# Patient Record
Sex: Female | Born: 1980 | Race: White | Hispanic: No | Marital: Married | Smoking: Current every day smoker
Health system: Southern US, Community
[De-identification: ages and names within clinical notes are randomized; demographics above are authoritative.]

## PROBLEM LIST (undated history)

## (undated) DIAGNOSIS — F32A Depression, unspecified: Secondary | ICD-10-CM

## (undated) DIAGNOSIS — T7840XA Allergy, unspecified, initial encounter: Secondary | ICD-10-CM

## (undated) DIAGNOSIS — K743 Primary biliary cirrhosis: Secondary | ICD-10-CM

## (undated) DIAGNOSIS — F200 Paranoid schizophrenia: Secondary | ICD-10-CM

## (undated) DIAGNOSIS — M358 Other specified systemic involvement of connective tissue: Secondary | ICD-10-CM

## (undated) DIAGNOSIS — G709 Myoneural disorder, unspecified: Secondary | ICD-10-CM

## (undated) DIAGNOSIS — L940 Localized scleroderma [morphea]: Secondary | ICD-10-CM

## (undated) DIAGNOSIS — F4310 Post-traumatic stress disorder, unspecified: Secondary | ICD-10-CM

## (undated) DIAGNOSIS — K219 Gastro-esophageal reflux disease without esophagitis: Secondary | ICD-10-CM

## (undated) DIAGNOSIS — F1010 Alcohol abuse, uncomplicated: Secondary | ICD-10-CM

## (undated) DIAGNOSIS — F319 Bipolar disorder, unspecified: Secondary | ICD-10-CM

## (undated) HISTORY — DX: Myoneural disorder, unspecified: G70.9

## (undated) HISTORY — PX: APPENDECTOMY: SHX54

## (undated) HISTORY — PX: HERNIA REPAIR: SHX51

## (undated) HISTORY — DX: Depression, unspecified: F32.A

## (undated) HISTORY — DX: Paranoid schizophrenia: F20.0

## (undated) HISTORY — PX: PARTIAL HYSTERECTOMY: SHX80

## (undated) HISTORY — DX: Allergy, unspecified, initial encounter: T78.40XA

## (undated) HISTORY — PX: ROUX-EN-Y GASTRIC BYPASS: SHX1104

---

## 2018-11-04 ENCOUNTER — Encounter: Payer: Self-pay | Admitting: Intensive Care

## 2018-11-04 ENCOUNTER — Emergency Department: Payer: Medicaid - Out of State

## 2018-11-04 ENCOUNTER — Other Ambulatory Visit: Payer: Self-pay

## 2018-11-04 ENCOUNTER — Emergency Department
Admission: EM | Admit: 2018-11-04 | Discharge: 2018-11-04 | Disposition: A | Discharge status: Home / Self Care | Payer: Medicaid - Out of State | Attending: Emergency Medicine | Admitting: Emergency Medicine

## 2018-11-04 DIAGNOSIS — F17210 Nicotine dependence, cigarettes, uncomplicated: Secondary | ICD-10-CM | POA: Diagnosis not present

## 2018-11-04 DIAGNOSIS — R1903 Right lower quadrant abdominal swelling, mass and lump: Secondary | ICD-10-CM | POA: Insufficient documentation

## 2018-11-04 DIAGNOSIS — R1031 Right lower quadrant pain: Secondary | ICD-10-CM | POA: Diagnosis present

## 2018-11-04 HISTORY — DX: Localized scleroderma (morphea): L94.0

## 2018-11-04 HISTORY — DX: Post-traumatic stress disorder, unspecified: F43.10

## 2018-11-04 HISTORY — DX: Bipolar disorder, unspecified: F31.9

## 2018-11-04 HISTORY — DX: Primary biliary cirrhosis: K74.3

## 2018-11-04 HISTORY — DX: Other specified systemic involvement of connective tissue: M35.8

## 2018-11-04 LAB — CBC WITH DIFFERENTIAL/PLATELET
Abs Immature Granulocytes: 0.02 10*3/uL (ref 0.00–0.07)
Basophils Absolute: 0.1 10*3/uL (ref 0.0–0.1)
Basophils Relative: 1 %
Eosinophils Absolute: 0.1 10*3/uL (ref 0.0–0.5)
Eosinophils Relative: 2 %
HCT: 41.6 % (ref 36.0–46.0)
Hemoglobin: 14.3 g/dL (ref 12.0–15.0)
Immature Granulocytes: 0 %
Lymphocytes Relative: 33 %
Lymphs Abs: 2.2 10*3/uL (ref 0.7–4.0)
MCH: 30.5 pg (ref 26.0–34.0)
MCHC: 34.4 g/dL (ref 30.0–36.0)
MCV: 88.7 fL (ref 80.0–100.0)
MONOS PCT: 9 %
Monocytes Absolute: 0.6 10*3/uL (ref 0.1–1.0)
Neutro Abs: 3.7 10*3/uL (ref 1.7–7.7)
Neutrophils Relative %: 55 %
Platelets: 247 10*3/uL (ref 150–400)
RBC: 4.69 MIL/uL (ref 3.87–5.11)
RDW: 12.2 % (ref 11.5–15.5)
WBC: 6.7 10*3/uL (ref 4.0–10.5)
nRBC: 0 % (ref 0.0–0.2)

## 2018-11-04 LAB — BASIC METABOLIC PANEL
Anion gap: 8 (ref 5–15)
BUN: 11 mg/dL (ref 6–20)
CO2: 25 mmol/L (ref 22–32)
Calcium: 8.5 mg/dL — ABNORMAL LOW (ref 8.9–10.3)
Chloride: 106 mmol/L (ref 98–111)
Creatinine, Ser: 0.65 mg/dL (ref 0.44–1.00)
GFR calc Af Amer: >60 mL/min (ref >60)
GLUCOSE: 96 mg/dL (ref 70–99)
Potassium: 4.5 mmol/L (ref 3.5–5.1)
Sodium: 139 mmol/L (ref 135–145)

## 2018-11-04 MED ORDER — IOPAMIDOL (ISOVUE-300) INJECTION 61%
100.0000 mL | Freq: Once | INTRAVENOUS | Status: AC | PRN
  Administered 2018-11-04: 100 mL via INTRAVENOUS
  Filled 2018-11-04: qty 100

## 2018-11-04 MED ORDER — ACETAMINOPHEN-CODEINE #3 300-30 MG PO TABS
1.0000 | ORAL_TABLET | Freq: Once | ORAL | Status: AC
  Administered 2018-11-04: 1 via ORAL
  Filled 2018-11-04: qty 1

## 2018-11-04 MED ORDER — CYCLOBENZAPRINE HCL 10 MG PO TABS: 10.0000 mg | ORAL_TABLET | Freq: Three times a day (TID) | ORAL | 0 refills | Status: DC | PRN

## 2018-11-04 MED ORDER — KETOROLAC TROMETHAMINE 30 MG/ML IJ SOLN
30.0000 mg | Freq: Once | INTRAMUSCULAR | Status: AC
  Administered 2018-11-04: 30 mg via INTRAVENOUS
  Filled 2018-11-04: qty 1

## 2018-11-04 MED ORDER — ACETAMINOPHEN-CODEINE #3 300-30 MG PO TABS: 1.0000 | ORAL_TABLET | Freq: Four times a day (QID) | ORAL | 0 refills | Status: DC | PRN

## 2018-11-04 NOTE — Discharge Instructions (Addendum)
The CT scan of your lower abdomen shows a calcification from your previous abdominal surgeries.   You should take flexeril, tylenol #3 as prescribed. You may also take regular tylenol instead of tylenol #3.  Follow up with primary care provider of your choice if not improving over the next few days.

## 2018-11-04 NOTE — ED Provider Notes (Signed)
[facility] Emergency Department Provider Note ____________________________________________  Time seen: Approximately 3:35 PM  I have reviewed the triage vital signs and the nursing notes.   HISTORY  Chief Complaint Groin Pain (right side)    HPI Katrina Turner is a 37 y.o. female who presents to the emergency department for evaluation and treatment of right lower abdominal pain x 2 months. She states that she often wears a backpack with a strap around her waist. She has noticed a "lump" in the right lower abdomen that has worsened over the past couple of days. No alleviating measures attempted prior to arrival.   Past Medical History:  Diagnosis Date  . Bipolar 1 disorder (HCC)   . PTSD (post-traumatic stress disorder)   . 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   History reviewed. No pertinent surgical history.  Prior to Admission medications   Medication Sig Start Date End Date Taking? Authorizing Provider  acetaminophen-codeine (TYLENOL #3) 300-30 MG tablet Take 1 tablet by mouth every 6 (six) hours as needed for moderate pain. 11/04/18   Tafari Humiston, Rulon Eisenmengerari B, FNP  cyclobenzaprine (FLEXERIL) 10 MG tablet Take 1 tablet (10 mg total) by mouth 3 (three) times daily as needed for muscle spasms. 11/04/18   Esty Ahuja, Rulon Eisenmengerari B, FNP    Allergies Depakote er [divalproex sodium er]; Nsaids; and Tegretol [carbamazepine]  History reviewed. No pertinent family history.  Social History Social History   Tobacco Use  . Smoking status: Current Every Day Smoker    Types: Cigarettes  . Smokeless tobacco: Never Used  Substance Use Topics  . Alcohol use: Yes    Comment: weekends  . Drug use: Never    Review of Systems Constitutional: Negative for fever. Cardiovascular: Negative for chest pain. Respiratory: Negative for shortness of breath. Musculoskeletal: Negative for extremity pain. Skin: Intact with old scar  across area of pain.  Neurological: Negative for decrease in sensation  ____________________________________________   PHYSICAL EXAM:  VITAL SIGNS: ED Triage Vitals [11/04/18 1452]  Enc Vitals Group     BP 123/84     Pulse Rate (!) 109     Resp 16     Temp 98.1 F (36.7 C)     Temp Source Oral     SpO2 98 %     Weight 190 lb (86.2 kg)     Height 5\' 7"  (1.702 m)     Head Circumference      Peak Flow      Pain Score 8     Pain Loc      Pain Edu?      Excl. in GC?     Constitutional: Alert and oriented. Well appearing and in no acute distress. Eyes: Conjunctivae are clear without discharge or drainage Head: Atraumatic Neck: Supple Respiratory: No cough. Respirations are even and unlabored. Musculoskeletal: FROM of all extremities observed. Neurologic: Awake, alert, oriented. Ambulatory without assistance.  Skin: Abdominal scar across right lower abdomen with nodular, non-mobile mass.  Psychiatric: Affect and behavior are appropriate.  ____________________________________________   LABS (all labs ordered are listed, but only abnormal results are displayed)  Labs Reviewed  BASIC METABOLIC PANEL - Abnormal; Notable for the following components:      Result Value   Calcium 8.5 (*)    All other components within normal limits  CBC WITH DIFFERENTIAL/PLATELET   ____________________________________________  RADIOLOGY  US non conclusive.   CT abdomen shows a nonspecific subcutaneous calcification  in the anterior pelvic wall, right of midline. ____________________________________________   PROCEDURES  Procedures  ____________________________________________   INITIAL IMPRESSION / ASSESSMENT AND PLAN / ED COURSE  Katrina Turner is a 37 y.o. who presents to the emergency department for who presents to the emergency department for treatment and evaluation of lower abdominal pain. 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 Plan to do [place] for confirmation.  ----------------------------------------- 4:40 PM on 11/04/2018 -----------------------------------------  [place] inconclusive. Will do CT. Patient and family aware and agree to the plan.  ----------------------------------------- 6:02 PM on 11/04/2018 -----------------------------------------  Results reviewed with the patient and family.  She will be prescribed Tylenol 3 and Flexeril.  She was encouraged to follow-up with primary care provider of her choice if symptoms are not improving over the next few days.  She is to return to the emergency department for symptoms change or worsen if unable to schedule an appointment.  Medications  ketorolac (TORADOL) 30 MG/ML injection 30 mg (30 mg Intravenous Given 11/04/18 1701)  iopamidol (ISOVUE-300) 61 % injection 100 mL (100 mLs Intravenous Contrast Given 11/04/18 1722)  acetaminophen-codeine (TYLENOL #3) 300-30 MG per tablet 1 tablet (1 tablet Oral Given 11/04/18 1758)    Pertinent labs & imaging results that were available during my care of the patient were reviewed by me and considered in my medical decision making (see chart for details).  _________________________________________   FINAL CLINICAL IMPRESSION(S) / ED DIAGNOSES  Final diagnoses:  Abdominal wall mass of right lower quadrant    ED Discharge Orders         Ordered    acetaminophen-codeine (TYLENOL #3) 300-30 MG tablet  Every 6 hours PRN     11/04/18 1757    cyclobenzaprine (FLEXERIL) 10 MG tablet  3 times daily PRN     11/04/18 1757           If controlled substance prescribed during this visit, 12 month history viewed on the [facility] prior to issuing an initial prescription for Schedule II or III opiod.    Chinita Pester, FNP 11/04/18 1803    Arnaldo Natal, MD 11/04/18 [phone]

## 2018-11-04 NOTE — ED Triage Notes (Signed)
Patient c/o Right sided groin pain X2 days that started while hiking.

## 2018-11-04 NOTE — ED Notes (Signed)
Pt presents with lower abdominal pain x 2 months. She states that she was hiking from [place] and picked up a backpack and it felt like something "tore." Pt describes pain as burning. NAD noted.

## 2018-11-04 NOTE — ED Notes (Signed)
Pt discharged home after verbalizing understanding of discharge instructions; nad noted. 

## 2018-11-22 ENCOUNTER — Encounter: Payer: Self-pay | Admitting: Emergency Medicine

## 2018-11-22 ENCOUNTER — Emergency Department
Admission: EM | Admit: 2018-11-22 | Discharge: 2018-11-22 | Disposition: A | Discharge status: Home / Self Care | Payer: PRIVATE HEALTH INSURANCE | Attending: Emergency Medicine | Admitting: Emergency Medicine

## 2018-11-22 ENCOUNTER — Emergency Department: Payer: PRIVATE HEALTH INSURANCE

## 2018-11-22 ENCOUNTER — Other Ambulatory Visit: Payer: Self-pay

## 2018-11-22 DIAGNOSIS — F17210 Nicotine dependence, cigarettes, uncomplicated: Secondary | ICD-10-CM | POA: Insufficient documentation

## 2018-11-22 DIAGNOSIS — J101 Influenza due to other identified influenza virus with other respiratory manifestations: Secondary | ICD-10-CM

## 2018-11-22 DIAGNOSIS — J09X2 Influenza due to identified novel influenza A virus with other respiratory manifestations: Secondary | ICD-10-CM | POA: Insufficient documentation

## 2018-11-22 DIAGNOSIS — Z79899 Other long term (current) drug therapy: Secondary | ICD-10-CM | POA: Diagnosis not present

## 2018-11-22 DIAGNOSIS — R509 Fever, unspecified: Secondary | ICD-10-CM | POA: Diagnosis present

## 2018-11-22 LAB — INFLUENZA PANEL BY PCR (TYPE A & B)
Influenza A By PCR: POSITIVE — AB
Influenza B By PCR: NEGATIVE

## 2018-11-22 MED ORDER — OSELTAMIVIR PHOSPHATE 75 MG PO CAPS: 75.0000 mg | ORAL_CAPSULE | Freq: Two times a day (BID) | ORAL | 0 refills | Status: AC

## 2018-11-22 MED ORDER — FLUTICASONE PROPIONATE 50 MCG/ACT NA SUSP: 2.0000 | Freq: Every day | NASAL | 0 refills | Status: DC

## 2018-11-22 MED ORDER — BENZONATATE 100 MG PO CAPS: ORAL_CAPSULE | ORAL | 0 refills | Status: DC

## 2018-11-22 NOTE — ED Triage Notes (Signed)
Fever, cough, diarrhea x 3 days.  Last medicated with tylenol at 0800.

## 2018-11-22 NOTE — Discharge Instructions (Signed)
You have tested positive for the flu. Take the prescription meds as directed. Take OTC Delsym for additional cough relief. Take Tylenol and Motrin for fever and body pain relief.

## 2018-11-22 NOTE — ED Notes (Signed)
See triage note  Presents with low grade fever,body aches diarrhea and drainage from eye  States she developed sx's on Friday and now things are getting worse

## 2018-11-23 NOTE — ED Provider Notes (Signed)
[facility] Emergency Department Provider Note ____________________________________________  Time seen: 1455  I have reviewed the triage vital signs and the nursing notes.  HISTORY  Chief Complaint  Fever and Cough  HPI Katrina Turner is a 37 y.o. female presents herself to the ED for evaluation of 3-day complaint of fever, cough, and diarrhea.  He has taken Tylenol for her symptoms but denies any overall relief.  She also reports generalized fatigue and body aches.  She is unclear of any sick contacts, recent travel, or other exposures.  She did not receive the seasonal flu vaccine.  Past Medical History:  Diagnosis Date  . Bipolar 1 disorder (HCC)   . PTSD (post-traumatic stress disorder)   . 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   History reviewed. No pertinent surgical history.  Prior to Admission medications   Medication Sig Start Date End Date Taking? Authorizing Provider  acetaminophen-codeine (TYLENOL #3) 300-30 MG tablet Take 1 tablet by mouth every 6 (six) hours as needed for moderate pain. 11/04/18   Chinita Pesterriplett, Cari B, FNP  benzonatate (TESSALON PERLES) 100 MG capsule Take 1-2 tabs TID prn cough 11/22/18   Vedika Dumlao, Charlesetta IvoryJenise V Bacon, PA-C  cyclobenzaprine (FLEXERIL) 10 MG tablet Take 1 tablet (10 mg total) by mouth 3 (three) times daily as needed for muscle spasms. 11/04/18   Triplett, Cari B, FNP  fluticasone (FLONASE) 50 MCG/ACT nasal spray Place 2 sprays into both nostrils daily. 11/22/18   Filomeno Cromley, Charlesetta IvoryJenise V Bacon, PA-C  oseltamivir (TAMIFLU) 75 MG capsule Take 1 capsule (75 mg total) by mouth 2 (two) times daily for 5 days. 11/22/18 11/27/18  Gael Delude, Charlesetta IvoryJenise V Bacon, PA-C    Allergies Depakote er [divalproex sodium er]; Nsaids; and Tegretol [carbamazepine]  No family history on file.  Social History Social History   Tobacco Use  . Smoking status: Current Every Day Smoker    Types: Cigarettes   . Smokeless tobacco: Never Used  Substance Use Topics  . Alcohol use: Yes    Comment: weekends  . Drug use: Never    Review of Systems  Constitutional: Positive for fever. Eyes: Negative for visual changes. ENT: Negative for sore throat. Cardiovascular: Negative for chest pain. Respiratory: Negative for shortness of breath. Gastrointestinal: Negative for abdominal pain, vomiting. Reports diarrhea. Genitourinary: Negative for dysuria. Musculoskeletal: Negative for back pain. Reports bodyaches.  Skin: Negative for rash. Neurological: Negative for headaches, focal weakness or numbness. ____________________________________________  PHYSICAL EXAM:  VITAL SIGNS: ED Triage Vitals  Enc Vitals Group     BP 11/22/18 1209 (!) 142/83     Pulse Rate 11/22/18 1209 92     Resp 11/22/18 1209 18     Temp 11/22/18 1209 99.3 F (37.4 C)     Temp Source 11/22/18 1209 Oral     SpO2 11/22/18 1209 94 %     Weight 11/22/18 1202 189 lb 9.5 oz (86 kg)     Height 11/22/18 1202 5\' 7"  (1.702 m)     Head Circumference --      Peak Flow --      Pain Score 11/22/18 1202 8     Pain Loc --      Pain Edu? --      Excl. in GC? --     Constitutional: Alert and oriented. Well appearing and in no distress. Head: Normocephalic and atraumatic. Eyes: Conjunctivae are normal. Normal extraocular movements Ears: Canals clear. TMs intact bilaterally.  Nose: No congestion/rhinorrhea/epistaxis. Mouth/Throat: Mucous membranes are moist. Neck: Supple. No thyromegaly. Cardiovascular: Normal rate, regular rhythm. Normal distal pulses. Respiratory: Normal respiratory effort. No wheezes/rales/rhonchi. Gastrointestinal: Soft and nontender. No distention. Musculoskeletal: Nontender with normal range of motion in all extremities.  Neurologic:  Normal gait without ataxia. Normal speech and language. No gross focal neurologic deficits are appreciated. ____________________________________________   LABS (pertinent  positives/negatives) Labs Reviewed  INFLUENZA PANEL BY PCR (TYPE A & B) - Abnormal; Notable for the following components:      Result Value   Influenza A By PCR POSITIVE (*)    All other components within normal limits  ____________________________________________   RADIOLOGY  CXR IMPRESSION: No active cardiopulmonary disease. ____________________________________________  PROCEDURES  Procedures ____________________________________________  INITIAL IMPRESSION / ASSESSMENT AND PLAN / ED COURSE  Patient with ED evaluation of symptoms concerning for influenza.  Her influenza screen is positive for influenza A.  She is treated with Tessalon Perles, Flonase, and Tamiflu.  She will follow-up with primary provider or return to the ED as needed. ____________________________________________  FINAL CLINICAL IMPRESSION(S) / ED DIAGNOSES  Final diagnoses:  Influenza A      Karmen Stabs, Charlesetta Ivory, PA-C 11/23/18 1856    Don Perking, [place], MD 11/25/18 1459

## 2018-11-29 ENCOUNTER — Emergency Department
Admission: EM | Admit: 2018-11-29 | Discharge: 2018-11-29 | Disposition: A | Discharge status: Home / Self Care | Payer: PRIVATE HEALTH INSURANCE | Attending: Emergency Medicine | Admitting: Emergency Medicine

## 2018-11-29 ENCOUNTER — Other Ambulatory Visit: Payer: Self-pay

## 2018-11-29 DIAGNOSIS — M79642 Pain in left hand: Secondary | ICD-10-CM | POA: Diagnosis not present

## 2018-11-29 DIAGNOSIS — F17210 Nicotine dependence, cigarettes, uncomplicated: Secondary | ICD-10-CM | POA: Diagnosis not present

## 2018-11-29 DIAGNOSIS — M79641 Pain in right hand: Secondary | ICD-10-CM | POA: Diagnosis present

## 2018-11-29 DIAGNOSIS — I7300 Raynaud's syndrome without gangrene: Secondary | ICD-10-CM

## 2018-11-29 MED ORDER — AMLODIPINE BESYLATE 5 MG PO TABS: 5.0000 mg | ORAL_TABLET | Freq: Every day | ORAL | 2 refills | Status: DC

## 2018-11-29 MED ORDER — AMLODIPINE BESYLATE 5 MG PO TABS
5.0000 mg | ORAL_TABLET | Freq: Once | ORAL | Status: AC
  Administered 2018-11-29: 5 mg via ORAL
  Filled 2018-11-29: qty 1

## 2018-11-29 NOTE — ED Notes (Signed)
FIRST NURSE NOTE: PT reports numbness to hands, pt has Raynaud's, pt tearful in triage pt states she has Lupus.

## 2018-11-29 NOTE — ED Notes (Signed)
See triage note. Pt states both hands constricted. Bilat pulse 2+.

## 2018-11-29 NOTE — Discharge Instructions (Addendum)
[facility] if you remain in [place].  Return to any emergency department if worsening.  Take the medications as prescribed.

## 2018-11-29 NOTE — ED Triage Notes (Addendum)
Pt states she has raynauds and has been having BL hand cramps since 10am today.

## 2018-11-29 NOTE — ED Provider Notes (Signed)
[facility] Emergency Department Provider Note  ____________________________________________   First MD Initiated Contact with Patient 11/29/18 1537     (approximate)  I have reviewed the triage vital signs and the nursing notes.   HISTORY  Chief Complaint Hand Pain    HPI Katrina Turner is a 37 y.o. female since emergency department complaining of bilateral hand pain due to her rainouts.  She states she has been hiking all the way from [place].  She has been seen here multiple times.  She is homeless.  She states her hands are constricted today.    Past Medical History:  Diagnosis Date  . Bipolar 1 disorder (HCC)   . PTSD (post-traumatic stress disorder)   . 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   History reviewed. No pertinent surgical history.  Prior to Admission medications   Medication Sig Start Date End Date Taking? Authorizing Provider  amLODipine (NORVASC) 5 MG tablet Take 1 tablet (5 mg total) by mouth daily. 11/29/18   Sherrie MustacheFisher, Roselyn BeringSusan W, PA-C  benzonatate (TESSALON PERLES) 100 MG capsule Take 1-2 tabs TID prn cough 11/22/18   Menshew, Charlesetta IvoryJenise V Bacon, PA-C  fluticasone (FLONASE) 50 MCG/ACT nasal spray Place 2 sprays into both nostrils daily. 11/22/18   Menshew, Charlesetta IvoryJenise V Bacon, PA-C    Allergies Depakote er [divalproex sodium er]; Nsaids; and Tegretol [carbamazepine]  No family history on file.  Social History Social History   Tobacco Use  . Smoking status: Current Every Day Smoker    Types: Cigarettes  . Smokeless tobacco: Never Used  Substance Use Topics  . Alcohol use: Yes    Comment: weekends  . Drug use: Never    Review of Systems  Constitutional: No fever/chills Eyes: No visual changes. ENT: No sore throat. Respiratory: Denies cough Genitourinary: Negative for dysuria. Musculoskeletal: Negative for back pain.  Bilateral hand pain Skin: Negative for  rash.    ____________________________________________   PHYSICAL EXAM:  VITAL SIGNS: ED Triage Vitals [11/29/18 1525]  Enc Vitals Group     BP 131/86     Pulse Rate 79     Resp 18     Temp 97.7 F (36.5 C)     Temp Source Oral     SpO2 99 %     Weight 180 lb (81.6 kg)     Height 5\' 7"  (1.702 m)     Head Circumference      Peak Flow      Pain Score 10     Pain Loc      Pain Edu?      Excl. in GC?     Constitutional: Alert and oriented. Well appearing and in no acute distress. Eyes: Conjunctivae are normal.  Head: Atraumatic. Nose: No congestion/rhinnorhea. Mouth/Throat: Mucous membranes are moist.   Neck:  supple no lymphadenopathy noted Cardiovascular: Normal rate, regular rhythm. Heart sounds are normal Respiratory: Normal respiratory effort.  No retractions, lungs c t a  GU: deferred Musculoskeletal: FROM all extremities, warm and well perfused, hands are red in the fingertips feel coldness typical of raynauds Neurologic:  Normal speech and language.  Skin:  Skin is warm, dry and intact. No rash noted. Psychiatric: Mood and affect are normal. Speech and behavior are normal.  ____________________________________________   LABS (all labs ordered are listed, but only abnormal results are displayed)  Labs Reviewed - No data to display ____________________________________________   ____________________________________________  RADIOLOGY  ____________________________________________   PROCEDURES  Procedure(s) performed: No  Procedures    ____________________________________________   INITIAL IMPRESSION / ASSESSMENT AND PLAN / ED COURSE  Pertinent labs & imaging results that were available during my care of the patient were reviewed by me and considered in my medical decision making (see chart for details).   Patient is 37 year old homeless female.  She presents emergency department complaining of bilateral hand pain.  Patient's hands are  contracted, area is red and the fingers feel cold.  Discussed findings with the patient.  She has history of raynaud's.  She will be started on amlodipine and [facility] states she understands will comply.  Was given a prescription for amlodipine 5 mg p.o. daily.  She is discharged stable condition.     As part of my medical decision making, I reviewed the following data within the electronic MEDICAL RECORD NUMBER History obtained from family, Nursing notes reviewed and incorporated, Old chart reviewed, Notes from prior ED visits and [place] Controlled Substance Database  ____________________________________________   FINAL CLINICAL IMPRESSION(S) / ED DIAGNOSES  Final diagnoses:  Raynaud's disease without gangrene      NEW MEDICATIONS STARTED DURING THIS VISIT:  Discharge Medication List as of 11/29/2018  3:49 PM    START taking these medications   Details  amLODipine (NORVASC) 5 MG tablet Take 1 tablet (5 mg total) by mouth daily., Starting Mon 11/29/2018, Normal         Note:  This document was prepared using Dragon voice recognition software and may include unintentional dictation errors.    Faythe GheeFisher, Tommey Barret W, PA-C 11/29/18 1608    Dionne BucySiadecki, [place], MD 11/29/18 [phone]

## 2019-03-03 DIAGNOSIS — M329 Systemic lupus erythematosus, unspecified: Secondary | ICD-10-CM | POA: Insufficient documentation

## 2019-03-03 DIAGNOSIS — IMO0002 Reserved for concepts with insufficient information to code with codable children: Secondary | ICD-10-CM | POA: Insufficient documentation

## 2019-03-03 DIAGNOSIS — I7300 Raynaud's syndrome without gangrene: Secondary | ICD-10-CM | POA: Insufficient documentation

## 2019-03-03 DIAGNOSIS — R109 Unspecified abdominal pain: Secondary | ICD-10-CM | POA: Insufficient documentation

## 2019-03-04 DIAGNOSIS — F17200 Nicotine dependence, unspecified, uncomplicated: Secondary | ICD-10-CM | POA: Insufficient documentation

## 2019-05-05 ENCOUNTER — Emergency Department: Payer: Medicaid - Out of State

## 2019-05-05 ENCOUNTER — Encounter: Payer: Self-pay | Admitting: Intensive Care

## 2019-05-05 ENCOUNTER — Emergency Department
Admission: EM | Admit: 2019-05-05 | Discharge: 2019-05-05 | Disposition: A | Discharge status: Home / Self Care | Payer: Medicaid - Out of State | Attending: Emergency Medicine | Admitting: Emergency Medicine

## 2019-05-05 ENCOUNTER — Other Ambulatory Visit: Payer: Self-pay

## 2019-05-05 DIAGNOSIS — U071 COVID-19: Secondary | ICD-10-CM | POA: Diagnosis not present

## 2019-05-05 DIAGNOSIS — F17210 Nicotine dependence, cigarettes, uncomplicated: Secondary | ICD-10-CM | POA: Insufficient documentation

## 2019-05-05 DIAGNOSIS — Z79899 Other long term (current) drug therapy: Secondary | ICD-10-CM | POA: Diagnosis not present

## 2019-05-05 DIAGNOSIS — R109 Unspecified abdominal pain: Secondary | ICD-10-CM | POA: Diagnosis present

## 2019-05-05 DIAGNOSIS — R1011 Right upper quadrant pain: Secondary | ICD-10-CM | POA: Insufficient documentation

## 2019-05-05 LAB — COMPREHENSIVE METABOLIC PANEL
ALT: 118 U/L — ABNORMAL HIGH (ref 0–44)
AST: 149 U/L — ABNORMAL HIGH (ref 15–41)
Albumin: 3.9 g/dL (ref 3.5–5.0)
Alkaline Phosphatase: 93 U/L (ref 38–126)
Anion gap: 15 (ref 5–15)
BUN: 5 mg/dL — ABNORMAL LOW (ref 6–20)
CO2: 19 mmol/L — ABNORMAL LOW (ref 22–32)
Calcium: 8.8 mg/dL — ABNORMAL LOW (ref 8.9–10.3)
Chloride: 106 mmol/L (ref 98–111)
Creatinine, Ser: 0.49 mg/dL (ref 0.44–1.00)
GFR calc Af Amer: >60 mL/min (ref >60)
GFR calc non Af Amer: >60 mL/min (ref >60)
Glucose, Bld: 105 mg/dL — ABNORMAL HIGH (ref 70–99)
Potassium: 3.7 mmol/L (ref 3.5–5.1)
Sodium: 140 mmol/L (ref 135–145)
Total Bilirubin: 0.5 mg/dL (ref 0.3–1.2)
Total Protein: 7.5 g/dL (ref 6.5–8.1)

## 2019-05-05 LAB — URINALYSIS, COMPLETE (UACMP) WITH MICROSCOPIC
Bacteria, UA: NONE SEEN
Bilirubin Urine: NEGATIVE
Glucose, UA: NEGATIVE mg/dL
Hgb urine dipstick: NEGATIVE
Ketones, ur: NEGATIVE mg/dL
Leukocytes,Ua: NEGATIVE
Nitrite: NEGATIVE
Protein, ur: NEGATIVE mg/dL
Specific Gravity, Urine: 1.011 (ref 1.005–1.030)
pH: 6 (ref 5.0–8.0)

## 2019-05-05 LAB — CBC
HCT: 43 % (ref 36.0–46.0)
Hemoglobin: 15.3 g/dL — ABNORMAL HIGH (ref 12.0–15.0)
MCH: 32.3 pg (ref 26.0–34.0)
MCHC: 35.6 g/dL (ref 30.0–36.0)
MCV: 90.9 fL (ref 80.0–100.0)
Platelets: 152 10*3/uL (ref 150–400)
RBC: 4.73 MIL/uL (ref 3.87–5.11)
RDW: 15.1 % (ref 11.5–15.5)
WBC: 4 10*3/uL (ref 4.0–10.5)
nRBC: 0 % (ref 0.0–0.2)

## 2019-05-05 LAB — TROPONIN I: Troponin I: <0.03 ng/mL (ref <0.03)

## 2019-05-05 LAB — LIPASE, BLOOD: Lipase: 59 U/L — ABNORMAL HIGH (ref 11–51)

## 2019-05-05 MED ORDER — SODIUM CHLORIDE 0.9 % IV BOLUS
1000.0000 mL | Freq: Once | INTRAVENOUS | Status: AC
  Administered 2019-05-05: 1000 mL via INTRAVENOUS

## 2019-05-05 MED ORDER — ONDANSETRON 4 MG PO TBDP: 4.0000 mg | ORAL_TABLET | Freq: Three times a day (TID) | ORAL | 0 refills | Status: DC | PRN

## 2019-05-05 MED ORDER — MORPHINE SULFATE (PF) 4 MG/ML IV SOLN
4.0000 mg | Freq: Once | INTRAVENOUS | Status: AC
  Administered 2019-05-05: 4 mg via INTRAVENOUS
  Filled 2019-05-05: qty 1

## 2019-05-05 MED ORDER — ONDANSETRON HCL 4 MG/2ML IJ SOLN
4.0000 mg | Freq: Once | INTRAMUSCULAR | Status: AC
  Administered 2019-05-05: 4 mg via INTRAVENOUS
  Filled 2019-05-05: qty 2

## 2019-05-05 MED ORDER — HYDROCODONE-ACETAMINOPHEN 5-325 MG PO TABS: 1.0000 | ORAL_TABLET | ORAL | 0 refills | Status: DC | PRN

## 2019-05-05 MED ORDER — LORAZEPAM 2 MG/ML IJ SOLN
1.0000 mg | Freq: Once | INTRAMUSCULAR | Status: AC
  Administered 2019-05-05: 15:00:00 1 mg via INTRAVENOUS
  Filled 2019-05-05: qty 1

## 2019-05-05 NOTE — ED Triage Notes (Signed)
Patient arrived by EMS from home for RUQ pain. Patient scheduled to have Gallbladder removed tomorrow by [facility] but got cancelled due to Covid swab coming back positive. Patients PCP told her to come to Er to be evaluated since she has been having N/V/D. Patient reports she has had sore throat, headache, and N/V/D but thought it was the flu again like she had months ago. A&O x4 during triage.

## 2019-05-05 NOTE — ED Notes (Signed)
Pt's husband states he will call back in 10 mins to let pt know if he can find a ride.

## 2019-05-05 NOTE — ED Notes (Signed)
Pt leaving through side door near room 11, mask in place. Verified friend here to pick pt up prior to discharge. Pt discharged in stable condition per EDP.

## 2019-05-05 NOTE — ED Notes (Signed)
Patient's discharge and follow up information reviewed with patient by ED nursing staff and patient given the opportunity to ask questions pertaining to ED visit and discharge plan of care. Patient advised that should symptoms not continue to improve, resolve entirely, or should new symptoms develop then a follow up visit with their PCP or a return visit to the ED may be warranted. Patient verbalized consent and understanding of discharge plan of care including potential need for further evaluation. Patient discharged in stable condition per attending ED physician on duty.   Pt awaiting ride home to arrive. Will bring pt to side door when ride arrives d/t positive COVID-19 results.

## 2019-05-05 NOTE — Discharge Instructions (Addendum)
Person Under Monitoring Name: Katrina Turner  Location: [address] [place] [address]   Infection Prevention Recommendations for Individuals Confirmed to have, or Being Evaluated for, 2019 Novel Coronavirus (COVID-19) Infection Who Receive Care at Home  Individuals who are confirmed to have, or are being evaluated for, COVID-19 should follow the prevention steps below until a healthcare provider or local or state health department says they can return to normal activities.  Stay home except to get medical care You should restrict activities outside your home, except for getting medical care. Do not go to work, school, or public areas, and do not use public transportation or taxis.  Call ahead before visiting your doctor Before your medical appointment, call the healthcare provider and tell them that you have, or are being evaluated for, COVID-19 infection. This will help the healthcare providers office take steps to keep other people from getting infected. Ask your healthcare provider to call the local or state health department.  Monitor your symptoms Seek prompt medical attention if your illness is worsening (e.g., difficulty breathing). Before going to your medical appointment, call the healthcare provider and tell them that you have, or are being evaluated for, COVID-19 infection. Ask your healthcare provider to call the local or state health department.  Wear a facemask You should wear a facemask that covers your nose and mouth when you are in the same room with other people and when you visit a healthcare provider. People who live with or visit you should also wear a facemask while they are in the same room with you.  Separate yourself from other people in your home As much as possible, you should stay in a different room from other people in your home. Also, you should use a separate bathroom, if available.  Avoid sharing household items You should not share dishes,  drinking glasses, cups, eating utensils, towels, bedding, or other items with other people in your home. After using these items, you should wash them thoroughly with soap and water.  Cover your coughs and sneezes Cover your mouth and nose with a tissue when you cough or sneeze, or you can cough or sneeze into your sleeve. Throw used tissues in a lined trash can, and immediately wash your hands with soap and water for at least 20 seconds or use an alcohol-based hand rub.  Wash your [employer] your hands often and thoroughly with soap and water for at least 20 seconds. You can use an alcohol-based hand sanitizer if soap and water are not available and if your hands are not visibly dirty. Avoid touching your eyes, nose, and mouth with unwashed hands.   Prevention Steps for Caregivers and Household Members of Individuals Confirmed to have, or Being Evaluated for, COVID-19 Infection Being Cared for in the Home  If you live with, or provide care at home for, a person confirmed to have, or being evaluated for, COVID-19 infection please follow these guidelines to prevent infection:  Follow healthcare providers instructions Make sure that you understand and can help the patient follow any healthcare provider instructions for all care.  Provide for the patients basic needs You should help the patient with basic needs in the home and provide support for getting groceries, prescriptions, and other personal needs.  Monitor the patients symptoms If they are getting sicker, call his or her medical provider and tell them that the patient has, or is being evaluated for, COVID-19 infection. This will help the healthcare providers office  take steps to keep other people from getting infected. Ask the healthcare provider to call the local or state health department.  Limit the number of people who have contact with the patient If possible, have only one caregiver for the patient. Other household  members should stay in another home or place of residence. If this is not possible, they should stay in another room, or be separated from the patient as much as possible. Use a separate bathroom, if available. Restrict visitors who do not have an essential need to be in the home.  Keep older adults, very young children, and other sick people away from the patient Keep older adults, very young children, and those who have compromised immune systems or chronic health conditions away from the patient. This includes people with chronic heart, lung, or kidney conditions, diabetes, and cancer.  Ensure good ventilation Make sure that shared spaces in the home have good air flow, such as from an air conditioner or an opened window, weather permitting.  Wash your hands often Wash your hands often and thoroughly with soap and water for at least 20 seconds. You can use an alcohol based hand sanitizer if soap and water are not available and if your hands are not visibly dirty. Avoid touching your eyes, nose, and mouth with unwashed hands. Use disposable paper towels to dry your hands. If not available, use dedicated cloth towels and replace them when they become wet.  Wear a facemask and gloves Wear a disposable facemask at all times in the room and gloves when you touch or have contact with the patients blood, body fluids, and/or secretions or excretions, such as sweat, saliva, sputum, nasal mucus, vomit, urine, or feces.  Ensure the mask fits over your nose and mouth tightly, and do not touch it during use. Throw out disposable facemasks and gloves after using them. Do not reuse. Wash your hands immediately after removing your facemask and gloves. If your personal clothing becomes contaminated, carefully remove clothing and launder. Wash your hands after handling contaminated clothing. Place all used disposable facemasks, gloves, and other waste in a lined container before disposing them with other  household waste. Remove gloves and wash your hands immediately after handling these items.  Do not share dishes, glasses, or other household items with the patient Avoid sharing household items. You should not share dishes, drinking glasses, cups, eating utensils, towels, bedding, or other items with a patient who is confirmed to have, or being evaluated for, COVID-19 infection. After the person uses these items, you should wash them thoroughly with soap and water.  Wash laundry thoroughly Immediately remove and wash clothes or bedding that have blood, body fluids, and/or secretions or excretions, such as sweat, saliva, sputum, nasal mucus, vomit, urine, or feces, on them. Wear gloves when handling laundry from the patient. Read and follow directions on labels of laundry or clothing items and detergent. In general, wash and dry with the warmest temperatures recommended on the label.  Clean all areas the individual has used often Clean all touchable surfaces, such as counters, tabletops, doorknobs, bathroom fixtures, toilets, phones, keyboards, tablets, and bedside tables, every day. Also, clean any surfaces that may have blood, body fluids, and/or secretions or excretions on them. Wear gloves when cleaning surfaces the patient has come in contact with. Use a diluted bleach solution (e.g., dilute bleach with 1 part bleach and 10 parts water) or a household disinfectant with a label that says EPA-registered for coronaviruses. To make a bleach  solution at home, add 1 tablespoon of bleach to 1 quart (4 cups) of water. For a larger supply, add  cup of bleach to 1 gallon (16 cups) of water. Read labels of cleaning products and follow recommendations provided on product labels. Labels contain instructions for safe and effective use of the cleaning product including precautions you should take when applying the product, such as wearing gloves or eye protection and making sure you have good ventilation  during use of the product. Remove gloves and wash hands immediately after cleaning.  Monitor yourself for signs and symptoms of illness Caregivers and household members are considered close contacts, should monitor their health, and will be asked to limit movement outside of the home to the extent possible. Follow the monitoring steps for close contacts listed on the symptom monitoring form.   ? If you have additional questions, contact your local health department or call the epidemiologist on call at [phone] (available 24/7). ? This guidance is subject to change. For the most up-to-date guidance from [facility], please refer to their website: [email or web address]

## 2019-05-05 NOTE — ED Provider Notes (Signed)
[facility] Emergency Department Provider Note  Time seen: 1:03 PM  I have reviewed the triage vital signs and the nursing notes.   HISTORY  Chief Complaint Abdominal Pain   HPI Katrina Turner is a 38 y.o. female with a past medical history of bipolar, PTSD, presents emergency department for abdominal pain, weakness, nausea vomiting and fever.  According to the patient for the past 2 months she has been experiencing pain in her right upper quadrant.  Had an MRCP, then an ERCP with a stent placed.  Patient was supposed to have her gallbladder removed tomorrow at [facility], however over the past for 5 days the patient became weak, febrile with generalized body aches cough and shortness of breath.  Patient tested positive for coronavirus, and the gallbladder surgery was pushed off.  Patient states she is having right upper quadrant pain once again today although states it has been fairly constant over the past 2 months, states it has been worse over the past 3 to 4 days since the coronavirus symptoms started.  Currently afebrile.  Mild distress due to generalized pain and body aches.  Past Medical History:  Diagnosis Date  . Bipolar 1 disorder (HCC)   . PTSD (post-traumatic stress disorder)   . Reynolds syndrome [facility])     There are no active problems to display for this patient.   History reviewed. No pertinent surgical history.  Prior to Admission medications   Medication Sig Start Date End Date Taking? Authorizing Provider  amLODipine (NORVASC) 5 MG tablet Take 1 tablet (5 mg total) by mouth daily. 11/29/18   Sherrie Mustache, Roselyn Bering, PA-C  benzonatate (TESSALON PERLES) 100 MG capsule Take 1-2 tabs TID prn cough 11/22/18   Menshew, Charlesetta Ivory, PA-C  fluticasone (FLONASE) 50 MCG/ACT nasal spray Place 2 sprays into both nostrils daily. 11/22/18   Menshew, Charlesetta Ivory, PA-C    Allergies  Allergen Reactions  . Bee Venom   . Depakote Er [Divalproex Sodium Er]    . Ivp Dye [Iodinated Diagnostic Agents]   . Nsaids   . Tegretol [Carbamazepine]     History reviewed. No pertinent family history.  Social History Social History   Tobacco Use  . Smoking status: Current Every Day Smoker    Types: Cigarettes  . Smokeless tobacco: Never Used  Substance Use Topics  . Alcohol use: Yes    Alcohol/week: 35.0 standard drinks    Types: 35 Cans of beer per week    Comment: 6 pack a day  . Drug use: Never    Review of Systems Constitutional: Positive for fever at home x3 to 4 days ENT: Mild congestion. Cardiovascular: Negative for chest pain. Respiratory: Positive for shortness of breath with occasional cough Gastrointestinal: Positive for abdominal pain worse in the right upper quadrant.  Positive for nausea and dry heaving.  History of gastric bypass and states she does not vomit. Genitourinary: Negative for urinary compaints Musculoskeletal: Generalized body aches Skin: Negative for skin complaints  Neurological: Intermittent headache All other ROS negative  ____________________________________________   PHYSICAL EXAM:  VITAL SIGNS: ED Triage Vitals  Enc Vitals Group     BP 05/05/19 1057 (!) 161/124     Pulse Rate 05/05/19 1057 88     Resp 05/05/19 1057 18     Temp 05/05/19 1057 98.1 F (36.7 C)     Temp Source 05/05/19 1057 Oral     SpO2 05/05/19 1057 96 %     Weight 05/05/19 1059  160 lb (72.6 kg)     Height 05/05/19 1059 5' 7.75" (1.721 m)     Head Circumference --      Peak Flow --      Pain Score 05/05/19 1059 10     Pain Loc --      Pain Edu? --      Excl. in GC? --    Constitutional: Alert and oriented.  Mild distress due to generalized fatigue and aches. Eyes: Normal exam ENT      Head: Normocephalic and atraumatic.      Mouth/Throat: Mucous membranes are moist. Cardiovascular: Normal rate, regular rhythm.  Respiratory: Normal respiratory effort without tachypnea nor retractions. Breath sounds are clear.  Occasional  cough during exam. Gastrointestinal: Soft, moderate right upper quadrant tenderness.  No rebound guarding or distention. Musculoskeletal: Nontender with normal range of motion in all extremities. No lower extremity tenderness  Neurologic:  Normal speech and language. No gross focal neurologic deficits  Skin:  Skin is warm, dry and intact.  Psychiatric: Mood and affect are normal.   ____________________________________________    EKG  EKG viewed and interpreted by myself shows sinus rhythm at 89 bpm with a narrow QRS, normal axis, normal intervals.  Nonspecific ST changes present.  No ST elevation.  ____________________________________________    RADIOLOGY  Sound shows sludge but no concerning acute abnormalities.  ____________________________________________   INITIAL IMPRESSION / ASSESSMENT AND PLAN / ED COURSE  Pertinent labs & imaging results that were available during my care of the patient were reviewed by me and considered in my medical decision making (see chart for details).   Patient presents to the emergency department with generalized body aches weakness fever worsening right upper quadrant abdominal pain.  Differential would include gallbladder pathology such as cholecystitis, COVID symptoms, viral illness.  We will check labs, treat pain and nausea and continue to closely monitor.  Patient's labs have resulted showing elevated LFTs as well as an anion gap.  We will continue with IV hydration, obtain a right upper quadrant ultrasound and continue to closely monitor.  Ultrasound shows no acute concerning abnormalities.  Patient states she is feeling much better.  We will discharge with a short course of pain medication.  Patient is somewhat tremulous, admits to alcohol on a daily basis is not drinking in 2 days.  We will dose a one-time dose of oral Ativan.  No significant withdrawal symptoms noted.  I discussed return precautions, patient agreeable to plan of  care.  Katrina Turner was evaluated in Emergency Department on 05/05/2019 for the symptoms described in the history of present illness. She was evaluated in the context of the global COVID-19 pandemic, which necessitated consideration that the patient might be at risk for infection with the SARS-CoV-2 virus that causes COVID-19. Institutional protocols and algorithms that pertain to the evaluation of patients at risk for COVID-19 are in a state of rapid change based on information released by regulatory bodies including the CDC and federal and state organizations. These policies and algorithms were followed during the patient's care in the ED.  ____________________________________________   FINAL CLINICAL IMPRESSION(S) / ED DIAGNOSES  Right upper quadrant abdominal pain COVID-19   Minna AntisPaduchowski, [place]any [place], MD 05/05/19 1439

## 2019-05-24 ENCOUNTER — Other Ambulatory Visit: Payer: Self-pay

## 2019-05-24 ENCOUNTER — Emergency Department: Payer: Medicaid - Out of State

## 2019-05-24 ENCOUNTER — Encounter: Payer: Self-pay | Admitting: Emergency Medicine

## 2019-05-24 ENCOUNTER — Observation Stay
Admission: EM | Admit: 2019-05-24 | Discharge: 2019-05-26 | DRG: 439 | Discharge status: Against Medical Advice | Payer: Medicaid - Out of State | Attending: Internal Medicine | Admitting: Internal Medicine

## 2019-05-24 DIAGNOSIS — K852 Alcohol induced acute pancreatitis without necrosis or infection: Secondary | ICD-10-CM

## 2019-05-24 DIAGNOSIS — Z9884 Bariatric surgery status: Secondary | ICD-10-CM | POA: Diagnosis not present

## 2019-05-24 DIAGNOSIS — Z1159 Encounter for screening for other viral diseases: Secondary | ICD-10-CM

## 2019-05-24 DIAGNOSIS — Z7289 Other problems related to lifestyle: Secondary | ICD-10-CM

## 2019-05-24 DIAGNOSIS — F4310 Post-traumatic stress disorder, unspecified: Secondary | ICD-10-CM | POA: Diagnosis present

## 2019-05-24 DIAGNOSIS — F17200 Nicotine dependence, unspecified, uncomplicated: Secondary | ICD-10-CM | POA: Diagnosis present

## 2019-05-24 DIAGNOSIS — R1013 Epigastric pain: Secondary | ICD-10-CM | POA: Diagnosis present

## 2019-05-24 DIAGNOSIS — F319 Bipolar disorder, unspecified: Secondary | ICD-10-CM | POA: Diagnosis present

## 2019-05-24 DIAGNOSIS — Z789 Other specified health status: Secondary | ICD-10-CM

## 2019-05-24 DIAGNOSIS — Y908 Blood alcohol level of 240 mg/100 ml or more: Secondary | ICD-10-CM | POA: Diagnosis present

## 2019-05-24 DIAGNOSIS — F1090 Alcohol use, unspecified, uncomplicated: Secondary | ICD-10-CM

## 2019-05-24 DIAGNOSIS — Z79899 Other long term (current) drug therapy: Secondary | ICD-10-CM

## 2019-05-24 DIAGNOSIS — R112 Nausea with vomiting, unspecified: Secondary | ICD-10-CM

## 2019-05-24 DIAGNOSIS — K859 Acute pancreatitis without necrosis or infection, unspecified: Principal | ICD-10-CM | POA: Diagnosis present

## 2019-05-24 DIAGNOSIS — F10239 Alcohol dependence with withdrawal, unspecified: Secondary | ICD-10-CM | POA: Diagnosis present

## 2019-05-24 DIAGNOSIS — R2689 Other abnormalities of gait and mobility: Secondary | ICD-10-CM | POA: Diagnosis present

## 2019-05-24 DIAGNOSIS — Z5329 Procedure and treatment not carried out because of patient's decision for other reasons: Secondary | ICD-10-CM | POA: Diagnosis not present

## 2019-05-24 LAB — URINALYSIS, COMPLETE (UACMP) WITH MICROSCOPIC
Bilirubin Urine: NEGATIVE
Glucose, UA: NEGATIVE mg/dL
Hgb urine dipstick: NEGATIVE
Ketones, ur: NEGATIVE mg/dL
Leukocytes,Ua: NEGATIVE
Nitrite: POSITIVE — AB
Protein, ur: NEGATIVE mg/dL
Specific Gravity, Urine: 1.003 — ABNORMAL LOW (ref 1.005–1.030)
pH: 5 (ref 5.0–8.0)

## 2019-05-24 LAB — COMPREHENSIVE METABOLIC PANEL
ALT: 28 U/L (ref 0–44)
AST: 34 U/L (ref 15–41)
Albumin: 4.2 g/dL (ref 3.5–5.0)
Alkaline Phosphatase: 87 U/L (ref 38–126)
Anion gap: 11 (ref 5–15)
BUN: 6 mg/dL (ref 6–20)
CO2: 23 mmol/L (ref 22–32)
Calcium: 8.5 mg/dL — ABNORMAL LOW (ref 8.9–10.3)
Chloride: 108 mmol/L (ref 98–111)
Creatinine, Ser: 0.52 mg/dL (ref 0.44–1.00)
GFR calc Af Amer: >60 mL/min (ref >60)
GFR calc non Af Amer: >60 mL/min (ref >60)
Glucose, Bld: 81 mg/dL (ref 70–99)
Potassium: 4 mmol/L (ref 3.5–5.1)
Sodium: 142 mmol/L (ref 135–145)
Total Bilirubin: 0.5 mg/dL (ref 0.3–1.2)
Total Protein: 7.7 g/dL (ref 6.5–8.1)

## 2019-05-24 LAB — CBC WITH DIFFERENTIAL/PLATELET
Abs Immature Granulocytes: 0.01 10*3/uL (ref 0.00–0.07)
Basophils Absolute: 0.1 10*3/uL (ref 0.0–0.1)
Basophils Relative: 2 %
Eosinophils Absolute: 0.1 10*3/uL (ref 0.0–0.5)
Eosinophils Relative: 2 %
HCT: 41.7 % (ref 36.0–46.0)
Hemoglobin: 14.5 g/dL (ref 12.0–15.0)
Immature Granulocytes: 0 %
Lymphocytes Relative: 41 %
Lymphs Abs: 2.3 10*3/uL (ref 0.7–4.0)
MCH: 33.3 pg (ref 26.0–34.0)
MCHC: 34.8 g/dL (ref 30.0–36.0)
MCV: 95.9 fL (ref 80.0–100.0)
Monocytes Absolute: 0.5 10*3/uL (ref 0.1–1.0)
Monocytes Relative: 9 %
Neutro Abs: 2.6 10*3/uL (ref 1.7–7.7)
Neutrophils Relative %: 46 %
Platelets: 299 10*3/uL (ref 150–400)
RBC: 4.35 MIL/uL (ref 3.87–5.11)
RDW: 15.6 % — ABNORMAL HIGH (ref 11.5–15.5)
WBC: 5.6 10*3/uL (ref 4.0–10.5)
nRBC: 0 % (ref 0.0–0.2)

## 2019-05-24 LAB — ETHANOL: Alcohol, Ethyl (B): 344 mg/dL (ref <10)

## 2019-05-24 LAB — LIPASE, BLOOD: Lipase: 616 U/L — ABNORMAL HIGH (ref 11–51)

## 2019-05-24 MED ORDER — HYDROMORPHONE HCL 1 MG/ML IJ SOLN: 1.0000 mg | INTRAMUSCULAR | Status: DC

## 2019-05-24 MED ORDER — HYDROMORPHONE HCL 1 MG/ML IJ SOLN
1.0000 mg | INTRAMUSCULAR | Status: AC
  Administered 2019-05-24: 1 mg via INTRAMUSCULAR

## 2019-05-24 MED ORDER — ONDANSETRON HCL 4 MG/2ML IJ SOLN
4.0000 mg | Freq: Once | INTRAMUSCULAR | Status: AC
  Administered 2019-05-24: 21:00:00 4 mg via INTRAVENOUS
  Filled 2019-05-24: qty 2

## 2019-05-24 MED ORDER — HYDROMORPHONE HCL 1 MG/ML IJ SOLN
1.0000 mg | INTRAMUSCULAR | Status: AC
  Administered 2019-05-24: 1 mg via INTRAVENOUS
  Filled 2019-05-24: qty 1

## 2019-05-24 MED ORDER — SODIUM CHLORIDE 0.9 % IV BOLUS
500.0000 mL | Freq: Once | INTRAVENOUS | Status: AC
  Administered 2019-05-24: 500 mL via INTRAVENOUS

## 2019-05-24 MED ORDER — ONDANSETRON HCL 4 MG/2ML IJ SOLN
4.0000 mg | Freq: Once | INTRAMUSCULAR | Status: AC
  Administered 2019-05-24: 4 mg via INTRAVENOUS
  Filled 2019-05-24: qty 2

## 2019-05-24 NOTE — ED Triage Notes (Signed)
Pt arrived via EMS from home ith c/o continuous N/V. Pt to have gallbladder removed but unable due to COVID. Pt retching over side of bed, unwilling to roll over. MD at bedside.

## 2019-05-24 NOTE — ED Provider Notes (Signed)
Patient's lipase returns at 616.  Patient continues to be in bed pain.  Her alcohol level 344.  CT looks okay however.  She still having pain and vomiting.  We will have to get her in the hospital.  As this appears to be uncomplicated alcoholic pancreatitis we should go take care of it here.   Nena Polio, MD 05/24/19 2253

## 2019-05-24 NOTE — ED Notes (Signed)
Patient transported to CT 

## 2019-05-24 NOTE — ED Notes (Signed)
Recollect of red and green tubes drawn and sent to lab. Patient resting in bed. States improvement of pain since second dose of medication

## 2019-05-24 NOTE — ED Provider Notes (Signed)
[facility] Emergency Department Provider Note  ____________________________________________   First MD Initiated Contact with Patient 05/24/19 1938     (approximate)  I have reviewed the triage vital signs and the nursing notes.   HISTORY  Chief Complaint Emesis  EM caveat: Patient severe pain, rolling about, active retching patient reports she can answer questions  HPI Katrina Turner is a 38 y.o. female   who after pain starts to improve with medication reports that she supposed be following up with [facility] but they have keep putting off her procedure went to have her gallbladder and bile ducts evaluated.  She reports that she gets pain and that they delayed her procedure as she had initially tested positive for coronavirus but then found out her test was negative.  She is supposed to have follow-up at the end of this month with [facility], but over the last 2 days that increased severe abdominal pain  Severe upper abdominal pain retching.  Unable to keep food down.  Symptoms been present for about 24 to 48 hours.  Continues to drink alcohol and also reports a history of problems with the pancreas and a previous gastric bypass.  No chest pain or trouble breathing.  No fevers or chills.  She does not have coronavirus that she knows of  Past Medical History:  Diagnosis Date  . Bipolar 1 disorder ([place])   . PTSD (post-traumatic stress disorder)   . Reynolds syndrome [facility])     There are no active problems to display for this patient.   History reviewed. No pertinent surgical history.  Prior to Admission medications   Medication Sig Start Date End Date Taking? Authorizing Provider  amLODipine (NORVASC) 5 MG tablet Take 1 tablet (5 mg total) by mouth daily. 11/29/18   Caryn Section, Linden Dolin, PA-C  benzonatate (TESSALON PERLES) 100 MG capsule Take 1-2 tabs TID prn cough 11/22/18   Menshew, Dannielle Karvonen, PA-C  fluticasone (FLONASE) 50 MCG/ACT nasal spray Place 2 sprays into  both nostrils daily. 11/22/18   Menshew, Dannielle Karvonen, PA-C  HYDROcodone-acetaminophen (NORCO/VICODIN) 5-325 MG tablet Take 1 tablet by mouth every 4 (four) hours as needed. 05/05/19   Harvest Dark, MD  ondansetron (ZOFRAN ODT) 4 MG disintegrating tablet Take 1 tablet (4 mg total) by mouth every 8 (eight) hours as needed for nausea or vomiting. 05/05/19   Harvest Dark, MD    Allergies Bee venom, Depakote er [place] sodium er], Ivp dye [iodinated diagnostic agents], Nsaids, and Tegretol [carbamazepine]  History reviewed. No pertinent family history.  Social History Social History   Tobacco Use  . Smoking status: Current Every Day Smoker    Types: Cigarettes  . Smokeless tobacco: Never Used  Substance Use Topics  . Alcohol use: Yes    Alcohol/week: 35.0 standard drinks    Types: 35 Cans of beer per week    Comment: 6 pack a day  . Drug use: Never    Review of Systems Constitutional: No fever/chills but did feel like she had a fever a few days ago maybe 7 to 10 days ago Eyes: No visual changes. ENT: No sore throat. Cardiovascular: Denies chest pain. Respiratory: Denies shortness of breath. Gastrointestinal: Severe pain across her mid upper abdomen.  Retching and vomiting cannot keep any food down for over a day now.   Genitourinary: Negative for dysuria.  Ports prior hysterectomy. Musculoskeletal: Negative for back pain. Skin: Negative for rash. Neurological: Negative for headaches, areas of focal weakness or numbness.  ____________________________________________   PHYSICAL EXAM:  VITAL SIGNS: ED Triage Vitals  Enc Vitals Group     BP --      Pulse Rate 05/24/19 1946 97     Resp 05/24/19 1946 (!) 22     Temp --      Temp src --      SpO2 05/24/19 1946 97 %     Weight 05/24/19 2054 158 lb 11.7 oz (72 kg)     Height 05/24/19 2054 5\' 7"  (1.702 m)     Head Circumference --      Peak Flow --      Pain Score --      Pain Loc --      Pain Edu? --       Excl. in GC? --     Constitutional: Alert and oriented.  Severe distress, severe pain, laying over the side of stretcher retching actively holding her abdomen appearing like she is in severe acute pain Eyes: Conjunctivae are normal. Head: Atraumatic. Nose: No congestion/rhinnorhea. Mouth/Throat: Mucous membranes are moist. Neck: No stridor.  Cardiovascular: Normal rate, regular rhythm. Grossly normal heart sounds.  Good peripheral circulation. Respiratory: Normal respiratory effort for tachypnea which appears to be secondary to pain and retching.  No retractions. Lungs CTAB. Gastrointestinal: Soft and severe tenderness throughout but no distention.  Unclear if peritoneal signs but do not clinically demonstrating some element of guarding possibly voluntary hard to tell. No distention. Musculoskeletal: No lower extremity tenderness nor edema. Neurologic:  Normal speech and language. No gross focal neurologic deficits are appreciated.  Skin:  Skin is warm, dry and intact. No rash noted. Psychiatric: Mood and affect are anxious but well-oriented. Speech and behavior are normal.  ____________________________________________   LABS (all labs ordered are listed, but only abnormal results are displayed)  Labs Reviewed  CBC WITH DIFFERENTIAL/PLATELET - Abnormal; Notable for the following components:      Result Value   RDW 15.6 (*)    All other components within normal limits  NOVEL CORONAVIRUS, NAA (HOSPITAL ORDER, SEND-OUT TO REF LAB)  URINALYSIS, COMPLETE (UACMP) WITH MICROSCOPIC  COMPREHENSIVE METABOLIC PANEL  LIPASE, BLOOD  ETHANOL   ____________________________________________  EKG   ____________________________________________  RADIOLOGY  CT abdomen pelvis without contrast due to allergy pending at the time of signout to Dr. Juliette AlcideMelinda ____________________________________________   PROCEDURES  Procedure(s) performed: None  Procedures  Critical Care performed: No   ____________________________________________   INITIAL IMPRESSION / ASSESSMENT AND PLAN / ED COURSE  Pertinent labs & imaging results that were available during my care of the patient were reviewed by me and considered in my medical decision making (see chart for details).   Differential diagnosis includes but is not limited to, abdominal perforation, aortic dissection, cholecystitis, appendicitis, diverticulitis, colitis, esophagitis/gastritis, kidney stone, pyelonephritis, urinary tract infection, aortic aneurysm. All are considered in decision and treatment plan. Based upon the patient's presentation and risk factors, I am concerned about acute intra-abdominal pathology and the severity of pain and also her past medical history where she reports she is [facility].  Is an elevated concern for possible pancreatitis, hepatobiliary disease or other acute intra-abdominal cause.  She denies pregnancy and reports previous hysterectomy.  Denies acute cardiac or pulmonary symptoms.  [facility]  ----------------------------------------- 9:10 PM on 05/24/2019 ----------------------------------------- Patient pain beginning to improve after second dose of hydromorphone.  She does still appear uncomfortable but is improving.  Continues to have  retching.  Ongoing care is assigned to Dr. Juliette AlcideMelinda, follow-up on CT as well as comprehensive metabolic panel.  If concern about an acute hepatobiliary process, patient may require [facility] which I have discussed with her and she is understanding potentially need to go there given her past surgical history, but if no acute findings and continue to have significant pain and retching may require admission here.  Much depending upon pending work-up including LFTs.  Dr. Juliette AlcideMelinda taking over care   Mayra Reelmanda Tomes was evaluated in Emergency Department on 05/24/2019 for the symptoms described in  the history of present illness. She was evaluated in the context of the global COVID-19 pandemic, which necessitated consideration that the patient might be at risk for infection with the SARS-CoV-2 virus that causes COVID-19. Institutional protocols and algorithms that pertain to the evaluation of patients at risk for COVID-19 are in a state of rapid change based on information released by regulatory bodies including the CDC and federal and state organizations. These policies and algorithms were followed during the patient's care in the ED.  The patient has recently [facility].  Additionally, she denies respiratory symptoms or symptoms of upper respiratory infection.  Screening COVID test sent to [facility]      ____________________________________________   FINAL CLINICAL IMPRESSION(S) / ED DIAGNOSES  Final diagnoses:  Intractable vomiting with nausea, unspecified vomiting type  Epigastric abdominal pain        Note:  This document was prepared using Dragon voice recognition software and may include unintentional dictation errors       Sharyn CreamerQuale, , MD 05/24/19 2138

## 2019-05-24 NOTE — H&P (Signed)
[facility] [place] NAME: Katrina Turner    MR#:  [id number]  DATE OF BIRTH:  January 30, 1981  DATE OF ADMISSION:  05/24/2019  PRIMARY CARE PHYSICIAN: System, Pcp Not In   REQUESTING/REFERRING PHYSICIAN: Cinda Quest, MD  CHIEF COMPLAINT:   Chief Complaint  Patient presents with  . Emesis    HISTORY OF PRESENT ILLNESS:  Katrina Turner  is a 38 y.o. female who presents with chief complaint as above.  Patient presents the ED with a complaint of 2 to 3 days of abdominal pain with nausea and vomiting.  She states she has had one episode of pancreatitis in the past.  Work-up here in the ED is consistent with the same today, with a lipase greater than 600.  She states that she was drinking a fair amount of alcohol just prior to the start of her symptoms.  CT scan in the ED shows no complications to her pancreatitis.  Hospitalist were called for admission  PAST MEDICAL HISTORY:   Past Medical History:  Diagnosis Date  . Bipolar 1 disorder ([place])   . PTSD (post-traumatic stress disorder)   . Reynolds syndrome [facility])      PAST SURGICAL HISTORY:   Past Surgical History:  Procedure Laterality Date  . ROUX-EN-Y GASTRIC BYPASS       SOCIAL HISTORY:   Social History   Tobacco Use  . Smoking status: Current Every Day Smoker    Types: Cigarettes  . Smokeless tobacco: Never Used  Substance Use Topics  . Alcohol use: Yes    Alcohol/week: 35.0 standard drinks    Types: 35 Cans of beer per week    Comment: 6 pack a day     FAMILY HISTORY:    Family history reviewed and is non-contributory DRUG ALLERGIES:   Allergies  Allergen Reactions  . Bee Venom   . Depakote Er [Divalproex Sodium Er]   . Ivp Dye [Iodinated Diagnostic Agents]   . Nsaids   . Tegretol [Carbamazepine]     MEDICATIONS AT HOME:   Prior to Admission medications   Medication Sig Start Date End Date Taking? Authorizing Provider  amLODipine (NORVASC) 5 MG tablet Take  1 tablet (5 mg total) by mouth daily. 11/29/18   Caryn Section, Linden Dolin, PA-C  benzonatate (TESSALON PERLES) 100 MG capsule Take 1-2 tabs TID prn cough 11/22/18   Menshew, Dannielle Karvonen, PA-C  fluticasone (FLONASE) 50 MCG/ACT nasal spray Place 2 sprays into both nostrils daily. 11/22/18   Menshew, Dannielle Karvonen, PA-C  HYDROcodone-acetaminophen (NORCO/VICODIN) 5-325 MG tablet Take 1 tablet by mouth every 4 (four) hours as needed. 05/05/19   Harvest Dark, MD  ondansetron (ZOFRAN ODT) 4 MG disintegrating tablet Take 1 tablet (4 mg total) by mouth every 8 (eight) hours as needed for nausea or vomiting. 05/05/19   Harvest Dark, MD    REVIEW OF SYSTEMS:  Review of Systems  Constitutional: Negative for chills, fever, malaise/fatigue and weight loss.  HENT: Negative for ear pain, hearing loss and tinnitus.   Eyes: Negative for blurred vision, double vision, pain and redness.  Respiratory: Negative for cough, hemoptysis and shortness of breath.   Cardiovascular: Negative for chest pain, palpitations, orthopnea and leg swelling.  Gastrointestinal: Positive for abdominal pain, nausea and vomiting. Negative for constipation and diarrhea.  Genitourinary: Negative for dysuria, frequency and hematuria.  Musculoskeletal: Negative for back pain, joint pain and neck pain.  Skin:       No acne, rash,  or lesions  Neurological: Negative for dizziness, tremors, focal weakness and weakness.  Endo/Heme/Allergies: Negative for polydipsia. Does not bruise/bleed easily.  Psychiatric/Behavioral: Negative for depression. The patient is not nervous/anxious and does not have insomnia.      VITAL SIGNS:   Vitals:   05/24/19 1946 05/24/19 2030 05/24/19 2054 05/24/19 2300  BP:  114/90  115/64  Pulse: 97 81  83  Resp: (!) 22     SpO2: 97% 90%  (!) 85%  Weight:   72 kg   Height:   5\' 7"  (1.702 m)    Wt Readings from Last 3 Encounters:  05/24/19 72 kg  05/05/19 72.6 kg  11/29/18 81.6 kg    PHYSICAL  EXAMINATION:  Physical Exam  Vitals reviewed. Constitutional: She is oriented to person, place, and time. She appears well-developed and well-nourished. No distress.  HENT:  Head: Normocephalic and atraumatic.  Mouth/Throat: Oropharynx is clear and moist.  Eyes: Pupils are equal, round, and reactive to light. Conjunctivae and EOM are normal. No scleral icterus.  Neck: Normal range of motion. Neck supple. No JVD present. No thyromegaly present.  Cardiovascular: Normal rate, regular rhythm and intact distal pulses. Exam reveals no gallop and no friction rub.  No murmur heard. Respiratory: Effort normal and breath sounds normal. No respiratory distress. She has no wheezes. She has no rales.  GI: Soft. Bowel sounds are normal. She exhibits no distension. There is abdominal tenderness.  Musculoskeletal: Normal range of motion.        General: No edema.     Comments: No arthritis, no gout  Lymphadenopathy:    She has no cervical adenopathy.  Neurological: She is alert and oriented to person, place, and time. No cranial nerve deficit.  No dysarthria, no aphasia  Skin: Skin is warm and dry. No rash noted. No erythema.  Psychiatric: She has a normal mood and affect. Her behavior is normal. Judgment and thought content normal.    LABORATORY PANEL:   CBC Recent Labs  Lab 05/24/19 2002  WBC 5.6  HGB 14.5  HCT 41.7  PLT 299   ------------------------------------------------------------------------------------------------------------------  Chemistries  Recent Labs  Lab 05/24/19 2154  NA 142  K 4.0  CL 108  CO2 23  GLUCOSE 81  BUN 6  CREATININE 0.52  CALCIUM 8.5*  AST 34  ALT 28  ALKPHOS 87  BILITOT 0.5   ------------------------------------------------------------------------------------------------------------------  Cardiac Enzymes No results for input(s): TROPONINI in the last 168  hours. ------------------------------------------------------------------------------------------------------------------  RADIOLOGY:  Ct Abdomen Pelvis Wo Contrast  Result Date: 05/24/2019 CLINICAL DATA:  Continuous nausea and vomiting. EXAM: CT ABDOMEN AND PELVIS WITHOUT CONTRAST TECHNIQUE: Multidetector CT imaging of the abdomen and pelvis was performed following the standard protocol without IV contrast. COMPARISON:  Right upper quadrant ultrasound dated May 05, 2019. CT pelvis dated November 04, 2018. FINDINGS: Lower chest: No acute abnormality. Small fat containing right Bochdalek hernia. Hepatobiliary: Diffuse hepatic steatosis. No focal liver abnormality. The gallbladder is unremarkable. No biliary dilatation. Pancreas: Unremarkable. No pancreatic ductal dilatation or surrounding inflammatory changes. Spleen: Normal in size without focal abnormality. Adrenals/Urinary Tract: Adrenal glands are unremarkable. Kidneys are normal, without renal calculi, focal lesion, or hydronephrosis. Bladder is unremarkable. 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 There is a lumen apposing metal stent between the gastric pouch and excluded stomach from prior GATE procedure. No bowel wall thickening, distention, or surrounding inflammatory changes. Prior appendectomy. Vascular/Lymphatic: No significant vascular findings are present. No enlarged abdominal or pelvic lymph nodes.  Reproductive: Status post hysterectomy. No adnexal masses. Other: Prior ventral hernia repair. No free fluid or pneumoperitoneum. Musculoskeletal: No acute or significant osseous findings. IMPRESSION: 1.  No acute intra-abdominal process. 2. 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 3. Diffuse hepatic steatosis. Electronically Signed   By: Obie DredgeWilliam T Derry M.D.   On: 05/24/2019 21:15    EKG:   Orders placed or performed during the hospital encounter of 05/05/19  . EKG 12-Lead  . EKG  12-Lead    IMPRESSION AND PLAN:  Principal Problem:   Acute pancreatitis -keep patient n.p.o., aggressive IV fluids, recheck a.m. lipase, PRN analgesia and antiemetics Active Problems:   Bipolar 1 disorder (HCC) -continue home meds   Alcohol use -CIWA protocol  Chart review performed and case discussed with ED provider. Labs, imaging and/or ECG reviewed by provider and discussed with patient/family. Management plans discussed with the patient and/or family.  COVID-19 status: Test pending  DVT PROPHYLAXIS: SubQ lovenox   GI PROPHYLAXIS:  None  ADMISSION STATUS: Inpatient     CODE STATUS: Full  TOTAL TIME TAKING CARE OF THIS PATIENT: 45 minutes.   This patient was evaluated in the context of the global COVID-19 pandemic, which necessitated consideration that the patient might be at risk for infection with the SARS-CoV-2 virus that causes COVID-19. Institutional protocols and algorithms that pertain to the evaluation of patients at risk for COVID-19 are in a state of rapid change based on information released by regulatory bodies including the CDC and federal and state organizations. These policies and algorithms were followed to the best of this provider's knowledge to date during the patient's care at this facility.  Barney DrainDavid F Semaya Vida 05/24/2019, 11:37 PM  [facility]  Office  [phone]  CC: Primary care physician; System, Pcp Not In  Note:  This document was prepared using Dragon voice recognition software and may include unintentional dictation errors.

## 2019-05-24 NOTE — ED Notes (Signed)
Answered pt's light, she was already using the bathroom when this EDT entered the room. Assisted pt back to her bed and put her back on the monitor.

## 2019-05-25 ENCOUNTER — Other Ambulatory Visit: Payer: Self-pay

## 2019-05-25 LAB — BASIC METABOLIC PANEL
Anion gap: 13 (ref 5–15)
BUN: 6 mg/dL (ref 6–20)
CO2: 22 mmol/L (ref 22–32)
Calcium: 8.2 mg/dL — ABNORMAL LOW (ref 8.9–10.3)
Chloride: 108 mmol/L (ref 98–111)
Creatinine, Ser: 0.51 mg/dL (ref 0.44–1.00)
GFR calc Af Amer: >60 mL/min (ref >60)
GFR calc non Af Amer: >60 mL/min (ref >60)
Glucose, Bld: 72 mg/dL (ref 70–99)
Potassium: 3.7 mmol/L (ref 3.5–5.1)
Sodium: 143 mmol/L (ref 135–145)

## 2019-05-25 LAB — CBC
HCT: 40.6 % (ref 36.0–46.0)
Hemoglobin: 14 g/dL (ref 12.0–15.0)
MCH: 33.5 pg (ref 26.0–34.0)
MCHC: 34.5 g/dL (ref 30.0–36.0)
MCV: 97.1 fL (ref 80.0–100.0)
Platelets: 248 10*3/uL (ref 150–400)
RBC: 4.18 MIL/uL (ref 3.87–5.11)
RDW: 15.8 % — ABNORMAL HIGH (ref 11.5–15.5)
WBC: 4.5 10*3/uL (ref 4.0–10.5)
nRBC: 0 % (ref 0.0–0.2)

## 2019-05-25 LAB — LIPASE, BLOOD: Lipase: 185 U/L — ABNORMAL HIGH (ref 11–51)

## 2019-05-25 MED ORDER — ENOXAPARIN SODIUM 40 MG/0.4ML ~~LOC~~ SOLN
40.0000 mg | SUBCUTANEOUS | Status: DC
  Administered 2019-05-25 – 2019-05-26 (×2): 40 mg via SUBCUTANEOUS
  Filled 2019-05-25 (×2): qty 0.4

## 2019-05-25 MED ORDER — FOLIC ACID 1 MG PO TABS
1.0000 mg | ORAL_TABLET | Freq: Every day | ORAL | Status: DC
  Administered 2019-05-25 – 2019-05-26 (×2): 1 mg via ORAL
  Filled 2019-05-25 (×2): qty 1

## 2019-05-25 MED ORDER — HYDROMORPHONE HCL 1 MG/ML IJ SOLN
0.5000 mg | INTRAMUSCULAR | Status: DC | PRN
  Administered 2019-05-25 – 2019-05-26 (×5): 0.5 mg via INTRAVENOUS
  Filled 2019-05-25 (×5): qty 1

## 2019-05-25 MED ORDER — LABETALOL HCL 5 MG/ML IV SOLN: 10.0000 mg | INTRAVENOUS | Status: DC | PRN

## 2019-05-25 MED ORDER — ONDANSETRON HCL 4 MG/2ML IJ SOLN
4.0000 mg | Freq: Four times a day (QID) | INTRAMUSCULAR | Status: DC | PRN
  Administered 2019-05-25: 4 mg via INTRAVENOUS
  Filled 2019-05-25: qty 2

## 2019-05-25 MED ORDER — LORAZEPAM 2 MG/ML IJ SOLN: 0.0000 mg | Freq: Two times a day (BID) | INTRAMUSCULAR | Status: DC

## 2019-05-25 MED ORDER — ACETAMINOPHEN 650 MG RE SUPP: 650.0000 mg | Freq: Four times a day (QID) | RECTAL | Status: DC | PRN

## 2019-05-25 MED ORDER — ADULT MULTIVITAMIN W/MINERALS CH
1.0000 | ORAL_TABLET | Freq: Every day | ORAL | Status: DC
  Administered 2019-05-25 – 2019-05-26 (×2): 1 via ORAL
  Filled 2019-05-25 (×2): qty 1

## 2019-05-25 MED ORDER — VITAMIN B-1 100 MG PO TABS
100.0000 mg | ORAL_TABLET | Freq: Every day | ORAL | Status: DC
  Administered 2019-05-25 – 2019-05-26 (×2): 100 mg via ORAL
  Filled 2019-05-25 (×2): qty 1

## 2019-05-25 MED ORDER — CLONIDINE HCL 0.1 MG PO TABS
0.2000 mg | ORAL_TABLET | Freq: Two times a day (BID) | ORAL | Status: DC
  Administered 2019-05-25 – 2019-05-26 (×3): 0.2 mg via ORAL
  Filled 2019-05-25 (×3): qty 2

## 2019-05-25 MED ORDER — LORAZEPAM 2 MG/ML IJ SOLN
1.0000 mg | Freq: Four times a day (QID) | INTRAMUSCULAR | Status: DC | PRN
  Administered 2019-05-25: 12:00:00 1 mg via INTRAVENOUS
  Filled 2019-05-25: qty 1

## 2019-05-25 MED ORDER — LORAZEPAM 1 MG PO TABS: 1.0000 mg | ORAL_TABLET | Freq: Four times a day (QID) | ORAL | Status: DC | PRN

## 2019-05-25 MED ORDER — ONDANSETRON HCL 4 MG PO TABS
4.0000 mg | ORAL_TABLET | Freq: Four times a day (QID) | ORAL | Status: DC | PRN
  Administered 2019-05-25: 4 mg via ORAL
  Filled 2019-05-25: qty 1

## 2019-05-25 MED ORDER — SODIUM CHLORIDE 0.9 % IV SOLN
INTRAVENOUS | Status: DC
  Administered 2019-05-25 – 2019-05-26 (×6): via INTRAVENOUS

## 2019-05-25 MED ORDER — THIAMINE HCL 100 MG/ML IJ SOLN: 100.0000 mg | Freq: Every day | INTRAMUSCULAR | Status: DC

## 2019-05-25 MED ORDER — LORAZEPAM 2 MG/ML IJ SOLN
0.0000 mg | Freq: Four times a day (QID) | INTRAMUSCULAR | Status: DC
  Administered 2019-05-25: 2 mg via INTRAVENOUS
  Administered 2019-05-25: 3 mg via INTRAVENOUS
  Administered 2019-05-25 – 2019-05-26 (×3): 2 mg via INTRAVENOUS
  Filled 2019-05-25: qty 1
  Filled 2019-05-25: qty 2
  Filled 2019-05-25 (×3): qty 1

## 2019-05-25 MED ORDER — ACETAMINOPHEN 325 MG PO TABS: 650.0000 mg | ORAL_TABLET | Freq: Four times a day (QID) | ORAL | Status: DC | PRN

## 2019-05-25 NOTE — ED Notes (Signed)
ED TO INPATIENT HANDOFF REPORT  ED Nurse Name and Phone #: Erie NoeVanessa [phone]  S Name/Age/Gender Katrina ReelAmanda Turner 38 y.o. female Room/Bed: ED18A/ED18A  Code Status   Code Status: Not on file  Home/SNF/Other Home Patient oriented to: self, place, time and situation Is this baseline? Yes   Triage Complete: Triage complete  Chief Complaint Nausea  Triage Note Pt arrived via EMS from home ith c/o continuous N/V. Pt to have gallbladder removed but unable due to COVID. Pt retching over side of bed, unwilling to roll over. MD at bedside.     Allergies Allergies  Allergen Reactions  . Bee Venom   . Depakote Er [Divalproex Sodium Er]   . Ivp Dye [Iodinated Diagnostic Agents]   . Nsaids   . Tegretol [Carbamazepine]     Level of Care/Admitting Diagnosis ED Disposition    ED Disposition Condition Comment   Admit  Hospital Area: [facility] [[id number]]  Level of Care: Med-Surg [16]  Covid Evaluation: Screening Protocol (No Symptoms)  Diagnosis: Acute pancreatitis [577.0.ICD-9-CM]  Admitting Physician: Oralia ManisWILLIS, DAVID [[id number]]  Attending Physician: Oralia ManisWILLIS, DAVID [phone][[id number]]  Estimated length of stay: past midnight tomorrow  Certification:: I certify this patient will need inpatient services for at least 2 midnights  PT Class (Do Not Modify): Inpatient [101]  PT Acc Code (Do Not Modify): Private [1]       B Medical/Surgery History Past Medical History:  Diagnosis Date  . Bipolar 1 disorder (HCC)   . PTSD (post-traumatic stress disorder)   . 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 ROUX-EN-Y GASTRIC BYPASS       A IV Location/Drains/Wounds Patient Lines/Drains/Airways Status   Active Line/Drains/Airways    Name:   Placement date:   Placement time:   Site:   Days:   Peripheral IV 05/24/19 Left Antecubital   05/24/19    1953    Antecubital   1          Intake/Output Last 24 hours  Intake/Output Summary (Last 24  hours) at 05/25/2019 0041 Last data filed at 05/24/2019 2200 Gross per 24 hour  Intake 500 ml  Output -  Net 500 ml    Labs/Imaging Results for orders placed or performed during the hospital encounter of 05/24/19 (from the past 48 hour(s))  CBC with Differential     Status: Abnormal   Collection Time: 05/24/19  8:02 PM  Result Value Ref Range   WBC 5.6 4.0 - 10.5 K/uL   RBC 4.35 3.87 - 5.11 MIL/uL   Hemoglobin 14.5 12.0 - 15.0 g/dL   HCT 84.641.7 96.236.0 - 95.246.0 %   MCV 95.9 80.0 - 100.0 fL   MCH 33.3 26.0 - 34.0 pg   MCHC 34.8 30.0 - 36.0 g/dL   RDW 84.115.6 (H) 32.411.5 - 40.115.5 %   Platelets 299 150 - 400 K/uL   nRBC 0.0 0.0 - 0.2 %   Neutrophils Relative % 46 %   Neutro Abs 2.6 1.7 - 7.7 K/uL   Lymphocytes Relative 41 %   Lymphs Abs 2.3 0.7 - 4.0 K/uL   Monocytes Relative 9 %   Monocytes Absolute 0.5 0.1 - 1.0 K/uL   Eosinophils Relative 2 %   Eosinophils Absolute 0.1 0.0 - 0.5 K/uL   Basophils Relative 2 %   Basophils Absolute 0.1 0.0 - 0.1 K/uL   Immature Granulocytes 0 %   Abs Immature Granulocytes 0.01 0.00 - 0.07 K/uL    Comment:  Performed at [facility], [address] [place], [place] [address]  Ethanol     Status: Abnormal   Collection Time: 05/24/19  9:54 PM  Result Value Ref Range   Alcohol, Ethyl (B) 344 (HH) <10 mg/dL    Comment: CRITICAL RESULT CALLED TO, READ BACK BY AND VERIFIED WITH LAURA CATES ON 05/24/2019 AT 2224 QSD (NOTE) Lowest detectable limit for serum alcohol is 10 mg/dL. For medical purposes only. Performed at [facility], [address] [place], [place] [address]   Comprehensive metabolic panel     Status: Abnormal   Collection Time: 05/24/19  9:54 PM  Result Value Ref Range   Sodium 142 135 - 145 mmol/L   Potassium 4.0 3.5 - 5.1 mmol/L   Chloride 108 98 - 111 mmol/L   CO2 23 22 - 32 mmol/L   Glucose, Bld 81 70 - 99 mg/dL   BUN 6 6 - 20 mg/dL   Creatinine, Ser 0.52 0.44 - 1.00 mg/dL   Calcium 8.5 (L) 8.9 - 10.3 mg/dL   Total  Protein 7.7 6.5 - 8.1 g/dL   Albumin 4.2 3.5 - 5.0 g/dL   AST 34 15 - 41 U/L   ALT 28 0 - 44 U/L   Alkaline Phosphatase 87 38 - 126 U/L   Total Bilirubin 0.5 0.3 - 1.2 mg/dL   GFR calc non Af Amer >60 >60 mL/min   GFR calc Af Amer >60 >60 mL/min   Anion gap 11 5 - 15    Comment: Performed at [facility], [address] [place], [place] [address]  Lipase, blood     Status: Abnormal   Collection Time: 05/24/19  9:54 PM  Result Value Ref Range   Lipase 616 (H) 11 - 51 U/L    Comment: RESULT CONFIRMED BY MANUAL DILUTION. QSD Performed at [facility], [address] [place], [place] [address]   Urinalysis, Complete w Microscopic     Status: Abnormal   Collection Time: 05/24/19 10:01 PM  Result Value Ref Range   Color, Urine STRAW (A) YELLOW   APPearance CLEAR (A) CLEAR   Specific Gravity, Urine 1.003 (L) 1.005 - 1.030   pH 5.0 5.0 - 8.0   Glucose, UA NEGATIVE NEGATIVE mg/dL   Hgb urine dipstick NEGATIVE NEGATIVE   Bilirubin Urine NEGATIVE NEGATIVE   Ketones, ur NEGATIVE NEGATIVE mg/dL   Protein, ur NEGATIVE NEGATIVE mg/dL   Nitrite POSITIVE (A) NEGATIVE   Leukocytes,Ua NEGATIVE NEGATIVE   RBC / HPF 0-5 0 - 5 RBC/hpf   WBC, UA 0-5 0 - 5 WBC/hpf   Bacteria, UA MANY (A) NONE SEEN   Squamous Epithelial / LPF 0-5 0 - 5   Mucus PRESENT     Comment: Performed at [facility], [address] [place], [place] [address]   Ct Abdomen Pelvis Wo Contrast  Result Date: 05/24/2019 CLINICAL DATA:  Continuous nausea and vomiting. EXAM: CT ABDOMEN AND PELVIS WITHOUT CONTRAST TECHNIQUE: Multidetector CT imaging of the abdomen and pelvis was performed following the standard protocol without IV contrast. COMPARISON:  Right upper quadrant ultrasound dated May 05, 2019. CT pelvis dated November 04, 2018. FINDINGS: Lower chest: No acute abnormality. Small fat containing right Bochdalek hernia. Hepatobiliary: Diffuse hepatic steatosis. No focal liver abnormality. The  gallbladder is unremarkable. No biliary dilatation. Pancreas: Unremarkable. No pancreatic ductal dilatation or surrounding inflammatory changes. Spleen: Normal in size without focal abnormality. Adrenals/Urinary Tract: Adrenal glands are unremarkable. Kidneys are normal, without renal  calculi, focal lesion, or hydronephrosis. Bladder is unremarkable. 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 There is a lumen apposing metal stent between the gastric pouch and excluded stomach from prior GATE procedure. No bowel wall thickening, distention, or surrounding inflammatory changes. Prior appendectomy. Vascular/Lymphatic: No significant vascular findings are present. No enlarged abdominal or pelvic lymph nodes. Reproductive: Status post hysterectomy. No adnexal masses. Other: Prior ventral hernia repair. No free fluid or pneumoperitoneum. Musculoskeletal: No acute or significant osseous findings. IMPRESSION: 1.  No acute intra-abdominal process. 2. 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 3. Diffuse hepatic steatosis. Electronically Signed   By: Obie DredgeWilliam T Derry M.D.   On: 05/24/2019 21:15    Pending Labs Unresulted Labs (From admission, onward)    Start     Ordered   05/24/19 2105  Novel Coronavirus,NAA,(SEND-OUT TO REF LAB - TAT 24-48 hrs); Hosp Order  (Asymptomatic Patients Labs)  ONCE - STAT,   STAT    Question:  Rule Out  Answer:  Yes   05/24/19 2104   Signed and Held  HIV antibody (Routine Testing)  Once,   R     Signed and Held   Signed and Held  CBC  (enoxaparin (LOVENOX)    CrCl >/= 30 ml/min)  Once,   R    Comments: Baseline for enoxaparin therapy IF NOT ALREADY DRAWN.  Notify MD if PLT < 100 K.    Signed and Held   Signed and Held  Creatinine, serum  (enoxaparin (LOVENOX)    CrCl >/= 30 ml/min)  Once,   R    Comments: Baseline for enoxaparin therapy IF NOT ALREADY DRAWN.    Signed and Held   Signed and Held  Creatinine, serum   (enoxaparin (LOVENOX)    CrCl >/= 30 ml/min)  Weekly,   R    Comments: while on enoxaparin therapy    Signed and Held   Signed and Held  Basic metabolic panel  Tomorrow morning,   R     Signed and Held   Signed and Held  CBC  Tomorrow morning,   R     Signed and Held   Signed and Held  Lipase, blood  Tomorrow morning,   R     Signed and Held          Vitals/Pain Today's Vitals   05/24/19 2054 05/24/19 2200 05/24/19 2300 05/24/19 2330  BP:   115/64 103/67  Pulse:   83 81  Resp:      SpO2:   97% 96%  Weight: 72 kg     Height: 5\' 7"  (1.702 m)     PainSc:  5       Isolation Precautions No active isolations  Medications Medications  ondansetron (ZOFRAN) injection 4 mg (4 mg Intravenous Given 05/24/19 1953)  sodium chloride 0.9 % bolus 500 mL (0 mLs Intravenous Stopped 05/24/19 2200)  HYDROmorphone (DILAUDID) injection 1 mg (1 mg Intramuscular Given 05/24/19 1953)  HYDROmorphone (DILAUDID) injection 1 mg (1 mg Intravenous Given 05/24/19 2114)  ondansetron (ZOFRAN) injection 4 mg (4 mg Intravenous Given 05/24/19 2114)    Mobility walks Low fall risk   Focused Assessments other   R Recommendations: See Admitting Provider Note  Report given to:   Additional Notes:

## 2019-05-25 NOTE — Progress Notes (Signed)
Advanced care plan. Purpose of the Encounter: CODE STATUS Parties in Attendance: Patient Patient's Decision Capacity: Good Subjective/Patient's story: Shenica Holzheimer  is a 38 y.o. female who presents with chief complaint as above.  Patient presents the ED with a complaint of 2 to 3 days of abdominal pain with nausea and vomiting.  She states she has had one episode of pancreatitis in the past.  Work-up here in the ED is consistent with the same today, with a lipase greater than 600.  She states that she was drinking a fair amount of alcohol just prior to the start of her symptoms.  CT scan in the ED shows no complications to her pancreatitis.  Hospitalist were called for admission Objective/Medical story Patient needs admission for pancreatitis.  Needs IV fluids pain management and antiemetics intravenously.  Lipase levels to be followed. Goals of care determination:  Advance care directives goals of care and treatment plan discussed. Patient wants everything done which includes CPR, intubation and ventilator if the need arises. CODE STATUS: Full code Time spent discussing advanced care planning: 16 minutes

## 2019-05-25 NOTE — Progress Notes (Signed)
Katrina Turner    MR#:  [id number]  DATE OF BIRTH:  09-Jan-1981  SUBJECTIVE:  CHIEF COMPLAINT:   Chief Complaint  Patient presents with  . Emesis  Patient seen and evaluated today Has decreased abdominal pain No nausea No cough No fever  REVIEW OF SYSTEMS:    ROS  CONSTITUTIONAL: No documented fever. No fatigue, weakness. No weight gain, no weight loss.  EYES: No blurry or double vision.  ENT: No tinnitus. No postnasal drip. No redness of the oropharynx.  RESPIRATORY: No cough, no wheeze, no hemoptysis. No dyspnea.  CARDIOVASCULAR: No chest pain. No orthopnea. No palpitations. No syncope.  GASTROINTESTINAL: No nausea, no vomiting or diarrhea. Has decreased abdominal pain. No melena or hematochezia.  GENITOURINARY: No dysuria or hematuria.  ENDOCRINE: No polyuria or nocturia. No heat or cold intolerance.  HEMATOLOGY: No anemia. No bruising. No bleeding.  INTEGUMENTARY: No rashes. No lesions.  MUSCULOSKELETAL: No arthritis. No swelling. No gout.  NEUROLOGIC: No numbness, tingling, or ataxia. No seizure-type activity.  PSYCHIATRIC: No anxiety. No insomnia. No ADD.   DRUG ALLERGIES:   Allergies  Allergen Reactions  . Bee Venom   . Depakote Er [Divalproex Sodium Er]   . Ivp Dye [Iodinated Diagnostic Agents]   . Nsaids   . Tegretol [Carbamazepine]     VITALS:  Blood pressure (!) 146/114, pulse 85, temperature 98.2 F (36.8 C), temperature source Oral, resp. rate 16, height 5\' 7"  (1.702 m), weight 72 kg, SpO2 92 %.  PHYSICAL EXAMINATION:   Physical Exam  GENERAL:  38 y.o.-year-old patient lying in the bed with no acute distress.  EYES: Pupils equal, round, reactive to light and accommodation. No scleral icterus. Extraocular muscles intact.  HEENT: Head atraumatic, normocephalic. Oropharynx and nasopharynx clear.  NECK:  Supple, no jugular venous distention. No thyroid enlargement, no tenderness.   LUNGS: Normal breath sounds bilaterally, no wheezing, rales, rhonchi. No use of accessory muscles of respiration.  CARDIOVASCULAR: S1, S2 normal. No murmurs, rubs, or gallops.  ABDOMEN: Soft, decreased tenderness in the epigastrium, nondistended. Bowel sounds present. No organomegaly or mass.  EXTREMITIES: No cyanosis, clubbing or edema b/l.    NEUROLOGIC: Cranial nerves II through XII are intact. No focal Motor or sensory deficits b/l.   PSYCHIATRIC: The patient is alert and oriented x 3.  SKIN: No obvious rash, lesion, or ulcer.   LABORATORY PANEL:   CBC Recent Labs  Lab 05/25/19 0417  WBC 4.5  HGB 14.0  HCT 40.6  PLT 248   ------------------------------------------------------------------------------------------------------------------ Chemistries  Recent Labs  Lab 05/24/19 2154 05/25/19 0417  NA 142 143  K 4.0 3.7  CL 108 108  CO2 23 22  GLUCOSE 81 72  BUN 6 6  CREATININE 0.52 0.51  CALCIUM 8.5* 8.2*  AST 34  --   ALT 28  --   ALKPHOS 87  --   BILITOT 0.5  --    ------------------------------------------------------------------------------------------------------------------  Cardiac Enzymes No results for input(s): TROPONINI in the last 168 hours. ------------------------------------------------------------------------------------------------------------------  RADIOLOGY:  Ct Abdomen Pelvis Wo Contrast  Result Date: 05/24/2019 CLINICAL DATA:  Continuous nausea and vomiting. EXAM: CT ABDOMEN AND PELVIS WITHOUT CONTRAST TECHNIQUE: Multidetector CT imaging of the abdomen and pelvis was performed following the standard protocol without IV contrast. COMPARISON:  Right upper quadrant ultrasound dated May 05, 2019. CT pelvis dated November 04, 2018. FINDINGS: Lower chest: No acute abnormality. Small fat containing right Bochdalek hernia. Hepatobiliary: Diffuse hepatic  steatosis. No focal liver abnormality. The gallbladder is unremarkable. No biliary dilatation.  Pancreas: Unremarkable. No pancreatic ductal dilatation or surrounding inflammatory changes. Spleen: Normal in size without focal abnormality. Adrenals/Urinary Tract: Adrenal glands are unremarkable. Kidneys are normal, without renal calculi, focal lesion, or hydronephrosis. Bladder is unremarkable. 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 There is a lumen apposing metal stent between the gastric pouch and excluded stomach from prior GATE procedure. No bowel wall thickening, distention, or surrounding inflammatory changes. Prior appendectomy. Vascular/Lymphatic: No significant vascular findings are present. No enlarged abdominal or pelvic lymph nodes. Reproductive: Status post hysterectomy. No adnexal masses. Other: Prior ventral hernia repair. No free fluid or pneumoperitoneum. Musculoskeletal: No acute or significant osseous findings. IMPRESSION: 1.  No acute intra-abdominal process. 2. 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 3. Diffuse hepatic steatosis. Electronically Signed   By: Obie DredgeWilliam T Derry M.D.   On: 05/24/2019 21:15     ASSESSMENT AND PLAN:  38 year old female patient with history of tobacco abuse, bipolar disorder, posttraumatic stress disorder, reynolds syndrome currently under hospitalist service  -Acute pancreatitis Improving IV fluids Start clear liquid diet Lipase coming down Pain management  -Tobacco abuse Tobacco cessation counseled to the patient for 6 minutes Nicotine patch offered  -COVID-19 test pending results Droplet and contact precautions  -DVT prophylaxis subcu Lovenox daily  -Alcohol abuse Watch for withdrawal CIWA protocol  All the records are reviewed and case discussed with Care Management/Social Worker. Management plans discussed with the patient, family and they are in agreement.  CODE STATUS: Full code  DVT Prophylaxis: SCDs  TOTAL TIME TAKING CARE OF THIS PATIENT: 38 minutes.    POSSIBLE D/C IN 2 to 3 DAYS, DEPENDING ON CLINICAL CONDITION.  Ihor AustinPavan Pyreddy M.D on 05/25/2019 at 9:56 AM  Between 7am to 6pm - Pager - [phone]  After 6pm go to [email or web address] - password [email or web address]  SOUND  Hospitalists  Office  [phone]  CC: Primary care physician; System, Pcp Not In  Note: This dictation was prepared with Dragon dictation along with smaller phrase technology. Any transcriptional errors that result from this process are unintentional.

## 2019-05-26 LAB — BASIC METABOLIC PANEL
Anion gap: 11 (ref 5–15)
BUN: <5 mg/dL — ABNORMAL LOW (ref 6–20)
CO2: 25 mmol/L (ref 22–32)
Calcium: 8.3 mg/dL — ABNORMAL LOW (ref 8.9–10.3)
Chloride: 101 mmol/L (ref 98–111)
Creatinine, Ser: 0.53 mg/dL (ref 0.44–1.00)
GFR calc Af Amer: >60 mL/min (ref >60)
GFR calc non Af Amer: >60 mL/min (ref >60)
Glucose, Bld: 90 mg/dL (ref 70–99)
Potassium: 3.8 mmol/L (ref 3.5–5.1)
Sodium: 137 mmol/L (ref 135–145)

## 2019-05-26 LAB — NOVEL CORONAVIRUS, NAA (HOSP ORDER, SEND-OUT TO REF LAB; TAT 18-24 HRS): SARS-CoV-2, NAA: NOT DETECTED

## 2019-05-26 LAB — LIPASE, BLOOD: Lipase: 44 U/L (ref 11–51)

## 2019-05-26 LAB — HIV ANTIBODY (ROUTINE TESTING W REFLEX): HIV Screen 4th Generation wRfx: NONREACTIVE

## 2019-05-26 MED ORDER — PHENOL 1.4 % MT LIQD
1.0000 | OROMUCOSAL | Status: DC | PRN
  Filled 2019-05-26: qty 177

## 2019-05-26 MED ORDER — AMLODIPINE BESYLATE 5 MG PO TABS
5.0000 mg | ORAL_TABLET | Freq: Every day | ORAL | Status: DC
  Administered 2019-05-26: 14:00:00 5 mg via ORAL
  Filled 2019-05-26: qty 1

## 2019-05-26 NOTE — Progress Notes (Signed)
Patient left AMA. Dr Estanislado Pandy notified. Patient educated on staying isolated until covid test results are given.

## 2019-05-26 NOTE — Progress Notes (Signed)
[place] [facility] [place] NAME: Katrina Turner    MR#:  [id number]  DATE OF BIRTH:  Sep 13, 1981  SUBJECTIVE:  CHIEF COMPLAINT:   Chief Complaint  Patient presents with  . Emesis  Patient seen and evaluated today Has decreased abdominal pain No nausea No cough No fever Tolerated liquid diet well  REVIEW OF SYSTEMS:    ROS  CONSTITUTIONAL: No documented fever. No fatigue, weakness. No weight gain, no weight loss.  EYES: No blurry or double vision.  ENT: No tinnitus. No postnasal drip. No redness of the oropharynx.  RESPIRATORY: No cough, no wheeze, no hemoptysis. No dyspnea.  CARDIOVASCULAR: No chest pain. No orthopnea. No palpitations. No syncope.  GASTROINTESTINAL: No nausea, no vomiting or diarrhea. Has decreased abdominal pain. No melena or hematochezia.  GENITOURINARY: No dysuria or hematuria.  ENDOCRINE: No polyuria or nocturia. No heat or cold intolerance.  HEMATOLOGY: No anemia. No bruising. No bleeding.  INTEGUMENTARY: No rashes. No lesions.  MUSCULOSKELETAL: No arthritis. No swelling. No gout.  NEUROLOGIC: No numbness, tingling, or ataxia. No seizure-type activity.  PSYCHIATRIC: No anxiety. No insomnia. No ADD.   DRUG ALLERGIES:   Allergies  Allergen Reactions  . Bee Venom   . Depakote Er [Divalproex Sodium Er]   . Ivp Dye [Iodinated Diagnostic Agents]   . Nsaids   . Tegretol [Carbamazepine]     VITALS:  Blood pressure (!) 153/98, pulse (!) 56, temperature 97.7 F (36.5 C), temperature source Oral, resp. rate 20, height 5\' 7"  (1.702 m), weight 72 kg, SpO2 94 %.  PHYSICAL EXAMINATION:   Physical Exam  GENERAL:  38 y.o.-year-old patient lying in the bed with no acute distress.  EYES: Pupils equal, round, reactive to light and accommodation. No scleral icterus. Extraocular muscles intact.  HEENT: Head atraumatic, normocephalic. Oropharynx and nasopharynx clear.  NECK:  Supple, no jugular venous distention. No thyroid  enlargement, no tenderness.  LUNGS: Normal breath sounds bilaterally, no wheezing, rales, rhonchi. No use of accessory muscles of respiration.  CARDIOVASCULAR: S1, S2 normal. No murmurs, rubs, or gallops.  ABDOMEN: Soft, decreased tenderness in the epigastrium, nondistended. Bowel sounds present. No organomegaly or mass.  EXTREMITIES: No cyanosis, clubbing or edema b/l.    NEUROLOGIC: Cranial nerves II through XII are intact. No focal Motor or sensory deficits b/l.   PSYCHIATRIC: The patient is alert and oriented x 3.  SKIN: No obvious rash, lesion, or ulcer.   LABORATORY PANEL:   CBC Recent Labs  Lab 05/25/19 0417  WBC 4.5  HGB 14.0  HCT 40.6  PLT 248   ------------------------------------------------------------------------------------------------------------------ Chemistries  Recent Labs  Lab 05/24/19 2154  05/26/19 0646  NA 142   < > 137  K 4.0   < > 3.8  CL 108   < > 101  CO2 23   < > 25  GLUCOSE 81   < > 90  BUN 6   < > <5*  CREATININE 0.52   < > 0.53  CALCIUM 8.5*   < > 8.3*  AST 34  --   --   ALT 28  --   --   ALKPHOS 87  --   --   BILITOT 0.5  --   --    < > = values in this interval not displayed.   ------------------------------------------------------------------------------------------------------------------  Cardiac Enzymes No results for input(s): TROPONINI in the last 168 hours. ------------------------------------------------------------------------------------------------------------------  RADIOLOGY:  Ct Abdomen Pelvis Wo Contrast  Result Date: 05/24/2019 CLINICAL DATA:  Continuous nausea and vomiting. EXAM: CT ABDOMEN AND PELVIS WITHOUT CONTRAST TECHNIQUE: Multidetector CT imaging of the abdomen and pelvis was performed following the standard protocol without IV contrast. COMPARISON:  Right upper quadrant ultrasound dated May 05, 2019. CT pelvis dated November 04, 2018. FINDINGS: Lower chest: No acute abnormality. Small fat containing right  Bochdalek hernia. Hepatobiliary: Diffuse hepatic steatosis. No focal liver abnormality. The gallbladder is unremarkable. No biliary dilatation. Pancreas: Unremarkable. No pancreatic ductal dilatation or surrounding inflammatory changes. Spleen: Normal in size without focal abnormality. Adrenals/Urinary Tract: Adrenal glands are unremarkable. Kidneys are normal, without renal calculi, focal lesion, or hydronephrosis. Bladder is unremarkable. 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 There is a lumen apposing metal stent between the gastric pouch and excluded stomach from prior GATE procedure. No bowel wall thickening, distention, or surrounding inflammatory changes. Prior appendectomy. Vascular/Lymphatic: No significant vascular findings are present. No enlarged abdominal or pelvic lymph nodes. Reproductive: Status post hysterectomy. No adnexal masses. Other: Prior ventral hernia repair. No free fluid or pneumoperitoneum. Musculoskeletal: No acute or significant osseous findings. IMPRESSION: 1.  No acute intra-abdominal process. 2. 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 3. Diffuse hepatic steatosis. Electronically Signed   By: Obie DredgeWilliam T Derry M.D.   On: 05/24/2019 21:15     ASSESSMENT AND PLAN:  38 year old female patient with history of tobacco abuse, bipolar disorder, posttraumatic stress disorder, reynolds syndrome currently under hospitalist service  -Acute pancreatitis Improving IV fluids Advance to oral soft diet Lipase decreased Pain management  -Tobacco abuse Tobacco cessation counseled to the patient for 6 minutes Nicotine patch offered  -COVID-19 test pending results  -DVT prophylaxis subcu Lovenox daily  -Alcohol abuse and withdrawal CIWA protocol  -Gait unsteadiness Secondary to alcohol withdrawal  All the records are reviewed and case discussed with Care Management/Social Worker. Management plans discussed with the  patient, family and they are in agreement.  CODE STATUS: Full code  DVT Prophylaxis: SCDs  TOTAL TIME TAKING CARE OF THIS PATIENT: 35 minutes.   POSSIBLE D/C IN 2 to 3 DAYS, DEPENDING ON CLINICAL CONDITION.  Ihor AustinPavan Summar Mcglothlin M.D on 05/26/2019 at 11:44 AM  Between 7am to 6pm - Pager - [phone]  After 6pm go to [email or web address] - password [email or web address]  [facility] [place] [facility]  Office  [phone]  CC: Primary care physician; System, Pcp Not In  Note: This dictation was prepared with Dragon dictation along with smaller phrase technology. Any transcriptional errors that result from this process are unintentional.

## 2019-05-26 NOTE — Progress Notes (Signed)
Spoke to Dr Estanislado Pandy concerning patient's sore throat and Covid test.  Due to her stating she thinks it is allergy related, and throwing up Dr Pryreddy feels it is not Covid.  No rapid test at this time

## 2019-05-29 NOTE — Discharge Summary (Signed)
Discharge Diagnosis 1.Alcohol withdrawal 2.Acute Pancreatitis 3.Tobacco abuse 4.Gait instability History of Present Ilness and course of Hospital Stay Katrina Turner  is a 38 y.o. female who presents with chief complaint as above.  Patient presents the ED with a complaint of 2 to 3 days of abdominal pain with nausea and vomiting.  She states she has had one episode of pancreatitis in the past.  Work-up here in the ED is consistent with the same today, with a lipase greater than 600.  She states that she was drinking a fair amount of alcohol just prior to the start of her symptoms.  CT scan in the ED shows no complications to her pancreatitis.  Hospitalist were called for admission Patient received IV fluids Tobacco cessation was counseled to patient.Covid 19 test negative. Lipase levels came down. Patient signed AMA and left hospital.

## 2019-06-27 DIAGNOSIS — F1010 Alcohol abuse, uncomplicated: Secondary | ICD-10-CM | POA: Insufficient documentation

## 2019-11-28 DIAGNOSIS — K859 Acute pancreatitis without necrosis or infection, unspecified: Secondary | ICD-10-CM

## 2019-11-28 HISTORY — DX: Acute pancreatitis without necrosis or infection, unspecified: K85.90

## 2020-04-25 ENCOUNTER — Ambulatory Visit: Payer: Medicaid - Out of State | Admitting: Adult Health

## 2020-06-05 ENCOUNTER — Encounter: Payer: Self-pay | Admitting: Adult Health

## 2020-06-05 ENCOUNTER — Other Ambulatory Visit: Payer: Self-pay

## 2020-06-05 ENCOUNTER — Ambulatory Visit (INDEPENDENT_AMBULATORY_CARE_PROVIDER_SITE_OTHER): Payer: 59 | Admitting: Adult Health

## 2020-06-05 VITALS — BP 122/70 | HR 61 | Temp 97.3°F | Resp 16 | Ht 67.0 in | Wt 148.4 lb

## 2020-06-05 DIAGNOSIS — Z972 Presence of dental prosthetic device (complete) (partial): Secondary | ICD-10-CM

## 2020-06-05 DIAGNOSIS — L989 Disorder of the skin and subcutaneous tissue, unspecified: Secondary | ICD-10-CM | POA: Insufficient documentation

## 2020-06-05 DIAGNOSIS — R232 Flushing: Secondary | ICD-10-CM

## 2020-06-05 DIAGNOSIS — Z1231 Encounter for screening mammogram for malignant neoplasm of breast: Secondary | ICD-10-CM

## 2020-06-05 DIAGNOSIS — Z13220 Encounter for screening for lipoid disorders: Secondary | ICD-10-CM

## 2020-06-05 DIAGNOSIS — Z7289 Other problems related to lifestyle: Secondary | ICD-10-CM

## 2020-06-05 DIAGNOSIS — Z0000 Encounter for general adult medical examination without abnormal findings: Secondary | ICD-10-CM

## 2020-06-05 DIAGNOSIS — G43709 Chronic migraine without aura, not intractable, without status migrainosus: Secondary | ICD-10-CM | POA: Insufficient documentation

## 2020-06-05 DIAGNOSIS — Z789 Other specified health status: Secondary | ICD-10-CM

## 2020-06-05 DIAGNOSIS — Z9889 Other specified postprocedural states: Secondary | ICD-10-CM

## 2020-06-05 DIAGNOSIS — M329 Systemic lupus erythematosus, unspecified: Secondary | ICD-10-CM

## 2020-06-05 DIAGNOSIS — K861 Other chronic pancreatitis: Secondary | ICD-10-CM | POA: Diagnosis not present

## 2020-06-05 DIAGNOSIS — J302 Other seasonal allergic rhinitis: Secondary | ICD-10-CM | POA: Insufficient documentation

## 2020-06-05 DIAGNOSIS — F609 Personality disorder, unspecified: Secondary | ICD-10-CM | POA: Insufficient documentation

## 2020-06-05 DIAGNOSIS — Z9884 Bariatric surgery status: Secondary | ICD-10-CM | POA: Insufficient documentation

## 2020-06-05 DIAGNOSIS — K08109 Complete loss of teeth, unspecified cause, unspecified class: Secondary | ICD-10-CM

## 2020-06-05 DIAGNOSIS — Z79899 Other long term (current) drug therapy: Secondary | ICD-10-CM | POA: Insufficient documentation

## 2020-06-05 DIAGNOSIS — Z90711 Acquired absence of uterus with remaining cervical stump: Secondary | ICD-10-CM

## 2020-06-05 DIAGNOSIS — Z1389 Encounter for screening for other disorder: Secondary | ICD-10-CM

## 2020-06-05 DIAGNOSIS — R399 Unspecified symptoms and signs involving the genitourinary system: Secondary | ICD-10-CM

## 2020-06-05 DIAGNOSIS — F319 Bipolar disorder, unspecified: Secondary | ICD-10-CM

## 2020-06-05 DIAGNOSIS — T148XXA Other injury of unspecified body region, initial encounter: Secondary | ICD-10-CM

## 2020-06-05 MED ORDER — ALBUTEROL SULFATE HFA 108 (90 BASE) MCG/ACT IN AERS: 1.0000 | INHALATION_SPRAY | Freq: Four times a day (QID) | RESPIRATORY_TRACT | 0 refills | Status: DC | PRN

## 2020-06-05 MED ORDER — FLUTICASONE PROPIONATE 50 MCG/ACT NA SUSP: 1.0000 | Freq: Every day | NASAL | 0 refills | Status: DC

## 2020-06-05 MED ORDER — CETIRIZINE HCL 10 MG PO TABS: 10.0000 mg | ORAL_TABLET | Freq: Every day | ORAL | 0 refills | Status: DC

## 2020-06-05 NOTE — Patient Instructions (Signed)
 Health Maintenance, Female Adopting a healthy lifestyle and getting preventive care are important in promoting health and wellness. Ask your health care provider about:  The right schedule for you to have regular tests and exams.  Things you can do on your own to prevent diseases and keep yourself healthy. What should I know about diet, weight, and exercise? Eat a healthy diet   Eat a diet that includes plenty of vegetables, fruits, low-fat dairy products, and lean protein.  Do not eat a lot of foods that are high in solid fats, added sugars, or sodium. Maintain a healthy weight Body mass index (BMI) is used to identify weight problems. It estimates body fat based on height and weight. Your health care provider can help determine your BMI and help you achieve or maintain a healthy weight. Get regular exercise Get regular exercise. This is one of the most important things you can do for your health. Most adults should:  Exercise for at least 150 minutes each week. The exercise should increase your heart rate and make you sweat (moderate-intensity exercise).  Do strengthening exercises at least twice a week. This is in addition to the moderate-intensity exercise.  Spend less time sitting. Even light physical activity can be beneficial. Watch cholesterol and blood lipids Have your blood tested for lipids and cholesterol at 40 years of age, then have this test every 5 years. Have your cholesterol levels checked more often if:  Your lipid or cholesterol levels are high.  You are older than 40 years of age.  You are at high risk for heart disease. What should I know about cancer screening? Depending on your health history and family history, you may need to have cancer screening at various ages. This may include screening for:  Breast cancer.  Cervical cancer.  Colorectal cancer.  Skin cancer.  Lung cancer. What should I know about heart disease, diabetes, and high blood  pressure? Blood pressure and heart disease  High blood pressure causes heart disease and increases the risk of stroke. This is more likely to develop in people who have high blood pressure readings, are of African descent, or are overweight.  Have your blood pressure checked: ? Every 3-5 years if you are 18-39 years of age. ? Every year if you are 40 years old or older. Diabetes Have regular diabetes screenings. This checks your fasting blood sugar level. Have the screening done:  Once every three years after age 40 if you are at a normal weight and have a low risk for diabetes.  More often and at a younger age if you are overweight or have a high risk for diabetes. What should I know about preventing infection? Hepatitis B If you have a higher risk for hepatitis B, you should be screened for this virus. Talk with your health care provider to find out if you are at risk for hepatitis B infection. Hepatitis C Testing is recommended for:  Everyone born from 1945 through 1965.  Anyone with known risk factors for hepatitis C. Sexually transmitted infections (STIs)  Get screened for STIs, including gonorrhea and chlamydia, if: ? You are sexually active and are younger than 40 years of age. ? You are older than 40 years of age and your health care provider tells you that you are at risk for this type of infection. ? Your sexual activity has changed since you were last screened, and you are at increased risk for chlamydia or gonorrhea. Ask your health care provider   if you are at risk.  Ask your health care provider about whether you are at high risk for HIV. Your health care provider may recommend a prescription medicine to help prevent HIV infection. If you choose to take medicine to prevent HIV, you should first get tested for HIV. You should then be tested every 3 months for as long as you are taking the medicine. Pregnancy  If you are about to stop having your period (premenopausal) and  you may become pregnant, seek counseling before you get pregnant.  Take 400 to 800 micrograms (mcg) of folic acid every day if you become pregnant.  Ask for birth control (contraception) if you want to prevent pregnancy. Osteoporosis and menopause Osteoporosis is a disease in which the bones lose minerals and strength with aging. This can result in bone fractures. If you are 65 years old or older, or if you are at risk for osteoporosis and fractures, ask your health care provider if you should:  Be screened for bone loss.  Take a calcium or vitamin D supplement to lower your risk of fractures.  Be given hormone replacement therapy (HRT) to treat symptoms of menopause. Follow these instructions at home: Lifestyle  Do not use any products that contain nicotine or tobacco, such as cigarettes, e-cigarettes, and chewing tobacco. If you need help quitting, ask your health care provider.  Do not use street drugs.  Do not share needles.  Ask your health care provider for help if you need support or information about quitting drugs. Alcohol use  Do not drink alcohol if: ? Your health care provider tells you not to drink. ? You are pregnant, may be pregnant, or are planning to become pregnant.  If you drink alcohol: ? Limit how much you use to 0-1 drink a day. ? Limit intake if you are breastfeeding.  Be aware of how much alcohol is in your drink. In the [place] one drink equals one 12 oz bottle of beer (355 mL), one 5 oz glass of wine (148 mL), or one 1 oz glass of hard liquor (44 mL). General instructions  Schedule regular health, dental, and eye exams.  Stay current with your vaccines.  Tell your health care provider if: ? You often feel depressed. ? You have ever been abused or do not feel safe at home. Summary  Adopting a healthy lifestyle and getting preventive care are important in promoting health and wellness.  Follow your health care provider's instructions about healthy  diet, exercising, and getting tested or screened for diseases.  Follow your health care provider's instructions on monitoring your cholesterol and blood pressure. This information is not intended to replace advice given to you by your health care provider. Make sure you discuss any questions you have with your health care provider. Document Revised: 11/17/2018 Document Reviewed: 11/17/2018 [employer] Patient Education  2020 [employer]  Breast Self-Awareness Breast self-awareness is knowing how your breasts look and feel. Doing breast self-awareness is important. It allows you to catch a breast problem early while it is still small and can be treated. All women should do breast self-awareness, including women who have had breast implants. Tell your doctor if you notice a change in your breasts. What you need:  A mirror.  A well-lit room. How to do a breast self-exam A breast self-exam is one way to learn what is normal for your breasts and to check for changes. To do a breast self-exam: Look for changes  1. Take off all   the clothes above your waist. 2. Stand in front of a mirror in a room with good lighting. 3. Put your hands on your hips. 4. Push your hands down. 5. Look at your breasts and nipples in the mirror to see if one breast or nipple looks different from the other. Check to see if: ? The shape of one breast is different. ? The size of one breast is different. ? There are wrinkles, dips, and bumps in one breast and not the other. 6. Look at each breast for changes in the skin, such as: ? Redness. ? Scaly areas. 7. Look for changes in your nipples, such as: ? Liquid around the nipples. ? Bleeding. ? Dimpling. ? Redness. ? A change in where the nipples are. Feel for changes  1. Lie on your back on the floor. 2. Feel each breast. To do this, follow these steps: ? Pick a breast to feel. ? Put the arm closest to that breast above your head. ? Use your other arm to feel  the nipple area of your breast. Feel the area with the pads of your three middle fingers by making small circles with your fingers. For the first circle, press lightly. For the second circle, press harder. For the third circle, press even harder. ? Keep making circles with your fingers at the different pressures as you move down your breast. Stop when you feel your ribs. ? Move your fingers a little toward the center of your body. ? Start making circles with your fingers again, this time going up until you reach your collarbone. ? Keep making up-and-down circles until you reach your armpit. Remember to keep using the three pressures. ? Feel the other breast in the same way. 3. Sit or stand in the tub or shower. 4. With soapy water on your skin, feel each breast the same way you did in step 2 when you were lying on the floor. Write down what you find Writing down what you find can help you remember what to tell your doctor. Write down:  What is normal for each breast.  Any changes you find in each breast, including: ? The kind of changes you find. ? Whether you have pain. ? Size and location of any lumps.  When you last had your menstrual period. General tips  Check your breasts every month.  If you are breastfeeding, the best time to check your breasts is after you feed your baby or after you use a breast pump.  If you get menstrual periods, the best time to check your breasts is 5-7 days after your menstrual period is over.  With time, you will become comfortable with the self-exam, and you will begin to know if there are changes in your breasts. Contact a doctor if you:  See a change in the shape or size of your breasts or nipples.  See a change in the skin of your breast or nipples, such as red or scaly skin.  Have fluid coming from your nipples that is not normal.  Find a lump or thick area that was not there before.  Have pain in your breasts.  Have any concerns about  your breast health. Summary  Breast self-awareness includes looking for changes in your breasts, as well as feeling for changes within your breasts.  Breast self-awareness should be done in front of a mirror in a well-lit room.  You should check your breasts every month. If you get menstrual periods, the best   time to check your breasts is 5-7 days after your menstrual period is over.  Let your doctor know of any changes you see in your breasts, including changes in size, changes on the skin, pain or tenderness, or fluid from your nipples that is not normal. This information is not intended to replace advice given to you by your health care provider. Make sure you discuss any questions you have with your health care provider. Document Revised: 07/13/2018 Document Reviewed: 07/13/2018 [employer] Patient Education  2020 [employer]  
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
## 2020-06-06 ENCOUNTER — Telehealth: Payer: Self-pay | Admitting: Adult Health

## 2020-06-06 ENCOUNTER — Other Ambulatory Visit: Payer: Self-pay | Admitting: Adult Health

## 2020-06-06 ENCOUNTER — Telehealth: Payer: Self-pay

## 2020-06-06 ENCOUNTER — Encounter: Payer: Self-pay | Admitting: Adult Health

## 2020-06-06 LAB — POCT URINALYSIS DIPSTICK
Bilirubin, UA: NEGATIVE
Blood, UA: NEGATIVE
Glucose, UA: NEGATIVE
Ketones, UA: NEGATIVE
Leukocytes, UA: NEGATIVE
Nitrite, UA: NEGATIVE
Protein, UA: NEGATIVE
Spec Grav, UA: >=1.03 — AB (ref 1.010–1.025)
Urobilinogen, UA: 0.2 E.U./dL
pH, UA: 6.5 (ref 5.0–8.0)

## 2020-06-06 NOTE — Telephone Encounter (Signed)
See the attached note regarding being seen as a new pt and forgot to mention she has migraines.   She is requesting Florinal and Imitrex nasal spray.     Thanks.

## 2020-06-06 NOTE — Telephone Encounter (Signed)
I have called in prescription to [facility] since computer does show prescription was printed, I discontinued all medications in chart besides the three that were ordered yesterday . KW

## 2020-06-06 NOTE — Telephone Encounter (Signed)
Does patinet have symptoms she did not report ? She had a neurosurgeon or surgery ? We need to obtain her records from previous offices if she did not already please have her fill out records requests for all specialists and care providers she has had.  She should be advised to schedule office visit if need further work up or concerns and as always emergency room if urgent symptoms.

## 2020-06-06 NOTE — Telephone Encounter (Signed)
Patient was seen as a New Patient appt and forgot to mention she suffers from migraines uses "Florinal" and Imitrex Nasal Spray.please advise      Preferred pharmacy  [facility]

## 2020-06-06 NOTE — Telephone Encounter (Signed)
Noted and thank you

## 2020-06-06 NOTE — Telephone Encounter (Signed)
Pt came in this morning to get her labs drawn and she also stated that her visit summary showed that she got a written script for fluticasone (FLONASE) 50 MCG/ACT nasal spray Pt is requesting the Rx be sent electronically to [facility] [address]  Pt also stated that she looked over her medication list and she is no longer taking any of those medications except for the 3 that Marcelino Duster started her on yesterday. Pt is requesting the other medications be removed off her active medication list. Please advise. Thanks TNP

## 2020-06-06 NOTE — Telephone Encounter (Signed)
I do not see on medication list from past reconciliation. Will wait on lab results  before prescribing any medications. She is going to be seeing neurology as well given reported history. She can try over the counters until then per package if needed.If headache occurs before then be evaluated in office or at appropriate medical facility.

## 2020-06-06 NOTE — Telephone Encounter (Signed)
Copied from CRM [id number]. 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 After her neck was aligned they found out that she has degenerative disk disease between disks T3 and T4. She states that she forgot to mention this to PCP during last visit. Please advise.

## 2020-06-06 NOTE — Telephone Encounter (Signed)
Spoke with patient on the phone, she states that her neck is constantly in pain and cracking, she has been managing pain with Tylenol but did not want to continue because of fear of side effects to liver on long term use of acetaminophen. Patient denies seeing neurosurgeon in past or surgery, patient stats that doctors popped her neck back into place. Patient states that she will be unable to get her medical records. Patient stated on the phone she hiked to [place] from [place] and that she left her records with her father who she states will not return her calls or texts. KW

## 2020-06-06 NOTE — Telephone Encounter (Signed)
Please review message below and advise. KW 

## 2020-06-06 NOTE — Telephone Encounter (Signed)
If she knows where she was seen in past for care she can do a medical records request and we can obtain records.  Follow up appointment advised.  ER for any emergent symptoms should be advised if occurs before available appointment or anytime.   Advised patient call the office or your primary care doctor for an appointment if no improvement within 72 hours or if any symptoms change or worsen at any time  Advised ER or urgent Care if after hours or on weekend. Call 911 for emergency symptoms at any time.Patinet verbalized understanding of all instructions given/reviewed and treatment plan and has no further questions or concerns at this time.

## 2020-06-07 LAB — URINE CULTURE: Organism ID, Bacteria: NO GROWTH

## 2020-06-07 NOTE — Progress Notes (Signed)
No growth in urine culture.

## 2020-06-09 LAB — B12 AND FOLATE PANEL
Folate: 13.2 ng/mL (ref >3.0)
Vitamin B-12: 1062 pg/mL (ref 232–1245)

## 2020-06-09 LAB — HEPATITIS PANEL, ACUTE
Hep A IgM: NEGATIVE
Hep B C IgM: NEGATIVE
Hep C Virus Ab: <0.1 s/co ratio (ref 0.0–0.9)
Hepatitis B Surface Ag: NEGATIVE

## 2020-06-09 LAB — SPECIMEN STATUS REPORT

## 2020-06-12 ENCOUNTER — Telehealth: Payer: Self-pay

## 2020-06-12 LAB — FSH/LH
FSH: 8 m[IU]/mL
LH: 11.1 m[IU]/mL

## 2020-06-12 LAB — LIPID PANEL
Chol/HDL Ratio: 2.8 ratio (ref 0.0–4.4)
Cholesterol, Total: 216 mg/dL — ABNORMAL HIGH (ref 100–199)
HDL: 77 mg/dL (ref >39)
LDL Chol Calc (NIH): 118 mg/dL — ABNORMAL HIGH (ref 0–99)
Triglycerides: 121 mg/dL (ref 0–149)
VLDL Cholesterol Cal: 21 mg/dL (ref 5–40)

## 2020-06-12 LAB — CBC WITH DIFFERENTIAL/PLATELET
Basophils Absolute: 0.1 10*3/uL (ref 0.0–0.2)
Basos: 1 %
EOS (ABSOLUTE): 0.1 10*3/uL (ref 0.0–0.4)
Eos: 1 %
Hematocrit: 30.5 % — ABNORMAL LOW (ref 34.0–46.6)
Hemoglobin: 10.6 g/dL — ABNORMAL LOW (ref 11.1–15.9)
Immature Grans (Abs): 0 10*3/uL (ref 0.0–0.1)
Immature Granulocytes: 0 %
Lymphocytes Absolute: 0.7 10*3/uL (ref 0.7–3.1)
Lymphs: 12 %
MCH: 35.2 pg — ABNORMAL HIGH (ref 26.6–33.0)
MCHC: 34.8 g/dL (ref 31.5–35.7)
MCV: 101 fL — ABNORMAL HIGH (ref 79–97)
Monocytes Absolute: 1 10*3/uL — ABNORMAL HIGH (ref 0.1–0.9)
Monocytes: 17 %
Neutrophils Absolute: 3.9 10*3/uL (ref 1.4–7.0)
Neutrophils: 69 %
Platelets: 248 10*3/uL (ref 150–450)
RBC: 3.01 x10E6/uL — ABNORMAL LOW (ref 3.77–5.28)
RDW: 13.3 % (ref 11.7–15.4)
WBC: 5.7 10*3/uL (ref 3.4–10.8)

## 2020-06-12 LAB — COMPREHENSIVE METABOLIC PANEL
ALT: 19 IU/L (ref 0–32)
AST: 51 IU/L — ABNORMAL HIGH (ref 0–40)
Albumin/Globulin Ratio: 1.1 — ABNORMAL LOW (ref 1.2–2.2)
Albumin: 3.8 g/dL (ref 3.8–4.8)
Alkaline Phosphatase: 199 IU/L — ABNORMAL HIGH (ref 48–121)
BUN/Creatinine Ratio: 13 (ref 9–23)
BUN: 4 mg/dL — ABNORMAL LOW (ref 6–24)
Bilirubin Total: 0.4 mg/dL (ref 0.0–1.2)
CO2: 21 mmol/L (ref 20–29)
Calcium: 8.8 mg/dL (ref 8.7–10.2)
Chloride: 101 mmol/L (ref 96–106)
Creatinine, Ser: 0.32 mg/dL — ABNORMAL LOW (ref 0.57–1.00)
GFR calc Af Amer: 162 mL/min/{1.73_m2} (ref >59)
GFR calc non Af Amer: 141 mL/min/{1.73_m2} (ref >59)
Globulin, Total: 3.4 g/dL (ref 1.5–4.5)
Glucose: 82 mg/dL (ref 65–99)
Potassium: 3.9 mmol/L (ref 3.5–5.2)
Sodium: 138 mmol/L (ref 134–144)
Total Protein: 7.2 g/dL (ref 6.0–8.5)

## 2020-06-12 LAB — DRUG SCREEN 10 W/CONF, SERUM
Amphetamines, IA: NEGATIVE ng/mL
Barbiturates, IA: NEGATIVE ug/mL
Benzodiazepines, IA: NEGATIVE ng/mL
Cocaine & Metabolite, IA: NEGATIVE ng/mL
Methadone, IA: NEGATIVE ng/mL
Opiates, IA: NEGATIVE ng/mL
Oxycodones, IA: NEGATIVE ng/mL
Phencyclidine, IA: NEGATIVE ng/mL
Propoxyphene, IA: NEGATIVE ng/mL
THC(Marijuana) Metabolite, IA: NEGATIVE ng/mL

## 2020-06-12 LAB — LIPASE: Lipase: 180 U/L — ABNORMAL HIGH (ref 14–72)

## 2020-06-12 LAB — TSH: TSH: 0.512 u[IU]/mL (ref 0.450–4.500)

## 2020-06-12 LAB — ETHANOL: Ethanol: 0.047 %

## 2020-06-12 LAB — B12 AND FOLATE PANEL
Folate: 12.3 ng/mL (ref >3.0)
Vitamin B-12: 995 pg/mL (ref 232–1245)

## 2020-06-12 LAB — ESTROGENS, TOTAL: Estrogen: 265 pg/mL

## 2020-06-12 LAB — AMYLASE: Amylase: 62 U/L (ref 31–110)

## 2020-06-12 NOTE — Telephone Encounter (Signed)
Left message for patient to contact office back, okay for [facility] triage nurse to advise patient of results. KW

## 2020-06-12 NOTE — Telephone Encounter (Signed)
Patient had been advised. KW

## 2020-06-12 NOTE — Telephone Encounter (Signed)
Katrina Turner,  Can we get patient in sooner with GI, for hernia mesh recheck, and chronic pancreatitis. Alcoholism, do not want her to wait until September, order was marked urgent for gastrointestinal MD.  I believe, however If not we can change.

## 2020-06-12 NOTE — Telephone Encounter (Signed)
Pt given result per Marcelino Duster Flinchum, "B12 was within normal limits. She is anemic - Is she taking any iron supplement ? Or B12 ? Any rectal bleeding, dark stools or vaginal/ oral bleeding ? Alkaline phos and AST elevated, avoid alcohol recommended she has chronic pancreatitis, lipase enzyme is also elevated. She should have heard from gastroenterology if she has not please follow up. She should be advised to keep that appointment. Serum drug testing negative. Total cholesterol and LDL elevated.  Discuss lifestyle modification with patient e.g. increase exercise, fiber, fruits, vegetables, lean meat, and omega 3/fish intake and decrease saturated fat.  Recheck in 6 months. TSH is low end normal. Amylase within normal. Hepatitis panel A, B and C within normal limits.  Recheck lipase, TSH, CBC, CMP in 3 weeks please add future labs and inform patient. 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

## 2020-06-12 NOTE — Progress Notes (Signed)
B12 was within normal limits. She is anemic - Is she taking any iron supplement ? Or B12 ? Any rectal bleeding, dark stools or vaginal/ oral bleeding ?   Alkaline phos and AST elevated, avoid alcohol recommended she has chronic pancreatitis, lipase enzyme is also elevated. She should have heard from gastroenterology if she has not please follow up. She should be advised to keep that appointment.   Serum drug testing negative.   Total cholesterol and LDL elevated.  Discuss lifestyle modification with patient e.g. increase exercise, fiber, fruits, vegetables, lean meat, and omega 3/fish intake and decrease saturated fat.  Recheck in 6 months.   TSH is low end normal.  Amylase within normal. Hepatitis panel A, B and C within normal limits.   Recheck lipase, TSH, CBC, CMP in 3 weeks please add future labs and inform patient. Keep follow up to discuss other concerns.

## 2020-06-12 NOTE — Telephone Encounter (Signed)
Given recent labs, need to get her in sooner with GI for her chronic pancreatitis Can place another urgent if needed to GI.   She has been urgently referred to psych given her being off medications for over a year.provider not comfortable prescribing given her complex psychiatric history and history of reported alcohol abuse and no medical records received.   Can give [facility] in [place] and [facility] in [place] if she needs care prior to her psychiatrist appointment. They have some walk in's and  numbers listed below.   Feel free to schedule follow up if needed.   Psychiatric/Counseling Resources Discussed As Follows:  If Emergency please seek Emergency Room Care Immediately or Call 911.   [facility] Address:  [address] [place], [place] [address] Phone: [phone] Website : [email or web address]   [facility] Address:  [address] [place], [place] [address] Phone: [phone] Fax: [phone] Website: [email or web address] How To Access Our Services Because our main goal is to meet the needs of our consumers, [facility] operates on a walk-in basis! To access services, there are just 3 easy steps: 1) Walk in any Monday, Wednesday or Friday between 8:00 am and 3:00 pm and complete our consumer paperwork 2) A Comprehensive Clinical Assessment (CCA) will be completed and appropriate service recommendations will be provided 3) Recommendations are sent to [facility] team members and the appropriate staff will call you within days. Advanced Access Open M - F, 8:00 am - 8:00 pm  Mental health crisis services for all age groups  Triage  Psychiatric Evaluations  Involuntary Commitments  [facility]  Address: [address] [place], [place] [address] Website : [email or web address] Walk in's accepted see web site or call for more information Phone : [phone] Also has [facility] [phone]    Psychology Today Find a therapist by searching online in your area or specialist by your diagnosis Website:  [email or web address]

## 2020-06-12 NOTE — Telephone Encounter (Signed)
-----   Message from Berniece Pap, FNP sent at 06/12/2020 10:22 AM EDT ----- B12 was within normal limits. She is anemic - Is she taking any iron supplement ? Or B12 ? Any rectal bleeding, dark stools or vaginal/ oral bleeding ?   Alkaline phos and AST elevated, avoid alcohol recommended she has chronic pancreatitis, lipase enzyme is also elevated. She should have heard from gastroenterology if she has not please follow up. She should be advised to keep that appointment.   Serum drug testing negative.   Total cholesterol and LDL elevated.  Discuss lifestyle modification with patient e.g. increase exercise, fiber, fruits, vegetables, lean meat, and omega 3/fish intake and decrease saturated fat.  Recheck in 6 months.   TSH is low end normal.  Amylase within normal. Hepatitis panel A, B and C within normal limits.   Recheck lipase, TSH, CBC, CMP in 3 weeks please add future labs and inform patient. Keep follow up to discuss other concerns.

## 2020-06-12 NOTE — Telephone Encounter (Signed)
See previous telephone encounter marked from today, message is being addressed. KW

## 2020-06-12 NOTE — Telephone Encounter (Signed)
Please review patient response below and advise. KW

## 2020-06-12 NOTE — Telephone Encounter (Signed)
Patient requesting an in office appointment for psych meds. Please advise if she can be worked in before June 29, 2020.

## 2020-06-13 ENCOUNTER — Other Ambulatory Visit: Payer: Self-pay | Admitting: Adult Health

## 2020-06-13 DIAGNOSIS — Z9889 Other specified postprocedural states: Secondary | ICD-10-CM

## 2020-06-13 DIAGNOSIS — Z789 Other specified health status: Secondary | ICD-10-CM

## 2020-06-13 DIAGNOSIS — Z9884 Bariatric surgery status: Secondary | ICD-10-CM

## 2020-06-13 DIAGNOSIS — R748 Abnormal levels of other serum enzymes: Secondary | ICD-10-CM

## 2020-06-13 NOTE — Telephone Encounter (Signed)
Maralyn Sago got patient in touch with [facility] in [place].

## 2020-06-14 LAB — DRUG SCREEN 12+ALCOHOL+CRT, UR
Amphetamines, Urine: NEGATIVE ng/mL
BENZODIAZ UR QL: NEGATIVE ng/mL
Barbiturate: NEGATIVE ng/mL
Cannabinoids: NEGATIVE ng/mL
Cocaine (Metabolite): NEGATIVE ng/mL
Creatinine, Urine: 22.9 mg/dL (ref 20.0–300.0)
Ethanol, Urine: POSITIVE — AB
Meperidine: NEGATIVE ng/mL
Methadone: NEGATIVE ng/mL
OPIATE SCREEN URINE: NEGATIVE ng/mL
Oxycodone/Oxymorphone, Urine: NEGATIVE ng/mL
Phencyclidine: NEGATIVE ng/mL
Propoxyphene: NEGATIVE ng/mL
Tramadol: NEGATIVE ng/mL

## 2020-06-14 NOTE — Progress Notes (Signed)
Positive alcohol in urine. Negative drug other drug testing.

## 2020-06-20 ENCOUNTER — Encounter: Payer: 59 | Admitting: Obstetrics & Gynecology

## 2020-06-27 ENCOUNTER — Ambulatory Visit: Payer: Self-pay | Admitting: Gastroenterology

## 2020-07-06 ENCOUNTER — Other Ambulatory Visit: Payer: Self-pay | Admitting: Adult Health

## 2020-07-06 DIAGNOSIS — Z789 Other specified health status: Secondary | ICD-10-CM

## 2020-07-06 DIAGNOSIS — K861 Other chronic pancreatitis: Secondary | ICD-10-CM

## 2020-07-06 DIAGNOSIS — R748 Abnormal levels of other serum enzymes: Secondary | ICD-10-CM

## 2020-07-07 LAB — CBC WITH DIFFERENTIAL/PLATELET
Basophils Absolute: 0.1 10*3/uL (ref 0.0–0.2)
Basos: 1 %
EOS (ABSOLUTE): 0.2 10*3/uL (ref 0.0–0.4)
Eos: 3 %
Hematocrit: 31.5 % — ABNORMAL LOW (ref 34.0–46.6)
Hemoglobin: 10.1 g/dL — ABNORMAL LOW (ref 11.1–15.9)
Immature Grans (Abs): 0 10*3/uL (ref 0.0–0.1)
Immature Granulocytes: 0 %
Lymphocytes Absolute: 2.2 10*3/uL (ref 0.7–3.1)
Lymphs: 31 %
MCH: 31.5 pg (ref 26.6–33.0)
MCHC: 32.1 g/dL (ref 31.5–35.7)
MCV: 98 fL — ABNORMAL HIGH (ref 79–97)
Monocytes Absolute: 1.3 10*3/uL — ABNORMAL HIGH (ref 0.1–0.9)
Monocytes: 19 %
Neutrophils Absolute: 3.2 10*3/uL (ref 1.4–7.0)
Neutrophils: 46 %
Platelets: 323 10*3/uL (ref 150–450)
RBC: 3.21 x10E6/uL — ABNORMAL LOW (ref 3.77–5.28)
RDW: 13.8 % (ref 11.7–15.4)
WBC: 7.1 10*3/uL (ref 3.4–10.8)

## 2020-07-07 LAB — COMPREHENSIVE METABOLIC PANEL
ALT: 40 IU/L — ABNORMAL HIGH (ref 0–32)
AST: 81 IU/L — ABNORMAL HIGH (ref 0–40)
Albumin/Globulin Ratio: 1 — ABNORMAL LOW (ref 1.2–2.2)
Albumin: 3.2 g/dL — ABNORMAL LOW (ref 3.8–4.8)
Alkaline Phosphatase: 244 IU/L — ABNORMAL HIGH (ref 48–121)
BUN/Creatinine Ratio: 11 (ref 9–23)
BUN: 4 mg/dL — ABNORMAL LOW (ref 6–24)
Bilirubin Total: 0.2 mg/dL (ref 0.0–1.2)
CO2: 23 mmol/L (ref 20–29)
Calcium: 8.5 mg/dL — ABNORMAL LOW (ref 8.7–10.2)
Chloride: 98 mmol/L (ref 96–106)
Creatinine, Ser: 0.36 mg/dL — ABNORMAL LOW (ref 0.57–1.00)
GFR calc Af Amer: 156 mL/min/{1.73_m2} (ref >59)
GFR calc non Af Amer: 135 mL/min/{1.73_m2} (ref >59)
Globulin, Total: 3.2 g/dL (ref 1.5–4.5)
Glucose: 87 mg/dL (ref 65–99)
Potassium: 4.2 mmol/L (ref 3.5–5.2)
Sodium: 136 mmol/L (ref 134–144)
Total Protein: 6.4 g/dL (ref 6.0–8.5)

## 2020-07-07 LAB — TSH: TSH: 3.64 u[IU]/mL (ref 0.450–4.500)

## 2020-07-07 LAB — LIPASE: Lipase: 74 U/L — ABNORMAL HIGH (ref 14–72)

## 2020-07-10 NOTE — Progress Notes (Signed)
Please add on iron tibc lab for diagnosis of anemia.   Elevated liver enzymes, anemia worsened.   . She should have an appointment scheduled with Gastroenterologist already please verify she has chronic pancreatitis  ? TSH within normal limits.

## 2020-07-11 ENCOUNTER — Other Ambulatory Visit: Payer: Self-pay | Admitting: Adult Health

## 2020-07-11 DIAGNOSIS — D649 Anemia, unspecified: Secondary | ICD-10-CM | POA: Insufficient documentation

## 2020-07-11 NOTE — Progress Notes (Signed)
Will refer to hematology TIBC low, iron level within normal limits, questionable anemia chronic disease/ versus hemolytic anemia. Would like hematology to evaluate. She should hear within 2 weeks from referral for appointment. Recommend alcohol cessation.

## 2020-07-11 NOTE — Progress Notes (Signed)
Orders Placed This Encounter  Procedures  . Ambulatory referral to Hematology    Referral Priority:   Routine    Referral Type:   Consultation    Referral Reason:   Specialty Services Required    Requested Specialty:   Oncology    Number of Visits Requested:   1    

## 2020-07-18 ENCOUNTER — Telehealth: Payer: Self-pay

## 2020-07-18 LAB — IRON AND TIBC
Iron Saturation: 23 % (ref 15–55)
Iron: 55 ug/dL (ref 27–159)
Total Iron Binding Capacity: 243 ug/dL — ABNORMAL LOW (ref 250–450)
UIBC: 188 ug/dL (ref 131–425)

## 2020-07-18 LAB — SPECIMEN STATUS REPORT

## 2020-07-18 NOTE — Telephone Encounter (Signed)
Copied from CRM [id number]. Topic: General - Call Back - No Documentation >> Jul 17, 2020  4:38 PM Randol Kern wrote: Reason for CRM: Pt wants PCP advice, she wants to know if it is safe for her to receive the Covid 19 vaccine. Please advise, she has an auto immune disease  Best contact: [phone]

## 2020-07-18 NOTE — Telephone Encounter (Signed)
COVID-19 Vaccine Information can be found at: [email or web address] For questions related to vaccine distribution or appointments, please email [email or web address] or call [phone].    

## 2020-07-19 DIAGNOSIS — R519 Headache, unspecified: Secondary | ICD-10-CM | POA: Insufficient documentation

## 2020-07-19 DIAGNOSIS — R200 Anesthesia of skin: Secondary | ICD-10-CM | POA: Insufficient documentation

## 2020-07-19 DIAGNOSIS — R202 Paresthesia of skin: Secondary | ICD-10-CM | POA: Insufficient documentation

## 2020-07-31 ENCOUNTER — Other Ambulatory Visit: Payer: Self-pay | Admitting: Family Medicine

## 2020-07-31 MED ORDER — ALBUTEROL SULFATE HFA 108 (90 BASE) MCG/ACT IN AERS: 1.0000 | INHALATION_SPRAY | Freq: Four times a day (QID) | RESPIRATORY_TRACT | 1 refills | Status: DC | PRN

## 2020-07-31 NOTE — Progress Notes (Signed)
Fax req from [place] [facility] that patient requests albuterol prescription be transferred from [facility]

## 2020-08-08 DIAGNOSIS — M79605 Pain in left leg: Secondary | ICD-10-CM | POA: Insufficient documentation

## 2020-08-08 DIAGNOSIS — M79604 Pain in right leg: Secondary | ICD-10-CM | POA: Insufficient documentation

## 2020-08-16 ENCOUNTER — Ambulatory Visit: Payer: Self-pay | Admitting: Gastroenterology

## 2020-08-20 ENCOUNTER — Emergency Department
Admission: EM | Admit: 2020-08-20 | Discharge: 2020-08-21 | Disposition: A | Discharge status: Home / Self Care | Payer: 59 | Attending: Emergency Medicine | Admitting: Emergency Medicine

## 2020-08-20 ENCOUNTER — Other Ambulatory Visit: Payer: Self-pay

## 2020-08-20 DIAGNOSIS — Z20822 Contact with and (suspected) exposure to covid-19: Secondary | ICD-10-CM | POA: Insufficient documentation

## 2020-08-20 DIAGNOSIS — F29 Unspecified psychosis not due to a substance or known physiological condition: Secondary | ICD-10-CM | POA: Diagnosis not present

## 2020-08-20 DIAGNOSIS — F1510 Other stimulant abuse, uncomplicated: Secondary | ICD-10-CM | POA: Insufficient documentation

## 2020-08-20 DIAGNOSIS — Y900 Blood alcohol level of less than 20 mg/100 ml: Secondary | ICD-10-CM | POA: Diagnosis not present

## 2020-08-20 DIAGNOSIS — R440 Auditory hallucinations: Secondary | ICD-10-CM | POA: Diagnosis present

## 2020-08-20 DIAGNOSIS — F17210 Nicotine dependence, cigarettes, uncomplicated: Secondary | ICD-10-CM | POA: Insufficient documentation

## 2020-08-20 DIAGNOSIS — F1020 Alcohol dependence, uncomplicated: Secondary | ICD-10-CM | POA: Insufficient documentation

## 2020-08-20 LAB — SARS CORONAVIRUS 2 BY RT PCR (HOSPITAL ORDER, PERFORMED IN ~~LOC~~ HOSPITAL LAB): SARS Coronavirus 2: NEGATIVE

## 2020-08-20 LAB — COMPREHENSIVE METABOLIC PANEL
ALT: 27 U/L (ref 0–44)
AST: 49 U/L — ABNORMAL HIGH (ref 15–41)
Albumin: 3.5 g/dL (ref 3.5–5.0)
Alkaline Phosphatase: 188 U/L — ABNORMAL HIGH (ref 38–126)
Anion gap: 11 (ref 5–15)
BUN: 7 mg/dL (ref 6–20)
CO2: 25 mmol/L (ref 22–32)
Calcium: 9.5 mg/dL (ref 8.9–10.3)
Chloride: 94 mmol/L — ABNORMAL LOW (ref 98–111)
Creatinine, Ser: 0.76 mg/dL (ref 0.44–1.00)
GFR calc Af Amer: >60 mL/min (ref >60)
GFR calc non Af Amer: >60 mL/min (ref >60)
Glucose, Bld: 92 mg/dL (ref 70–99)
Potassium: 4.7 mmol/L (ref 3.5–5.1)
Sodium: 130 mmol/L — ABNORMAL LOW (ref 135–145)
Total Bilirubin: 0.9 mg/dL (ref 0.3–1.2)
Total Protein: 8.2 g/dL — ABNORMAL HIGH (ref 6.5–8.1)

## 2020-08-20 LAB — URINE DRUG SCREEN, QUALITATIVE (ARMC ONLY)
Amphetamines, Ur Screen: POSITIVE — AB
Barbiturates, Ur Screen: NOT DETECTED
Benzodiazepine, Ur Scrn: NOT DETECTED
Cannabinoid 50 Ng, Ur ~~LOC~~: NOT DETECTED
Cocaine Metabolite,Ur ~~LOC~~: NOT DETECTED
MDMA (Ecstasy)Ur Screen: NOT DETECTED
Methadone Scn, Ur: NOT DETECTED
Opiate, Ur Screen: NOT DETECTED
Phencyclidine (PCP) Ur S: NOT DETECTED
Tricyclic, Ur Screen: NOT DETECTED

## 2020-08-20 LAB — CBC
HCT: 36.4 % (ref 36.0–46.0)
Hemoglobin: 12.3 g/dL (ref 12.0–15.0)
MCH: 30.3 pg (ref 26.0–34.0)
MCHC: 33.8 g/dL (ref 30.0–36.0)
MCV: 89.7 fL (ref 80.0–100.0)
Platelets: 370 10*3/uL (ref 150–400)
RBC: 4.06 MIL/uL (ref 3.87–5.11)
RDW: 15.8 % — ABNORMAL HIGH (ref 11.5–15.5)
WBC: 9 10*3/uL (ref 4.0–10.5)
nRBC: 0 % (ref 0.0–0.2)

## 2020-08-20 LAB — ACETAMINOPHEN LEVEL: Acetaminophen (Tylenol), Serum: <10 ug/mL — ABNORMAL LOW (ref 10–30)

## 2020-08-20 LAB — ETHANOL: Alcohol, Ethyl (B): <10 mg/dL (ref <10)

## 2020-08-20 LAB — SALICYLATE LEVEL: Salicylate Lvl: <7 mg/dL — ABNORMAL LOW (ref 7.0–30.0)

## 2020-08-20 MED ORDER — LOPERAMIDE HCL 2 MG PO CAPS
2.0000 mg | ORAL_CAPSULE | ORAL | Status: DC | PRN
  Filled 2020-08-20: qty 2

## 2020-08-20 MED ORDER — SUCRALFATE 1 GM/10ML PO SUSP
1.0000 g | Freq: Two times a day (BID) | ORAL | Status: DC
  Administered 2020-08-20 – 2020-08-21 (×2): 1 g via ORAL
  Filled 2020-08-20 (×3): qty 10

## 2020-08-20 MED ORDER — CLONAZEPAM 0.5 MG PO TABS
0.5000 mg | ORAL_TABLET | Freq: Two times a day (BID) | ORAL | Status: DC
  Administered 2020-08-21: 0.5 mg via ORAL
  Filled 2020-08-20 (×2): qty 1

## 2020-08-20 MED ORDER — LORAZEPAM 2 MG/ML IJ SOLN: 0.0000 mg | Freq: Four times a day (QID) | INTRAMUSCULAR | Status: DC

## 2020-08-20 MED ORDER — DICYCLOMINE HCL 20 MG PO TABS
20.0000 mg | ORAL_TABLET | Freq: Four times a day (QID) | ORAL | Status: DC | PRN
  Filled 2020-08-20: qty 1

## 2020-08-20 MED ORDER — CLONIDINE HCL 0.1 MG PO TABS: 0.1000 mg | ORAL_TABLET | Freq: Every day | ORAL | Status: DC

## 2020-08-20 MED ORDER — HYDROXYZINE HCL 25 MG PO TABS: 25.0000 mg | ORAL_TABLET | Freq: Four times a day (QID) | ORAL | Status: DC | PRN

## 2020-08-20 MED ORDER — THIAMINE HCL 100 MG PO TABS
100.0000 mg | ORAL_TABLET | Freq: Every day | ORAL | Status: DC
  Administered 2020-08-20 – 2020-08-21 (×2): 100 mg via ORAL
  Filled 2020-08-20 (×2): qty 1

## 2020-08-20 MED ORDER — CLONIDINE HCL 0.1 MG PO TABS
0.1000 mg | ORAL_TABLET | Freq: Four times a day (QID) | ORAL | Status: DC
  Administered 2020-08-20 – 2020-08-21 (×3): 0.1 mg via ORAL
  Filled 2020-08-20 (×3): qty 1

## 2020-08-20 MED ORDER — LORAZEPAM 2 MG PO TABS: 0.0000 mg | ORAL_TABLET | Freq: Two times a day (BID) | ORAL | Status: DC

## 2020-08-20 MED ORDER — LORAZEPAM 2 MG PO TABS
0.0000 mg | ORAL_TABLET | Freq: Four times a day (QID) | ORAL | Status: DC
  Administered 2020-08-20: 1 mg via ORAL
  Filled 2020-08-20: qty 1

## 2020-08-20 MED ORDER — ACETAMINOPHEN 325 MG PO TABS: 650.0000 mg | ORAL_TABLET | ORAL | Status: DC | PRN

## 2020-08-20 MED ORDER — THIAMINE HCL 100 MG/ML IJ SOLN: 100.0000 mg | Freq: Every day | INTRAMUSCULAR | Status: DC

## 2020-08-20 MED ORDER — LORAZEPAM 2 MG/ML IJ SOLN: 0.0000 mg | Freq: Two times a day (BID) | INTRAMUSCULAR | Status: DC

## 2020-08-20 MED ORDER — LEVETIRACETAM 500 MG PO TABS
500.0000 mg | ORAL_TABLET | Freq: Two times a day (BID) | ORAL | Status: DC
  Administered 2020-08-20 – 2020-08-21 (×2): 500 mg via ORAL
  Filled 2020-08-20 (×3): qty 1

## 2020-08-20 MED ORDER — ACETAMINOPHEN 325 MG PO TABS
650.0000 mg | ORAL_TABLET | Freq: Once | ORAL | Status: AC
  Administered 2020-08-20: 650 mg via ORAL
  Filled 2020-08-20: qty 2

## 2020-08-20 MED ORDER — ONDANSETRON HCL 4 MG PO TABS: 4.0000 mg | ORAL_TABLET | Freq: Three times a day (TID) | ORAL | Status: DC | PRN

## 2020-08-20 MED ORDER — ALUM & MAG HYDROXIDE-SIMETH 200-200-20 MG/5ML PO SUSP: 30.0000 mL | Freq: Four times a day (QID) | ORAL | Status: DC | PRN

## 2020-08-20 MED ORDER — OLANZAPINE 5 MG PO TABS
5.0000 mg | ORAL_TABLET | Freq: Every day | ORAL | Status: DC
  Administered 2020-08-20: 5 mg via ORAL
  Filled 2020-08-20: qty 1

## 2020-08-20 MED ORDER — CLONIDINE HCL 0.1 MG PO TABS: 0.1000 mg | ORAL_TABLET | ORAL | Status: DC

## 2020-08-20 MED ORDER — METHOCARBAMOL 500 MG PO TABS
500.0000 mg | ORAL_TABLET | Freq: Three times a day (TID) | ORAL | Status: DC | PRN
  Filled 2020-08-20: qty 1

## 2020-08-20 MED ORDER — HALOPERIDOL 0.5 MG PO TABS
0.2500 mg | ORAL_TABLET | Freq: Three times a day (TID) | ORAL | Status: DC
  Administered 2020-08-20 – 2020-08-21 (×3): 0.25 mg via ORAL
  Filled 2020-08-20 (×3): qty 1

## 2020-08-20 MED ORDER — ONDANSETRON 4 MG PO TBDP
4.0000 mg | ORAL_TABLET | Freq: Four times a day (QID) | ORAL | Status: DC | PRN
  Filled 2020-08-20: qty 1

## 2020-08-20 NOTE — ED Notes (Signed)
Patient transferred to room 5 in the [facility], Nurse received report from [facility], she is calm and cooperative, she wants to watch tv, will continue to monitor.
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
## 2020-08-20 NOTE — ED Notes (Signed)
Pt dressed out, the following items placed in one of one labeled bag: two grey metal rings, one with a clear stone, one grey metal chain with moon charm, green thong, grey shorts, phone, black tank top, white socks, white shoes.

## 2020-08-20 NOTE — ED Notes (Signed)
Pt provided lunch tray.

## 2020-08-20 NOTE — ED Triage Notes (Signed)
Pt here with [facility] with ivc papers. Pt denies SI. Pt states she was having a panic attack and her friends were under the floor trying to get her from below. Pt appears under the influence of a substance, but is cooperative.

## 2020-08-20 NOTE — ED Provider Notes (Addendum)
[facility] Emergency Department Provider Note   ____________________________________________   First MD Initiated Contact with Patient 08/20/20 1124     (approximate)  I have reviewed the triage vital signs and the nursing notes.   HISTORY  Chief Complaint IVC    HPI Katrina Turner is a 39 y.o. female with a past medical history of schizophrenia who presents under IVC for psychosis.  Her IVC "respondent is currently hearing voices and is not taking her prescribed medication for schizophrenia.  She is under mental duress possibly self-medicating with narcotics".  Patient states that she is having a panic attack and that her friends are "under the floor trying to get her".  Patient admits to methamphetamine use daily with last stated use 24 hours prior to arrival.  Further history review of systems unable to obtain due to mental status         Past Medical History:  Diagnosis Date  . Allergy   . Bipolar 1 disorder (HCC)   . Depression   . Neuromuscular disorder (HCC)   . Pancreatitis 11/28/2019   approximated   . PTSD (post-traumatic stress disorder)   . Reynolds syndrome [facility])     Patient Active Problem List   Diagnosis Date Noted  . Low hemoglobin 07/11/2020  . 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 Hot flashes 06/05/2020  . Full dentures 06/05/2020  . Chronic pancreatitis (HCC) 06/05/2020  . Chronic migraine without aura without status migrainosus, not intractable 06/05/2020  . Seasonal allergies 06/05/2020  . High risk medication use 06/05/2020  . Personality disorder (HCC) 06/05/2020  . Urinary symptom or sign 06/05/2020  . Skin lesion 06/05/2020  . Alcohol abuse 06/27/2019  . Bipolar 1 disorder (HCC) 05/24/2019  . H/O gastric bypass 05/24/2019  . Acute pancreatitis 05/24/2019  . Alcohol use 05/24/2019  . Tobacco use disorder 03/04/2019  . Abdominal pain 03/03/2019  . Lupus (HCC) 03/03/2019  . Raynaud's phenomenon  without gangrene 03/03/2019    Past Surgical History:  Procedure Laterality Date  . APPENDECTOMY    . CESAREAN SECTION    . HERNIA REPAIR    . PARTIAL HYSTERECTOMY     ovaries present not uterus- precancerous cells of uterus   . ROUX-EN-Y GASTRIC BYPASS      Prior to Admission medications   Medication Sig Start Date End Date Taking? Authorizing Provider  cetirizine (ZYRTEC ALLERGY) 10 MG tablet Take 1 tablet (10 mg total) by mouth daily. 06/05/20  Yes Flinchum, Eula Fried, FNP  fluticasone (FLONASE) 50 MCG/ACT nasal spray Place 1 spray into both nostrils daily. 06/05/20  Yes Flinchum, Eula Fried, FNP  nortriptyline (PAMELOR) 10 MG capsule Take 10 mg by mouth 2 (two) times daily. 07/19/20  Yes [provider]  albuterol (VENTOLIN HFA) 108 (90 Base) MCG/ACT inhaler Inhale 1-2 puffs into the lungs every 6 (six) hours as needed for wheezing or shortness of breath (excercise induced asthma she 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 07/31/20   Malva Limes, MD    Allergies Other, Bee venom, Depakote er Mliss Sax sodium er], Ivp dye [iodinated diagnostic agents], Nsaids, and Tegretol [carbamazepine]  Family History  Problem Relation Age of Onset  . Diabetes Father   . Heart disease Father   . Autism Son   . Pancreatic cancer Maternal Grandmother   . Heart disease Maternal Grandfather   . Seizures Paternal Grandmother   . Prostate cancer Paternal Grandmother   . Heart disease Paternal Grandfather     Social History Social  History   Tobacco Use  . Smoking status: Current Every Day Smoker    Packs/day: 1.00    Types: Cigarettes  . Smokeless tobacco: Never Used  Vaping Use  . Vaping Use: Never used  Substance Use Topics  . Alcohol use: Yes    Alcohol/week: 35.0 standard drinks    Types: 35 Cans of beer per week    Comment: pt states a qt. a day  . Drug use: Never    Review of Systems Unable to assess secondary to mental  status   ____________________________________________   PHYSICAL EXAM:  VITAL SIGNS: ED Triage Vitals  Enc Vitals Group     BP 08/20/20 0417 (!) 127/91     Pulse Rate 08/20/20 0417 (!) 113     Resp 08/20/20 0417 18     Temp 08/20/20 0417 98.9 F (37.2 C)     Temp Source 08/20/20 0417 Oral     SpO2 08/20/20 0417 98 %     Weight 08/20/20 0418 150 lb (68 kg)     Height 08/20/20 0418 5\' 7"  (1.702 m)     Head Circumference --      Peak Flow --      Pain Score 08/20/20 0418 0     Pain Loc --      Pain Edu? --      Excl. in GC? --    Constitutional: Alert and oriented. Well appearing and in no acute distress. Eyes: Conjunctivae are normal. PERRL. EOMI. Head: Atraumatic. Nose: No congestion/rhinnorhea. Mouth/Throat: Mucous membranes are moist. Neck: No stridor Cardiovascular: Normal rate, regular rhythm. Grossly normal heart sounds.  Good peripheral circulation. Respiratory: Normal respiratory effort.  No retractions. Gastrointestinal: Soft and nontender. No distention. Musculoskeletal: No lower extremity tenderness nor edema.  No joint effusions. Neurologic: Pressured speech and language. No gross focal neurologic deficits are appreciated. Skin:  Skin is warm and dry. No rash noted. Psychiatric: Mood and affect are normal.  Pressured speech and erratic behavior  ____________________________________________   LABS (all labs ordered are listed, but only abnormal results are displayed)  Labs Reviewed  COMPREHENSIVE METABOLIC PANEL - Abnormal; Notable for the following components:      Result Value   Sodium 130 (*)    Chloride 94 (*)    Total Protein 8.2 (*)    AST 49 (*)    Alkaline Phosphatase 188 (*)    All other components within normal limits  SALICYLATE LEVEL - Abnormal; Notable for the following components:   Salicylate Lvl <7.0 (*)    All other components within normal limits  ACETAMINOPHEN LEVEL - Abnormal; Notable for the following components:   Acetaminophen  (Tylenol), Serum <10 (*)    All other components within normal limits  CBC - Abnormal; Notable for the following components:   RDW 15.8 (*)    All other components within normal limits  SARS CORONAVIRUS 2 BY RT PCR (HOSPITAL ORDER, PERFORMED IN [facility])  ETHANOL  URINE DRUG SCREEN, QUALITATIVE ([facility] ONLY)  POC URINE PREG, ED   __________________________________________________________EKG: ED ECG REPORT I, 08/22/20, the attending physician, personally viewed and interpreted this ECG.  Date: 08/20/2020 EKG Time: 1419 Rate: 81 Rhythm: normal sinus rhythm QRS Axis: normal Intervals: normal ST/T Wave abnormalities: normal Narrative Interpretation: no evidence of acute ischemia ____________________   PROCEDURES  Procedure(s) performed (including Critical Care):  Procedures   ____________________________________________   INITIAL IMPRESSION / ASSESSMENT AND PLAN / ED COURSE  Patient presents under involuntary commitment due to psychosis and methamphetamine abuse.  Thoughts are disorganized. No history of prior suicide attempt, and no SI or HI at this time. Clinically w/ no overt toxidrome, low suspicion for ingestion given hx and exam Thoughts unlikely 2/2 anemia, hypothyroidism, infection, or ICH. Patient's decision making capacity is compromised and they are unable to perform all ADL's (additionally they are without appropriate caretakers to assist through this deficit).  Consult: Psychiatry to evaluate patient for grave disability Disposition: Pending psychiatric evaluation      ____________________________________________   FINAL CLINICAL IMPRESSION(S) / ED DIAGNOSES  Final diagnoses:  Psychosis, unspecified psychosis type (HCC)  Methamphetamine abuse [facility])     ED Discharge Orders    None       Note:  This document was prepared using Dragon voice recognition software and may include unintentional dictation errors.    Merwyn Katos, MD 08/20/20 1420    Merwyn Katos, MD 08/20/20 [phone]

## 2020-08-20 NOTE — BH Assessment (Addendum)
Assessment Note  Katrina Turner is an 39 y.o. female who presents to [facility] ED involuntarily for treatment. Per triage note, Pt here with [place] with ivc papers. Pt denies SI. Pt states she was having a panic attack and her friends were under the floor trying to get her from below. Pt appears under the influence of a substance, but is cooperative.  During TTS assessment pt presents calm, tangential and oriented x 2, restless but cooperative, and mood-congruent with affect. The pt does not appear to be responding to internal or external stimuli. Neither is the pt presenting with any delusional thinking. Pt is currently unable to verify the information provided to triage RN due to her disorganized thought process. Pt also, appears to be under the influence of a substance but it is unclear of what substance at this time. Pt was vague and unclear to Turner assessment questions but able to answer some questions. Pt reports to only be here because of PD and was unclear about the events that led up to PD being contacted. Pt reports to currently live with her spouse Katrina Turner) in an unknown hotel in [place], [place]. Pt reports a MH hx of postpartum depression, panic attacks and noncompliance with medications for 3 years. Pt reports an INPT hx in [place] for depression but is currently unable to provide more information time. Pt reports an OPT hx with [facility] in [place] but is currently unable to provide further information at this time. Pt reports to be unclear of her family hx MH/SA hx. Pt denies any recent substance use and states "I've been her for a few days". Per pt chart, pt was admitted today. PT reports recent use of Meth and to be drinking a case of beer a day. Due to pt presenting with a tangential and incoherent thought process, Pt is currently unable provide any further information. TTS stopped assessment.   Per Dr. Smith Robert pt is recommended for detox treatment leading to possible INPT once medically  cleared  Diagnosis: ETOH Dependence   Past Medical History:  Past Medical History:  Diagnosis Date  . Allergy   . Bipolar 1 disorder (HCC)   . Depression   . Neuromuscular disorder (HCC)   . Pancreatitis 11/28/2019   approximated   . PTSD (post-traumatic stress disorder)   . Reynolds syndrome [facility])     Past Surgical History:  Procedure Laterality Date  . APPENDECTOMY    . CESAREAN SECTION    . HERNIA REPAIR    . PARTIAL HYSTERECTOMY     ovaries present not uterus- precancerous cells of uterus   . ROUX-EN-Y GASTRIC BYPASS      Family History:  Family History  Problem Relation Age of Onset  . Diabetes Father   . Heart disease Father   . Autism Son   . Pancreatic cancer Maternal Grandmother   . Heart disease Maternal Grandfather   . Seizures Paternal Grandmother   . Prostate cancer Paternal Grandmother   . Heart disease Paternal Grandfather     Social History:  reports that she has been smoking cigarettes. She has been smoking about 1.00 pack per day. She has never used smokeless tobacco. She reports current alcohol use of about 35.0 standard drinks of alcohol per week. She reports that she does not use drugs.  Additional Social History:  Alcohol / Drug Use Pain Medications: see mar Prescriptions: see mar Over the Counter: see mar History of alcohol / drug use?: Yes Substance #1 Name of Substance  1: Meth Substance #2 Name of Substance 2: Alcohol  CIWA: CIWA-Ar BP: 130/86 Pulse Rate: 96 COWS:    Allergies:  Allergies  Allergen Reactions  . Other Swelling    Throat swelling per pt  . Bee Venom   . Depakote Er [Divalproex Sodium Er]   . Ivp Dye [Iodinated Diagnostic Agents]   . Nsaids   . Tegretol [Carbamazepine]     Home Medications: (Not in a hospital admission)   OB/GYN Status:  No LMP recorded. Patient has had a hysterectomy.  General Assessment Data Location of Assessment: [facility] ED TTS Assessment: In system Is this a Tele or Face-to-Face  Assessment?: Face-to-Face Is this an Initial Assessment or a Re-assessment for this encounter?: Initial Assessment Patient Accompanied by:: N/A Language Other than English: No Living Arrangements: Other (Comment) What gender do you identify as?: Female Date Telepsych consult ordered in [facility]: 08/20/20 Time Telepsych consult ordered in [facility]: 0408 Marital status: Married Jordan name: unknown Pregnancy Status: No Living Arrangements: Spouse/significant other (boyfriend ) Can pt return to current living arrangement?: Yes Admission Status: Involuntary Petitioner: Police Is patient capable of signing voluntary admission?: No Referral Source: Other Insurance type: [employer]      Crisis Care Plan Living Arrangements: Spouse/significant other (boyfriend ) Legal Guardian:  (self) Name of Psychiatrist: None reported  Name of Therapist: None reported   Education Status Is patient currently in school?: No Is the patient employed, unemployed or receiving disability?:  (UTA)  Risk to self with the past 6 months Suicidal Ideation:  (UTA) Has patient been a risk to self within the past 6 months prior to admission? : No Suicidal Intent:  (UTA) Has patient had any suicidal intent within the past 6 months prior to admission? : No Is patient at risk for suicide?: No, but patient needs Medical Clearance Suicidal Plan?:  (UTA) Has patient had any suicidal plan within the past 6 months prior to admission? :  (UTA) Access to Means:  (UTA) What has been your use of drugs/alcohol within the last 12 months?: Meth & Alcohol Previous Attempts/Gestures: No How many times?: 0 Other Self Harm Risks: UTA Triggers for Past Attempts:  (UTA) Intentional Self Injurious Behavior:  (UTA) Family Suicide History: Unable to assess Recent stressful life event(s):  (UTA) Persecutory voices/beliefs?:  (UTA) Depression:  (UTA) Depression Symptoms:  (UTA) Substance abuse history and/or treatment for substance  abuse?: Yes Suicide prevention information given to non-admitted patients: Not applicable  Risk to Others within the past 6 months Homicidal Ideation:  (UTA) Does patient have any lifetime risk of violence toward others beyond the six months prior to admission? : Unknown Thoughts of Harm to Others:  (UTA) Current Homicidal Intent:  (UTA) Current Homicidal Plan:  (UTA) Access to Homicidal Means:  (UTA) History of harm to others?:  (UTA) Assessment of Violence: None Noted Violent Behavior Description: N/A Does patient have access to weapons?:  (UTA) Criminal Charges Pending?:  (UTA) Does patient have a court date:  (UTA) Is patient on probation?: Unknown  Psychosis Hallucinations: Auditory (Per IVC) Delusions: Unspecified (Per Triage note)  Mental Status Report Appearance/Hygiene: In scrubs Eye Contact: Poor Motor Activity: Rigidity, Restlessness, Unsteady Speech: Slurred, Incoherent, Tangential Level of Consciousness: Restless Mood: Anxious Affect: Anxious, Inconsistent with thought content Anxiety Level: Minimal Thought Processes: Tangential, Thought Blocking Judgement: Impaired Obsessive Compulsive Thoughts/Behaviors: None  Cognitive Functioning Concentration: Poor Memory: Recent Impaired, Remote Impaired Is patient IDD: No Insight: Poor Impulse Control: Unable to Assess Appetite:  (UTA) Have you  had any weight changes? :  (UTA) Sleep: Unable to Assess Total Hours of Sleep:  (UTA) Vegetative Symptoms: None  ADLScreening [facility] Assessment Services) Patient's cognitive ability adequate to safely complete daily activities?: Yes Patient able to express need for assistance with ADLs?: Yes Independently performs ADLs?: Yes (appropriate for developmental age)  Prior Inpatient Therapy Prior Inpatient Therapy: Yes Prior Therapy Dates: 2015 Prior Therapy Facilty/Provider(s): [place] [facility] Reason for Treatment: unknown  Prior Outpatient Therapy Prior Outpatient Therapy:  Yes Prior Therapy Dates: 2015-2016 Prior [facility]  ([place] Reason for Treatment: Unknown  Does patient have an ACCT team?: No Does patient have Intensive In-House Services?  : No Does patient have [facility] services? : Unknown Does patient have P4CC services?: Unknown  ADL Screening (condition at time of admission) Patient's cognitive ability adequate to safely complete daily activities?: Yes Is the patient deaf or have difficulty hearing?: No Does the patient have difficulty seeing, even when wearing glasses/contacts?: No Does the patient have difficulty concentrating, remembering, or making decisions?: No Patient able to express need for assistance with ADLs?: Yes Does the patient have difficulty dressing or bathing?: No Independently performs ADLs?: Yes (appropriate for developmental age) Does the patient have difficulty walking or climbing stairs?: No Weakness of Legs: None Weakness of Arms/Hands: None  Home Assistive Devices/Equipment Home Assistive Devices/Equipment: None  Therapy Consults (therapy consults require a physician order) PT Evaluation Needed: No OT Evalulation Needed: No SLP Evaluation Needed: No Abuse/Neglect Assessment (Assessment to be complete while patient is alone) Abuse/Neglect Assessment Can Be Completed: Yes Physical Abuse: Denies Verbal Abuse: Denies Sexual Abuse: Denies Exploitation of patient/patient's resources: Denies Self-Neglect: Denies Values / Beliefs Cultural Requests During Hospitalization: None Spiritual Requests During Hospitalization: None Consults Spiritual Care Consult Needed: No Transition of Care Team Consult Needed: No            Disposition:  Disposition Initial Assessment Completed for this Encounter: Yes Patient referred to: Other (Comment)  On Site Evaluation by:   Reviewed with Physician:    Opal Sidles 08/20/2020 6:30 PM

## 2020-08-20 NOTE — ED Notes (Signed)
Pt ambulatory to [facility]; escorted via Herbert Seta, Scientific laboratory technician.

## 2020-08-20 NOTE — ED Notes (Signed)
Husband called asking to speak with patient. This Clinical research associate advised phone times ended at 7pm and start again at 9am that he could call back then. Husband again asked to speak with patient. Writer stated again phone time ended at 7pm. Patient's husband hung up.

## 2020-08-20 NOTE — ED Notes (Signed)
Pt given snack and drink at this time. 

## 2020-08-20 NOTE — ED Notes (Signed)
IVC, pend psych consult 

## 2020-08-20 NOTE — ED Notes (Signed)
Dr. Smith Robert and TTS evaluating Patient, she remains calm and cooperative.

## 2020-08-20 NOTE — ED Notes (Signed)

## 2020-08-21 MED ORDER — BENZTROPINE MESYLATE 1 MG PO TABS: 0.5000 mg | ORAL_TABLET | Freq: Two times a day (BID) | ORAL | 0 refills | Status: DC

## 2020-08-21 MED ORDER — OLANZAPINE 10 MG PO TABS: 10.0000 mg | ORAL_TABLET | Freq: Every day | ORAL | Status: DC

## 2020-08-21 MED ORDER — HALOPERIDOL 2 MG PO TABS: 2.0000 mg | ORAL_TABLET | Freq: Every day | ORAL | 0 refills | Status: DC

## 2020-08-21 MED ORDER — HALOPERIDOL 2 MG PO TABS: 2.0000 mg | ORAL_TABLET | Freq: Every day | ORAL | Status: DC

## 2020-08-21 MED ORDER — OLANZAPINE 10 MG PO TABS: 10.0000 mg | ORAL_TABLET | Freq: Every day | ORAL | 0 refills | Status: DC

## 2020-08-21 MED ORDER — DULOXETINE HCL 20 MG PO CPEP: 20.0000 mg | ORAL_CAPSULE | Freq: Every day | ORAL | 0 refills | Status: DC

## 2020-08-21 NOTE — ED Notes (Signed)
Pt discharged home. VS stable. Discharge instructions and prescriptions reviewed with patient. All belongings returned to pt. Pt denies SI.

## 2020-08-21 NOTE — Discharge Instructions (Signed)
Please take your medications as prescribed.  You are discharged with a 1 month prescription for your psychiatric medications.  Please follow-up with your providers as an outpatient, and we have also included information for [facility] behavioral health services here locally.  If you develop any acutely worsening symptoms or thoughts of hurting herself, please return to the ED.

## 2020-08-21 NOTE — ED Notes (Signed)
Pt given phone to call for a ride.  

## 2020-08-21 NOTE — BH Assessment (Signed)
TTS completed reassessment of pt per providers request. Pt presents alert, anxious but calm and oriented x 3. Pt was observed to be eating breakfast at the start of the reassessment. Pt states "I'm going home today If I can just get my meds I will be just fine". Pt denies endorsing any current withdrawal symptoms and agreed to follow up with OPT resources provided by TTS independently. Pt discussed her current medications with provider and reported her plan to be to return home to her husband. Pt denies any current SI/HI/AH/VH and contracted for safety.   Per Dr. Smith Robert pt's IVC will be rescinded and pt will be discharged with the recommendation to follow up with OPT resources provided.

## 2020-08-21 NOTE — ED Notes (Signed)
Pt dressing for discharge.  

## 2020-08-21 NOTE — ED Provider Notes (Signed)
Emergency Medicine Observation Re-evaluation Note  Katrina Turner is a 39 y.o. female, seen on rounds today.  Pt initially presented to the ED for complaints of IVC Currently, the patient is resting comfortably.  Physical Exam  BP 121/82 (BP Location: Left Arm)   Pulse 99   Temp 98.5 F (36.9 C) (Oral)   Resp 18   Ht 5\' 7"  (1.702 m)   Wt 68 kg   SpO2 100%   BMI 23.49 kg/m  Physical Exam Constitutional:      Appearance: She is not ill-appearing or toxic-appearing.  Eyes:     Extraocular Movements: Extraocular movements intact.     Pupils: Pupils are equal, round, and reactive to light.  Cardiovascular:     Rate and Rhythm: Normal rate.  Pulmonary:     Effort: Pulmonary effort is normal.  Abdominal:     General: There is no distension.  Skin:    General: Skin is warm and dry.  Neurological:     General: No focal deficit present.     Cranial Nerves: No cranial nerve deficit.      ED Course / MDM  EKG:    I have reviewed the labs performed to date as well as medications administered while in observation.  Recent changes in the last 24 hours include continued bed search.  Plan  Current plan is for inpatient psychiatric care. Patient is under full IVC at this time.   , MD 08/21/20 [phone]

## 2020-08-21 NOTE — Final Progress Note (Signed)
Physician Final Progress Note  Patient ID: Tia Hieronymus MRN: [id number] DOB/AGE: 1981-09-10 38 y.o.  Admit date: 08/20/2020 Admitting provider: No admitting provider for patient encounter. Discharge date: 08/21/2020   Admission Diagnoses:  Chronic schizophrenia   ETOH depenence and withdrawal  Polysubstance dependence and withdrawal   Discharge Diagnoses:  Active Problems:   * No active hospital problems. *  Same  Consults:   TTS  ER MD PSYCH MD   Significant Findings/ Diagnostic Studies:    Elevated ETOH  Procedures: CIWA   Discharge Condition: {fair   Disposition:  home at her request  Husband to pick up   Diet:  As tolerated   Discharge Activity: { as tolerated AA NA related gprogramming  Community mental health,  Church service groups  Celebrate recovery   Patient feels she can go home Her regular meds make her feel better She does not need a full detox she says   She is going home on   Zyprexa 10 qhs  Cogentin 0.5 bid Cymbalta 30 daily  And Haldol 2 qhs  She has no new medical problems or side effects  Husband will pick her up    Alert cooperative oriented times four Strange odd ---eccentric whizened older than stated age  Consciousness not clouded or fluctuant Mood improving affect improving No shakes tremors tics  No frank severe psychosis or mania for thought process and content No active SI HI or plans contracts for safety  Speech at baseline is garbled ---disarticulation and she has TD signs and symptoms   Memory no new change Fund of knowledge intelligence judgement insight reliability all fair to poor  Abstraction fair to poor Rapport fair Eye contact fair    Leans not known  Musculoskeletal no change Handedness not known Gait and station normal  Recall fair Cognition improving  Assets --will go to program with husband Language English  ADL's okay now  Psychomotor more normal     Total time spent taking care of this patient: 30  -40 minutes  Signed: Roselind Messier 08/21/2020, 1:35 PM

## 2020-08-21 NOTE — ED Notes (Signed)
Pt has visitor for 15 minutes. Visit monitored by RN. Pt angry his discharge papers were not ready by the end of her visit.

## 2020-09-07 ENCOUNTER — Other Ambulatory Visit: Payer: Self-pay

## 2020-09-07 ENCOUNTER — Ambulatory Visit: Payer: Self-pay | Admitting: *Deleted

## 2020-09-07 NOTE — Telephone Encounter (Signed)
Copied from CRM [id number]. Topic: General - Other >> Sep 07, 2020  2:09 PM Jaquita Rector A wrote: Reason for CRM: Patient called to ask Glendell Docker if she can please call in some ondansetron [facility]) injection 4 mg  for her to the pharmacy [facility] - [place], [place] - [address]   Phone:  [phone] Fax:  [phone]

## 2020-09-07 NOTE — Telephone Encounter (Signed)
Will not refill medications as none are on her current medications list.   Her current list shows she should be taking zyprexa (olanzapine), Haloperidol (haldol), duloxetine (cymbalta), and Benzotropine (cogentin).  These were all given for her at her last ER visit.

## 2020-09-07 NOTE — Telephone Encounter (Signed)
°  Patient requesting medications to be called in to pharmacy. 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 Patient very pleasant, manic type conversation. Multiple requests made and most important request was for refill or renewal of medications. Patient requesting ultram for pain, ativan for anxiety, effexor, and abilify. Patient reports appt with psychology not until next week. Attempted to make appt and patient requested to call her if appt needed but she will see psy. Next week.  Care advise given. Patient verbalized understanding of care advise and to call back if symptoms worsen. Please advise.    Reason for Disposition  Prescription request for new medicine (not a refill)  Answer Assessment - Initial Assessment Questions 1. DRUG NAME: "What medicine do you need to have refilled?"     Ativan, effexor, abilify, ultram, lyrica  2. REFILLS REMAINING: "How many refills are remaining?" (Note: The label on the medicine or pill bottle will show how many refills are remaining. If there are no refills remaining, then a renewal may be needed.)     unknown 3. EXPIRATION DATE: "What is the expiration date?" (Note: The label states when the prescription will expire, and thus can no longer be refilled.)     Unknown patient not at home to see bottles  4. PRESCRIBING HCP: "Who prescribed it?" Reason: If prescribed by specialist, call should be referred to that group.     Not sure  5. 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 PREGNANCY: "Is there any chance that you are pregnant?" "When was your last menstrual period?"     na  Protocols used: MEDICATION REFILL AND RENEWAL CALL-A-AH

## 2020-09-07 NOTE — Telephone Encounter (Signed)
See Nurse Triage note at 4:18 PM for further assessment and disposition.

## 2020-09-07 NOTE — Telephone Encounter (Signed)
Copied from CRM [id number]. Topic: General - Inquiry >> Sep 07, 2020  3:58 PM Daphine Deutscher D wrote: Reason for CRM: Pt called asking when her prescriptions were going to be sent to the pharmacy that she had requested today.   I told her about the Zofran but she said she needed something to help her sleep and that she had not slept in 3 days.  Pt became upset and started crying.  I called [facility] and spoke with Vernona Rieger who was aware of the of the rx for Zofran but no other rx's.   Vernona Rieger suggested to transfer her to NT but the NT line was a hold over two minutes.  The patient was very calm when I got back to the phone.  Somehow we got disconnected after that and I tried to call the patient back but the number listed said it could not take a call right now.

## 2020-09-10 NOTE — Telephone Encounter (Signed)
Agree. Patient has also been referred to psychiatry for these medications, Please find out if she has already been seen since she was previously referred or if she needs another psychiatric referral ok to place order as urgent and she should hear within 1 week. Seek emergency care immediately if any emergent symptoms. Resources below as well as two locations that take walk in's.  Psychiatric/Counseling Resources Discussed As Follows:  If Emergency please seek Emergency Room Care Immediately or Call 911.   [facility] Address:  [address] [place], [place] [address] Phone: [phone] Website : [email or web address]   [facility] Address:  [address] [place], [place] [address] Phone: [phone] Fax: [phone] Website: [email or web address] How To Access Our Services Because our main goal is to meet the needs of our consumers, [facility] operates on a walk-in basis! To access services, there are just 3 easy steps: 1) Walk in any Monday, Wednesday or Friday between 8:00 am and 3:00 pm and complete our consumer paperwork 2) A Comprehensive Clinical Assessment (CCA) will be completed and appropriate service recommendations will be provided 3) Recommendations are sent to [facility] team members and the appropriate staff will call you within days. Advanced Access Open M - F, 8:00 am - 8:00 pm  Mental health crisis services for all age groups  Triage  Psychiatric Evaluations  Involuntary Commitments  [facility]  Address: [address] [place], [place] [address] Website : [email or web address] Walk in's accepted see web site or call for more information Phone : [phone] Also has [facility] [phone]    Psych[facility] Today Find a therapist by searching online in your area or specialist by your diagnosis Website:  [email or web address]

## 2020-09-11 NOTE — Telephone Encounter (Signed)
lmtcb okay for [facility] triage nurse to advise patient of message below if she returns office call back. KW

## 2020-09-12 NOTE — Telephone Encounter (Signed)
No answer. Mailbox full-unable to leave a message.

## 2020-10-02 NOTE — Telephone Encounter (Signed)
Patient advised.KW 

## 2020-10-16 ENCOUNTER — Emergency Department
Admission: EM | Admit: 2020-10-16 | Discharge: 2020-10-16 | Discharge status: Against Medical Advice | Payer: 59 | Attending: Emergency Medicine | Admitting: Emergency Medicine

## 2020-10-16 ENCOUNTER — Emergency Department: Payer: 59

## 2020-10-16 ENCOUNTER — Inpatient Hospital Stay
Admission: EM | Admit: 2020-10-16 | Discharge: 2020-10-17 | DRG: 392 | Discharge status: Against Medical Advice | Payer: 59 | Attending: Internal Medicine | Admitting: Internal Medicine

## 2020-10-16 ENCOUNTER — Other Ambulatory Visit: Payer: Self-pay

## 2020-10-16 DIAGNOSIS — K219 Gastro-esophageal reflux disease without esophagitis: Secondary | ICD-10-CM | POA: Diagnosis present

## 2020-10-16 DIAGNOSIS — K921 Melena: Secondary | ICD-10-CM

## 2020-10-16 DIAGNOSIS — D500 Iron deficiency anemia secondary to blood loss (chronic): Secondary | ICD-10-CM | POA: Diagnosis not present

## 2020-10-16 DIAGNOSIS — K860 Alcohol-induced chronic pancreatitis: Secondary | ICD-10-CM

## 2020-10-16 DIAGNOSIS — F10120 Alcohol abuse with intoxication, uncomplicated: Secondary | ICD-10-CM | POA: Diagnosis present

## 2020-10-16 DIAGNOSIS — G43109 Migraine with aura, not intractable, without status migrainosus: Secondary | ICD-10-CM | POA: Diagnosis present

## 2020-10-16 DIAGNOSIS — F419 Anxiety disorder, unspecified: Secondary | ICD-10-CM | POA: Diagnosis present

## 2020-10-16 DIAGNOSIS — Z888 Allergy status to other drugs, medicaments and biological substances status: Secondary | ICD-10-CM

## 2020-10-16 DIAGNOSIS — Z79899 Other long term (current) drug therapy: Secondary | ICD-10-CM

## 2020-10-16 DIAGNOSIS — R1011 Right upper quadrant pain: Secondary | ICD-10-CM

## 2020-10-16 DIAGNOSIS — Z9884 Bariatric surgery status: Secondary | ICD-10-CM

## 2020-10-16 DIAGNOSIS — K529 Noninfective gastroenteritis and colitis, unspecified: Secondary | ICD-10-CM | POA: Diagnosis not present

## 2020-10-16 DIAGNOSIS — F10920 Alcohol use, unspecified with intoxication, uncomplicated: Secondary | ICD-10-CM

## 2020-10-16 DIAGNOSIS — D6959 Other secondary thrombocytopenia: Secondary | ICD-10-CM | POA: Diagnosis present

## 2020-10-16 DIAGNOSIS — Z90711 Acquired absence of uterus with remaining cervical stump: Secondary | ICD-10-CM

## 2020-10-16 DIAGNOSIS — F319 Bipolar disorder, unspecified: Secondary | ICD-10-CM | POA: Diagnosis not present

## 2020-10-16 DIAGNOSIS — Z91041 Radiographic dye allergy status: Secondary | ICD-10-CM

## 2020-10-16 DIAGNOSIS — Z8249 Family history of ischemic heart disease and other diseases of the circulatory system: Secondary | ICD-10-CM

## 2020-10-16 DIAGNOSIS — Z20822 Contact with and (suspected) exposure to covid-19: Secondary | ICD-10-CM | POA: Diagnosis present

## 2020-10-16 DIAGNOSIS — I10 Essential (primary) hypertension: Secondary | ICD-10-CM | POA: Diagnosis present

## 2020-10-16 DIAGNOSIS — F17210 Nicotine dependence, cigarettes, uncomplicated: Secondary | ICD-10-CM | POA: Diagnosis present

## 2020-10-16 DIAGNOSIS — F1010 Alcohol abuse, uncomplicated: Secondary | ICD-10-CM | POA: Diagnosis not present

## 2020-10-16 DIAGNOSIS — R780 Finding of alcohol in blood: Secondary | ICD-10-CM | POA: Diagnosis present

## 2020-10-16 DIAGNOSIS — Z91048 Other nonmedicinal substance allergy status: Secondary | ICD-10-CM

## 2020-10-16 DIAGNOSIS — D62 Acute posthemorrhagic anemia: Secondary | ICD-10-CM | POA: Diagnosis present

## 2020-10-16 DIAGNOSIS — Z91030 Bee allergy status: Secondary | ICD-10-CM

## 2020-10-16 DIAGNOSIS — K922 Gastrointestinal hemorrhage, unspecified: Secondary | ICD-10-CM | POA: Diagnosis present

## 2020-10-16 DIAGNOSIS — K861 Other chronic pancreatitis: Secondary | ICD-10-CM | POA: Diagnosis present

## 2020-10-16 DIAGNOSIS — Z972 Presence of dental prosthetic device (complete) (partial): Secondary | ICD-10-CM

## 2020-10-16 DIAGNOSIS — K08109 Complete loss of teeth, unspecified cause, unspecified class: Secondary | ICD-10-CM

## 2020-10-16 DIAGNOSIS — F339 Major depressive disorder, recurrent, unspecified: Secondary | ICD-10-CM

## 2020-10-16 DIAGNOSIS — Z9049 Acquired absence of other specified parts of digestive tract: Secondary | ICD-10-CM

## 2020-10-16 HISTORY — DX: Gastro-esophageal reflux disease without esophagitis: K21.9

## 2020-10-16 LAB — COMPREHENSIVE METABOLIC PANEL
ALT: 27 U/L (ref 0–44)
AST: 86 U/L — ABNORMAL HIGH (ref 15–41)
Albumin: 2.6 g/dL — ABNORMAL LOW (ref 3.5–5.0)
Alkaline Phosphatase: 199 U/L — ABNORMAL HIGH (ref 38–126)
Anion gap: 11 (ref 5–15)
BUN: 12 mg/dL (ref 6–20)
CO2: 24 mmol/L (ref 22–32)
Calcium: 7.7 mg/dL — ABNORMAL LOW (ref 8.9–10.3)
Chloride: 103 mmol/L (ref 98–111)
Creatinine, Ser: 0.47 mg/dL (ref 0.44–1.00)
GFR, Estimated: >60 mL/min (ref >60)
Glucose, Bld: 103 mg/dL — ABNORMAL HIGH (ref 70–99)
Potassium: 4.3 mmol/L (ref 3.5–5.1)
Sodium: 138 mmol/L (ref 135–145)
Total Bilirubin: 1 mg/dL (ref 0.3–1.2)
Total Protein: 6.4 g/dL — ABNORMAL LOW (ref 6.5–8.1)

## 2020-10-16 LAB — LACTIC ACID, PLASMA: Lactic Acid, Venous: 3.3 mmol/L (ref 0.5–1.9)

## 2020-10-16 LAB — CBC
HCT: 26.2 % — ABNORMAL LOW (ref 36.0–46.0)
Hemoglobin: 8.6 g/dL — ABNORMAL LOW (ref 12.0–15.0)
MCH: 31.4 pg (ref 26.0–34.0)
MCHC: 32.8 g/dL (ref 30.0–36.0)
MCV: 95.6 fL (ref 80.0–100.0)
Platelets: 122 10*3/uL — ABNORMAL LOW (ref 150–400)
RBC: 2.74 MIL/uL — ABNORMAL LOW (ref 3.87–5.11)
RDW: 20.6 % — ABNORMAL HIGH (ref 11.5–15.5)
WBC: 6.1 10*3/uL (ref 4.0–10.5)
nRBC: 0 % (ref 0.0–0.2)

## 2020-10-16 LAB — PROTIME-INR
INR: 1.1 (ref 0.8–1.2)
Prothrombin Time: 13.7 seconds (ref 11.4–15.2)

## 2020-10-16 LAB — RESPIRATORY PANEL BY RT PCR (FLU A&B, COVID)
Influenza A by PCR: NEGATIVE
Influenza B by PCR: NEGATIVE
SARS Coronavirus 2 by RT PCR: NEGATIVE

## 2020-10-16 LAB — ETHANOL: Alcohol, Ethyl (B): 356 mg/dL (ref <10)

## 2020-10-16 MED ORDER — ADULT MULTIVITAMIN W/MINERALS CH
1.0000 | ORAL_TABLET | Freq: Every day | ORAL | Status: DC
  Administered 2020-10-16: 1 via ORAL
  Filled 2020-10-16: qty 1

## 2020-10-16 MED ORDER — SODIUM CHLORIDE 0.9 % IV SOLN: 2.0000 g | Freq: Once | INTRAVENOUS | Status: DC

## 2020-10-16 MED ORDER — HALOPERIDOL 2 MG PO TABS
2.0000 mg | ORAL_TABLET | Freq: Every day | ORAL | Status: DC
  Administered 2020-10-16: 2 mg via ORAL
  Filled 2020-10-16 (×2): qty 1

## 2020-10-16 MED ORDER — SODIUM CHLORIDE 0.9 % IV BOLUS
1000.0000 mL | Freq: Once | INTRAVENOUS | Status: AC
  Administered 2020-10-16: 1000 mL via INTRAVENOUS

## 2020-10-16 MED ORDER — NORTRIPTYLINE HCL 10 MG PO CAPS: 10.0000 mg | ORAL_CAPSULE | Freq: Two times a day (BID) | ORAL | Status: DC

## 2020-10-16 MED ORDER — ONDANSETRON HCL 4 MG/2ML IJ SOLN: 4.0000 mg | Freq: Once | INTRAMUSCULAR | Status: DC

## 2020-10-16 MED ORDER — ALBUTEROL SULFATE HFA 108 (90 BASE) MCG/ACT IN AERS
1.0000 | INHALATION_SPRAY | Freq: Four times a day (QID) | RESPIRATORY_TRACT | Status: DC | PRN
  Filled 2020-10-16: qty 6.7

## 2020-10-16 MED ORDER — SODIUM CHLORIDE 0.9 % IV SOLN
2.0000 g | Freq: Once | INTRAVENOUS | Status: AC
  Administered 2020-10-16: 2 g via INTRAVENOUS
  Filled 2020-10-16: qty 20

## 2020-10-16 MED ORDER — SODIUM CHLORIDE 0.9 % IV SOLN
80.0000 mg | Freq: Once | INTRAVENOUS | Status: AC
  Administered 2020-10-16: 20:00:00 80 mg via INTRAVENOUS
  Filled 2020-10-16: qty 80

## 2020-10-16 MED ORDER — FOLIC ACID 1 MG PO TABS
1.0000 mg | ORAL_TABLET | Freq: Every day | ORAL | Status: DC
  Administered 2020-10-16: 1 mg via ORAL
  Filled 2020-10-16: qty 1

## 2020-10-16 MED ORDER — OCTREOTIDE LOAD VIA INFUSION
50.0000 ug | Freq: Once | INTRAVENOUS | Status: AC
  Administered 2020-10-16: 50 ug via INTRAVENOUS
  Filled 2020-10-16: qty 25

## 2020-10-16 MED ORDER — METRONIDAZOLE IN NACL 5-0.79 MG/ML-% IV SOLN
500.0000 mg | Freq: Once | INTRAVENOUS | Status: AC
  Administered 2020-10-17: 500 mg via INTRAVENOUS

## 2020-10-16 MED ORDER — LORAZEPAM 1 MG PO TABS: 1.0000 mg | ORAL_TABLET | ORAL | Status: DC | PRN

## 2020-10-16 MED ORDER — THIAMINE HCL 100 MG PO TABS
100.0000 mg | ORAL_TABLET | Freq: Every day | ORAL | Status: DC
  Filled 2020-10-16: qty 1

## 2020-10-16 MED ORDER — LORAZEPAM 2 MG/ML IJ SOLN
1.0000 mg | Freq: Once | INTRAMUSCULAR | Status: AC
  Administered 2020-10-16: 1 mg via INTRAVENOUS
  Filled 2020-10-16: qty 1

## 2020-10-16 MED ORDER — LORAZEPAM 2 MG/ML IJ SOLN
1.0000 mg | INTRAMUSCULAR | Status: DC | PRN
  Administered 2020-10-17: 1 mg via INTRAVENOUS
  Administered 2020-10-17: 2 mg via INTRAVENOUS
  Administered 2020-10-17: 1 mg via INTRAVENOUS
  Filled 2020-10-16 (×3): qty 1

## 2020-10-16 MED ORDER — OLANZAPINE 5 MG PO TABS
10.0000 mg | ORAL_TABLET | Freq: Every day | ORAL | Status: DC
  Administered 2020-10-16: 10 mg via ORAL
  Filled 2020-10-16: qty 1

## 2020-10-16 MED ORDER — DULOXETINE HCL 20 MG PO CPEP
20.0000 mg | ORAL_CAPSULE | Freq: Every day | ORAL | Status: DC
  Filled 2020-10-16: qty 1

## 2020-10-16 MED ORDER — BENZTROPINE MESYLATE 0.5 MG PO TABS
0.5000 mg | ORAL_TABLET | Freq: Two times a day (BID) | ORAL | Status: DC
  Administered 2020-10-16: 0.5 mg via ORAL
  Filled 2020-10-16 (×3): qty 1

## 2020-10-16 MED ORDER — METRONIDAZOLE IN NACL 5-0.79 MG/ML-% IV SOLN
500.0000 mg | Freq: Three times a day (TID) | INTRAVENOUS | Status: DC
  Administered 2020-10-17 (×2): 500 mg via INTRAVENOUS
  Filled 2020-10-16 (×3): qty 100

## 2020-10-16 MED ORDER — SODIUM CHLORIDE 0.9 % IV SOLN
50.0000 ug/h | INTRAVENOUS | Status: DC
  Administered 2020-10-16 – 2020-10-17 (×2): 50 ug/h via INTRAVENOUS
  Filled 2020-10-16 (×9): qty 1

## 2020-10-16 MED ORDER — THIAMINE HCL 100 MG/ML IJ SOLN: 100.0000 mg | Freq: Every day | INTRAMUSCULAR | Status: DC

## 2020-10-16 MED ORDER — THIAMINE HCL 100 MG/ML IJ SOLN
100.0000 mg | Freq: Once | INTRAMUSCULAR | Status: AC
  Administered 2020-10-16: 100 mg via INTRAVENOUS
  Filled 2020-10-16: qty 2

## 2020-10-16 MED ORDER — SODIUM CHLORIDE 0.9 % IV SOLN: INTRAVENOUS | Status: DC

## 2020-10-16 MED ORDER — SODIUM CHLORIDE 0.9 % IV SOLN
8.0000 mg/h | INTRAVENOUS | Status: DC
  Administered 2020-10-16 – 2020-10-17 (×2): 8 mg/h via INTRAVENOUS
  Filled 2020-10-16 (×3): qty 80

## 2020-10-16 NOTE — ED Provider Notes (Signed)
[facility] Emergency Department Provider Note  ____________________________________________   First MD Initiated Contact with Patient 10/16/20 1822     (approximate)  I have reviewed the triage vital signs and the nursing notes.   HISTORY  Chief Complaint No chief complaint on file.    HPI Katrina Turner is a 39 y.o. female with history of bipolar disorder, previous bypass, here with abdominal pain, nausea, vomiting, blood in stools.  The patient is somewhat intoxicated on arrival, limiting history.  However, she reports that for the last week, she has had aching, gnawing, epigastric pain with vomiting.  She has had occasional coffee-ground emesis with this.  She has also noticed some melena and now grossly bloody stools.  She has associated diffuse cramping, severe, abdominal pain.  She has associated chills but no fevers.  No diarrhea.  Denies known history of bleeds.  She does note that she takes ibuprofen fairly regularly as well.  Denies any history of cirrhosis.  No specific alleviating orr aggravating factors.        Past Medical History:  Diagnosis Date  . Allergy   . Bipolar 1 disorder (HCC)   . Depression   . Neuromuscular disorder (HCC)   . Pancreatitis 11/28/2019   approximated   . PTSD (post-traumatic stress disorder)   . Reynolds syndrome [facility])     Patient Active Problem List   Diagnosis Date Noted  . GI bleed 10/16/2020  . Essential hypertension 10/16/2020  . Low hemoglobin 07/11/2020  . 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 Hot flashes 06/05/2020  . Full dentures 06/05/2020  . Chronic pancreatitis (HCC) 06/05/2020  . Chronic migraine without aura without status migrainosus, not intractable 06/05/2020  . Seasonal allergies 06/05/2020  . High risk medication use 06/05/2020  . Personality disorder (HCC) 06/05/2020  . Urinary symptom or sign 06/05/2020  . Skin lesion 06/05/2020  . Alcohol abuse 06/27/2019  . Bipolar  1 disorder (HCC) 05/24/2019  . H/O gastric bypass 05/24/2019  . Acute pancreatitis 05/24/2019  . Alcohol use 05/24/2019  . Tobacco use disorder 03/04/2019  . Abdominal pain 03/03/2019  . Lupus (HCC) 03/03/2019  . Raynaud's phenomenon without gangrene 03/03/2019    Past Surgical History:  Procedure Laterality Date  . APPENDECTOMY    . CESAREAN SECTION    . HERNIA REPAIR    . PARTIAL HYSTERECTOMY     ovaries present not uterus- precancerous cells of uterus   . ROUX-EN-Y GASTRIC BYPASS      Prior to Admission medications   Medication Sig Start Date End Date Taking? 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 07/31/20  Yes Malva Limes, MD  benztropine (COGENTIN) 1 MG tablet Take 0.5 tablets (0.5 mg total) by mouth 2 (two) times daily. 08/21/20 09/20/20  Delton Prairie, MD  cetirizine (ZYRTEC ALLERGY) 10 MG tablet Take 1 tablet (10 mg total) by mouth daily. Patient not taking: Reported on 10/16/2020 06/05/20   Flinchum, Eula Fried, FNP  DULoxetine (CYMBALTA) 20 MG capsule Take 1 capsule (20 mg total) by mouth daily. 08/21/20 09/20/20  Delton Prairie, MD  fluticasone (FLONASE) 50 MCG/ACT nasal spray Place 1 spray into both nostrils daily. Patient not taking: Reported on 10/16/2020 06/05/20   Flinchum, Eula Fried, FNP  haloperidol (HALDOL) 2 MG tablet Take 1 tablet (2 mg total) by  mouth at bedtime. 08/21/20 09/20/20  Delton Prairie, MD  nortriptyline (PAMELOR) 10 MG capsule Take 10 mg by mouth 2 (two) times daily. Patient not taking: Reported on 10/16/2020 07/19/20   [provider]  OLANZapine (ZYPREXA) 10 MG tablet Take 1 tablet (10 mg total) by mouth at bedtime. 08/21/20 09/20/20  Delton Prairie, MD    Allergies Other, Bee venom, Depakote er Mliss Sax sodium er], Ivp dye [iodinated diagnostic agents], Nsaids, and  Tegretol [carbamazepine]  Family History  Problem Relation Age of Onset  . Diabetes Father   . Heart disease Father   . Autism Son   . Pancreatic cancer Maternal Grandmother   . Heart disease Maternal Grandfather   . Seizures Paternal Grandmother   . Prostate cancer Paternal Grandmother   . Heart disease Paternal Grandfather     Social History Social History   Tobacco Use  . Smoking status: Current Every Day Smoker    Packs/day: 1.00    Types: Cigarettes  . Smokeless tobacco: Never Used  Vaping Use  . Vaping Use: Never used  Substance Use Topics  . Alcohol use: Yes    Alcohol/week: 35.0 standard drinks    Types: 35 Cans of beer per week    Comment: pt states a qt. a day  . Drug use: Never    Review of Systems  Review of Systems  Constitutional: Positive for chills and fatigue. Negative for fever.  HENT: Negative for congestion and sore throat.   Eyes: Negative for visual disturbance.  Respiratory: Negative for cough and shortness of breath.   Cardiovascular: Negative for chest pain.  Gastrointestinal: Positive for nausea and vomiting. Negative for abdominal pain and diarrhea.  Genitourinary: Negative for flank pain.  Musculoskeletal: Negative for back pain and neck pain.  Skin: Negative for rash and wound.  Neurological: Positive for weakness.  All other systems reviewed and are negative.    ____________________________________________  PHYSICAL EXAM:      VITAL SIGNS: ED Triage Vitals  Enc Vitals Group     BP 10/16/20 1731 109/76     Pulse Rate 10/16/20 1731 (!) 147     Resp 10/16/20 1731 16     Temp 10/16/20 1731 99.5 F (37.5 C)     Temp Source 10/16/20 1731 Oral     SpO2 10/16/20 1731 100 %     Weight 10/16/20 1731 140 lb (63.5 kg)     Height 10/16/20 1731  (1.702 m)     Head Circumference --      Peak Flow --      Pain Score 10/16/20 1743 10     Pain Loc --      Pain Edu? --      Excl. in GC? --      Physical Exam Vitals and nursing  note reviewed.  Constitutional:      General: She is not in acute distress.    Appearance: She is well-developed.  HENT:     Head: Normocephalic and atraumatic.  Eyes:     Conjunctiva/sclera: Conjunctivae normal.  Cardiovascular:     Rate and Rhythm: Regular rhythm. Tachycardia present.     Heart sounds: Normal heart sounds. No murmur heard.  No friction rub.  Pulmonary:     Effort: Pulmonary effort is normal. No respiratory distress.     Breath sounds: Normal breath sounds. No wheezing or rales.  Abdominal:     General: Abdomen is flat. Bowel sounds are decreased. There is no distension.  Palpations: Abdomen is soft.     Tenderness: There is generalized abdominal tenderness. There is no guarding or rebound. Negative signs include Murphy's sign and Rovsing's sign.  Musculoskeletal:     Cervical back: Neck supple.  Skin:    General: Skin is warm.     Capillary Refill: Capillary refill takes less than 2 seconds.  Neurological:     Mental Status: She is alert and oriented to person, place, and time.     Motor: No abnormal muscle tone.       ____________________________________________   LABS (all labs ordered are listed, but only abnormal results are displayed)  Labs Reviewed  COMPREHENSIVE METABOLIC PANEL - Abnormal; Notable for the following components:      Result Value   Glucose, Bld 103 (*)    Calcium 7.7 (*)    Total Protein 6.4 (*)    Albumin 2.6 (*)    AST 86 (*)    Alkaline Phosphatase 199 (*)    All other components within normal limits  CBC - Abnormal; Notable for the following components:   RBC 2.74 (*)    Hemoglobin 8.6 (*)    HCT 26.2 (*)    RDW 20.6 (*)    Platelets 122 (*)    All other components within normal limits  ETHANOL - Abnormal; Notable for the following components:   Alcohol, Ethyl (B) 356 (*)    All other components within normal limits  LACTIC ACID, PLASMA - Abnormal; Notable for the following components:   Lactic Acid, Venous 3.3  (*)    All other components within normal limits  RESPIRATORY PANEL BY RT PCR (FLU A&B, COVID)  GASTROINTESTINAL PANEL BY PCR, STOOL (REPLACES STOOL CULTURE)  C DIFFICILE QUICK SCREEN W PCR REFLEX  PROTIME-INR  LACTIC ACID, PLASMA  MAGNESIUM  PHOSPHORUS  CBC  HIV ANTIBODY (ROUTINE TESTING W REFLEX)  COMPREHENSIVE METABOLIC PANEL  TSH  CBC  CBC  CBC  CBC  CBC  CBC  CBC  POC OCCULT BLOOD, ED  TYPE AND SCREEN    ____________________________________________  EKG: Sinus tachycardia, ventricular rate 148.  PR 126, QRS 78, QTc 533.  Prolonged QT.  PVCs noted.  No acute ST elevations or depressions. ________________________________________  RADIOLOGY All imaging, including plain films, CT scans, and ultrasounds, independently reviewed by me, and interpretations confirmed via formal radiology reads.  ED MD interpretation:   CT abdomen/pelvis: Thickened and edematous colon, concerning for infectious or inflammatory colitis, punctate lucency at the neck of the gallbladder, but no significant signs of cholecystitis, prior mesh repair  Official radiology report(s): CT ABDOMEN PELVIS WO CONTRAST  Result Date: 10/16/2020 CLINICAL DATA:  Nausea, vomiting, black tarry stools for 1 day with weakness EXAM: CT ABDOMEN AND PELVIS WITHOUT CONTRAST TECHNIQUE: Multidetector CT imaging of the abdomen and pelvis was performed following the standard protocol without IV contrast. COMPARISON:  CT 05/24/2019 FINDINGS: Lower chest: Lung bases are clear. Normal heart size. No pericardial effusion. Hepatobiliary: Diffuse hepatic hypoattenuation compatible with hepatic steatosis. Sparing along the gallbladder fossa. Question some mild gallbladder wall thickening. No visible calcified gallstone is seen. There is a punctate lucency towards the neck of the gallbladder (2/31) of indeterminate etiology as no additional foci of pneumobilia are identified. No biliary ductal dilatation. Pancreas: No pancreatic ductal  dilatation or surrounding inflammatory changes. Spleen: Normal in size. No concerning splenic lesions. Adrenals/Urinary Tract: Normal adrenal glands. Kidneys are normally located and symmetric in size. No visible concerning renal lesion. No urolithiasis or hydronephrosis. Some  mild bladder wall thickening may be related to underdistention. Stomach/Bowel: Postsurgical changes from prior Roux-en-Y antecolic gastric bypass. Intramural fat of the excluded gastric segment is nonspecific and can reflect sequela of prior inflammatory change or a lipomatous body habitus. No small bowel dilatation or convincing evidence of obstruction. The appendix is surgically absent. Thickened and edematous appearance of the colon including the hepatic flexure which is interposed anterior to the liver. Some more mild distal colonic thickening may be present as well though is underdistended and less well evaluated. Much of the small and large bowel appears closely apposed to the anterior abdominal wall where a mesh hernia repair is noted. No resulting evidence of obstruction is seen. Vascular/Lymphatic: Atherosclerotic calcifications within the abdominal aorta and branch vessels. No aneurysm or ectasia. No enlarged abdominopelvic lymph nodes. Reproductive: Uterus is surgically absent. No concerning adnexal lesions. Other: Inflammatory changes in the right pericolic gutter adjacent the colon configuration of the mesenteric fat compatible with prior gastric bypass. No abdominopelvic free air or fluid. Prior anterior mesh hernia repair. No recurrent hernia or bowel containing hernia at this time. Musculoskeletal: Multilevel degenerative changes are present in the imaged portions of the spine. No acute osseous abnormality or suspicious osseous lesion. IMPRESSION: 1. Thickened and edematous appearance of the ascending colon and hepatic flexure which is interposed anterior to the liver. Mucosal hyperemia may be reactive and incompletely  evaluated given underdistention a source of bleeding in this location is difficult to fully exclude particularly given a noncontrast technique. Some more mild distal colonic thickening may be present as well though is underdistended and less well evaluated. Findings are concerning for an infectious or inflammatory colitis. 2. Punctate lucency towards the neck of the gallbladder of indeterminate etiology as no additional foci of pneumobilia are identified. Regardless, there is some mild gallbladder wall thickening and faint pericholecystic haze as well. Consider right upper quadrant ultrasound for further assessment. 3. Postsurgical changes from prior Roux-en-Y antecolic gastric bypass. Intramural fat of the excluded gastric segment is nonspecific and can reflect sequela of prior inflammatory change or a lipomatous body habitus. 4. Prior anterior mesh hernia repair. No recurrent hernia or bowel containing hernia at this time. Cervical closely adherent loops of bowel without resulting obstruction from adhesion. 5. Hepatic steatosis. 6. Mild bladder wall thickening may be related to underdistention. Correlate for urinary symptoms. 7. Aortic Atherosclerosis (ICD10-I70.0). Electronically Signed   By: Kreg Shropshire M.D.   On: 10/16/2020 19:24   US Abdomen Limited RUQ (LIVER/GB)  Result Date: 10/16/2020 CLINICAL DATA:  Upper abdominal pain EXAM: ULTRASOUND ABDOMEN LIMITED RIGHT UPPER QUADRANT COMPARISON:  None. FINDINGS: Gallbladder: No gallstones or wall thickening visualized. There is no pericholecystic fluid. No sonographic Murphy sign noted by sonographer. Common bile duct: Diameter: 4 mm. No intrahepatic or extrahepatic biliary duct dilatation Liver: No focal lesion identified. Liver echogenicity is increased diffusely. Portal vein is patent on color Doppler imaging with normal direction of blood flow towards the liver. Other: None. IMPRESSION: 1. Diffuse increase in liver echogenicity, a finding indicative of  hepatic steatosis, potentially with underlying parenchymal liver disease. No focal liver lesions are evident. It must be cautioned that the sensitivity of ultrasound for detection of focal liver lesions is somewhat diminished in this circumstance. 2.  Study otherwise unremarkable. Electronically Signed   By: Bretta Bang III M.D.   On: 10/16/2020 21:18    ____________________________________________  PROCEDURES   Procedure(s) performed (including Critical Care):  .Critical Care Performed by: Shaune Pollack, MD Authorized by: Erma Heritage,  Sheria Lang, MD   Critical care provider statement:    Critical care time (minutes):  35   Critical care time was exclusive of:  Separately billable procedures and treating other patients and teaching time   Critical care was necessary to treat or prevent imminent or life-threatening deterioration of the following conditions:  Cardiac failure, circulatory failure and respiratory failure   Critical care was time spent personally by me on the following activities:  Development of treatment plan with patient or surrogate, discussions with consultants, evaluation of patient's response to treatment, examination of patient, obtaining history from patient or surrogate, ordering and performing treatments and interventions, ordering and review of laboratory studies, ordering and review of radiographic studies, pulse oximetry, re-evaluation of patient's condition and review of old charts   I assumed direction of critical care for this patient from another provider in my specialty: no      ____________________________________________  INITIAL IMPRESSION / MDM / ASSESSMENT AND PLAN / ED COURSE  As part of my medical decision making, I reviewed the following data within the electronic MEDICAL RECORD NUMBER Nursing notes reviewed and incorporated, Old chart reviewed, Notes from prior ED visits, and [place] Controlled Substance Database       *Delyla Sandeen was evaluated in  Emergency Department on 10/17/2020 for the symptoms described in the history of present illness. She was evaluated in the context of the global COVID-19 pandemic, which necessitated consideration that the patient might be at risk for infection with the SARS-CoV-2 virus that causes COVID-19. Institutional protocols and algorithms that pertain to the evaluation of patients at risk for COVID-19 are in a state of rapid change based on information released by regulatory bodies including the CDC and federal and state organizations. These policies and algorithms were followed during the patient's care in the ED.  Some ED evaluations and interventions may be delayed as a result of limited staffing during the pandemic.*   Medical Decision Making:  39 yo F here with nausea, vomiting, melena, and coffee-ground emesis. On arrival, pt notably tachycardic, dehydrated, and intoxicated. Suspect acute GI bleed, likely UGIB given her alcohol and NSAID use with h/o bypass, but must also consider LGIB given the maroon-colored stool. Lab work remarkable for Hgb 8.6, which is below her baseline of 10-11. Given her h/o bypass and significant TTP on exam, CT obtained and reviewed as above, which shows acute colitis. Query of Gb wall thickening noted but she has no focal TTP in this area, and bili/LFTs largely unremarkable. EtOH 356.   Suspect acute GIB, possibly related to colitis though UGIB from NSAID use, EtOH use, and bypass is also a high concern. Discussed case with Dr. Maximino Greenland of GI - will start on protonix gtt, also octreotide given her h/o EtOH abuse. Given findings of colitis on CT, will cover with Rocephin/Flagyl - this should appropriately cover in event of cirrhosis/variceal bleed as well. Will admit to medicine for trending CBC, sx control, and further management.    ____________________________________________  FINAL CLINICAL IMPRESSION(S) / ED DIAGNOSES  Final diagnoses:  RUQ pain  Lower GI bleed  Acute  colitis  Blood loss anemia  Alcoholic intoxication without complication (HCC)     MEDICATIONS GIVEN DURING THIS VISIT:  Medications  pantoprazole (PROTONIX) 80 mg in sodium chloride 0.9 % 100 mL (0.8 mg/mL) infusion (8 mg/hr Intravenous New Bag/Given 10/16/20 2024)  sodium chloride 0.9 % bolus 1,000 mL (0 mLs Intravenous Stopped 10/16/20 2023)    And  0.9 %  sodium chloride  infusion (has no administration in time range)  octreotide (SANDOSTATIN) 2 mcg/mL load via infusion 50 mcg (50 mcg Intravenous Bolus from Bag 10/16/20 2030)    And  octreotide (SANDOSTATIN) 500 mcg in sodium chloride 0.9 % 250 mL (2 mcg/mL) infusion (50 mcg/hr Intravenous New Bag/Given 10/16/20 2021)  metroNIDAZOLE (FLAGYL) IVPB 500 mg (500 mg Intravenous New Bag/Given 10/17/20 0135)  DULoxetine (CYMBALTA) DR capsule 20 mg (20 mg Oral Not Given 10/17/20 0017)  haloperidol (HALDOL) tablet 2 mg (2 mg Oral Given 10/16/20 2344)  OLANZapine (ZYPREXA) tablet 10 mg (10 mg Oral Given 10/16/20 2346)  benztropine (COGENTIN) tablet 0.5 mg (0.5 mg Oral Given 10/16/20 2347)  albuterol (VENTOLIN HFA) 108 (90 Base) MCG/ACT inhaler 1-2 puff (has no administration in time range)  LORazepam (ATIVAN) tablet 1-4 mg (has no administration in time range)    Or  LORazepam (ATIVAN) injection 1-4 mg (has no administration in time range)  thiamine tablet 100 mg ( Oral See Alternative 10/16/20 2250)    Or  thiamine (B-1) injection 100 mg (100 mg Intravenous Not Given 10/16/20 2250)  folic acid (FOLVITE) tablet 1 mg (1 mg Oral Given 10/16/20 2348)  multivitamin with minerals tablet 1 tablet (1 tablet Oral Given 10/16/20 2348)  metroNIDAZOLE (FLAGYL) IVPB 500 mg (has no administration in time range)  pantoprazole (PROTONIX) 80 mg in sodium chloride 0.9 % 100 mL IVPB (0 mg Intravenous Stopped 10/16/20 1950)  thiamine (B-1) injection 100 mg (100 mg Intravenous Given 10/16/20 1923)  cefTRIAXone (ROCEPHIN) 2 g in sodium chloride 0.9 % 100 mL IVPB (0 g  Intravenous Stopped 10/16/20 2000)  sodium chloride 0.9 % bolus 1,000 mL (1,000 mLs Intravenous New Bag/Given 10/16/20 2030)  LORazepam (ATIVAN) injection 1 mg (1 mg Intravenous Given 10/16/20 2041)     ED Discharge Orders    None       Note:  This document was prepared using Dragon voice recognition software and may include unintentional dictation errors.   Shaune Pollack, MD 10/17/20 [phone]

## 2020-10-16 NOTE — ED Notes (Signed)
Date and time results received: 10/16/20 6:35 PM (use smartphrase ".now" to insert current time)  Test: ETOH Critical Value: 356  Name of Provider Notified: Issacs, MD   Orders Received? Or Actions Taken?: Provider aware.

## 2020-10-16 NOTE — H&P (Addendum)
History and Physical   Katrina Turner ZOX:[id number] DOB: 08/13/1981 DOA: 10/16/2020  PCP: Berniece PapFlinchum, Michelle S, FNP  [facility] Dr. Sherryll BurgerShah Patient coming from: home  I have personally briefly reviewed patient's old medical [facility].  Chief Concern: black stool  HPI: Katrina Turner is a 39 y.o. female with medical history significant for daily alcohol use, chronic pancreatitis, h/o abdominal surgery, h/o Roux-en-Y gastric bypass, chronic migraine with aura, partial hysterectomy, bipolar 1 disorder, history of alcohol withdrawal syndrome, history of hypertension.   Ms. Luster LandsbergBerger presented for black tarry stool. She states she had a bowel movement the on day of presentation in the morning and then drank several beers prior to presentation.  Duration of black tarry stool is unknown as patient is acutely intoxicated.  She denies fever, chills, chest pain, shortness of breath. She endorsed nausea and vomiting and was not able to tell me the number of times she vomited and whether there was bright blood. She did say that the vomitus was brown.   Chart review:  06/27/19 - [facility] with lipase in the 7000 range in the emergency department.  Patient was seen by GI and underwent ERCP with endoscopy in 06/27/19 for removal of calculus from biliary and/ pancreatic ducts.  Patient was admitted for pain control after the procedure.  Patient was also monitored on CIWA protocol and stable hospitalization. She was discharged on lisinopril 10 mg daily during this hospitalization  ED Course: Discussed with ED provider, GI has been consulted. Admit for GI bleed in setting of chronic alcohol use.   Review of Systems: unable to complete due to active alcohol intoxication  Assessment/Plan  Active Problems:   Bipolar 1 disorder (HCC)   Alcohol abuse   History of Roux-en-Y gastric bypass   Full dentures   Chronic  pancreatitis (HCC)   GI bleed   Essential hypertension  Melena-suspect acute blood loss anemia -ED provider consulted gastroenterology specialist and agree with admission -Cannot rule out esophageal bleeding in setting of alcohol abuse  -GI recommended 1 Gi panel, c diff, serial CBC q4h, protonix gtt and octreotide gtt -Protonix and octreotide GTT were continued  Colitis on CT imaging-continue rocephin and flagyl  History of hypertension-currently normal and low normotensive -Per chart review patient was on lisinopril 10 mg daily in the past -Per chart review it appears she was prescribed lisinopril on discharge during [facility] in [facility] and it does not appear patient is currently taking this medication -Status post 2 L of normal saline bolus  -Normal saline 125 cc/h infusion continued  Chronic pancreatitis suspect secondary to history of acute pancreatitis secondary to alcohol use-patient denies abdominal pain at this time  Anxiety/depression-resumed duloxetine 20 mg daily  Daily alcohol use-active intoxication Hepatomegaly on CT -CIWA protocol started as it is unclear on the quantity and frequency of patient's ingestion of EtOH -Thiamine 100 mg IV in the ED -Thiamine 100 mg IV or p.o. -Bullock 1 mg tablet daily, multivitamins 1 tablet daily  Psychiatric diagnoses including schizophrenia and bipolar-resumed haloperidol 2 mg nightly, Zyprexa 10 mg nightly and benztropine 0.5 mg BID  -Patient would like likely benefit from psychiatric outpatient follow-up versus inpatient evaluation  Surgical history-uterus surgically absent, Roux-en-Y antecolic gastric bypass, appendix surgically absent  DVT prophylaxis: TED hose due to active GI bleed Code Status: Full code Diet: N.p.o. Family Communication: No family is at bedside, a.m. team can update the following day Disposition  Plan: Pending clinical course Consults called: Gastroenterology Admission status: Observation  with telemetry  Past Medical History:  Diagnosis Date  . Allergy   . Bipolar 1 disorder (HCC)   . Depression   . Neuromuscular disorder (HCC)   . Pancreatitis 11/28/2019   approximated   . PTSD (post-traumatic stress disorder)   . Reynolds syndrome [facility])    Past Surgical History:  Procedure Laterality Date  . APPENDECTOMY    . CESAREAN SECTION    . HERNIA REPAIR    . PARTIAL HYSTERECTOMY     ovaries present not uterus- precancerous cells of uterus   . ROUX-EN-Y GASTRIC BYPASS     Social History:  reports that she has been smoking cigarettes. She has been smoking about 1.00 pack per day. She has never used smokeless tobacco. She reports current alcohol use of about 35.0 standard drinks of alcohol per week. She reports that she does not use drugs.  Allergies  Allergen Reactions  . Other Swelling    Throat swelling per pt  . Bee Venom   . Depakote Er [Divalproex Sodium Er]   . Ivp Dye [Iodinated Diagnostic Agents]   . Nsaids   . Tegretol [Carbamazepine]    Family History  Problem Relation Age of Onset  . Diabetes Father   . Heart disease Father   . Autism Son   . Pancreatic cancer Maternal Grandmother   . Heart disease Maternal Grandfather   . Seizures Paternal Grandmother   . Prostate cancer Paternal Grandmother   . Heart disease Paternal Grandfather    Family history: Family history reviewed and positive for heart disease in father.  Prior to Admission medications   Medication Sig Start Date End Date Taking? 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 07/31/20   Malva Limes, MD  benztropine (COGENTIN) 1 MG tablet Take 0.5 tablets (0.5 mg total) by mouth 2 (two) times daily. 08/21/20 09/20/20  Delton Prairie, MD  cetirizine (ZYRTEC ALLERGY) 10 MG tablet Take 1 tablet (10 mg total) by mouth  daily. 06/05/20   Flinchum, Eula Fried, FNP  DULoxetine (CYMBALTA) 20 MG capsule Take 1 capsule (20 mg total) by mouth daily. 08/21/20 09/20/20  Delton Prairie, MD  fluticasone (FLONASE) 50 MCG/ACT nasal spray Place 1 spray into both nostrils daily. 06/05/20   Flinchum, Eula Fried, FNP  haloperidol (HALDOL) 2 MG tablet Take 1 tablet (2 mg total) by mouth at bedtime. 08/21/20 09/20/20  Delton Prairie, MD  nortriptyline (PAMELOR) 10 MG capsule Take 10 mg by mouth 2 (two) times daily. 07/19/20   [provider]  OLANZapine (ZYPREXA) 10 MG tablet Take 1 tablet (10 mg total) by mouth at bedtime. 08/21/20 09/20/20  Delton Prairie, MD   Physical Exam: Vitals:   10/16/20 1915 10/16/20 1930 10/16/20 2000 10/16/20 2100  BP: (!) 133/97 113/82 (!) 95/59 111/65  Pulse:  (!) 111 (!) 115 (!) 113  Resp: (!) 21 (!) 28 15 15   Temp:      TempSrc:      SpO2: 99% 97% 97% 90%  Weight:      Height:       Constitutional: appears actively intoxicated with etoh, appears older than chronological age, NAD Eyes: PERRL, lids and conjunctivae normal ENMT: Mucous membranes are moist. Posterior pharynx clear of any exudate or lesions. Poor dentition. Dentures in  place. Hearing appropriate. Neck: normal, supple, no masses, no thyromegaly Respiratory: clear to auscultation bilaterally, no wheezing, no crackles. Normal respiratory effort. No accessory muscle use.  Cardiovascular: Regular rate and rhythm, no murmurs / rubs / gallops. No extremity edema. 2+ pedal pulses. No carotid bruits.  Abdomen: tenderness diffused with hepatomegaly. Bowel sounds positive. Striae present on abdomen. Ecchymosis present on left flank and left sided abdomen Musculoskeletal: no clubbing / cyanosis. No joint deformity upper and lower extremities. Good ROM, no contractures, no atrophy. Normal muscle tone.  Skin: no rashes, lesions, ulcers. No induration Neurologic: Sensation intact. Strength 5/5 in all 4. Wakes to loud verbal stimuli and chest  rub Psychiatric: poor judgment and insight currently. Alert to self, states age is 55, year is 58, and when asked her location, she states she is supposed to be with her husband. Actively intoxicated  EKG: Independently reviewed, showing sinus tachycardia with rate of 148  Chest x-ray on Admission: Personally reviewed and I agree with radiologist reading as below.  CT ABDOMEN PELVIS WO CONTRAST  Result Date: 10/16/2020 CLINICAL DATA:  Nausea, vomiting, black tarry stools for 1 day with weakness EXAM: CT ABDOMEN AND PELVIS WITHOUT CONTRAST TECHNIQUE: Multidetector CT imaging of the abdomen and pelvis was performed following the standard protocol without IV contrast. COMPARISON:  CT 05/24/2019 FINDINGS: Lower chest: Lung bases are clear. Normal heart size. No pericardial effusion. Hepatobiliary: Diffuse hepatic hypoattenuation compatible with hepatic steatosis. Sparing along the gallbladder fossa. Question some mild gallbladder wall thickening. No visible calcified gallstone is seen. There is a punctate lucency towards the neck of the gallbladder (2/31) of indeterminate etiology as no additional foci of pneumobilia are identified. No biliary ductal dilatation. Pancreas: No pancreatic ductal dilatation or surrounding inflammatory changes. Spleen: Normal in size. No concerning splenic lesions. Adrenals/Urinary Tract: Normal adrenal glands. Kidneys are normally located and symmetric in size. No visible concerning renal lesion. No urolithiasis or hydronephrosis. Some mild bladder wall thickening may be related to underdistention. Stomach/Bowel: Postsurgical changes from prior Roux-en-Y antecolic gastric bypass. Intramural fat of the excluded gastric segment is nonspecific and can reflect sequela of prior inflammatory change or a lipomatous body habitus. No small bowel dilatation or convincing evidence of obstruction. The appendix is surgically absent. Thickened and edematous appearance of the colon including the  hepatic flexure which is interposed anterior to the liver. Some more mild distal colonic thickening may be present as well though is underdistended and less well evaluated. Much of the small and large bowel appears closely apposed to the anterior abdominal wall where a mesh hernia repair is noted. No resulting evidence of obstruction is seen. Vascular/Lymphatic: Atherosclerotic calcifications within the abdominal aorta and branch vessels. No aneurysm or ectasia. No enlarged abdominopelvic lymph nodes. Reproductive: Uterus is surgically absent. No concerning adnexal lesions. Other: Inflammatory changes in the right pericolic gutter adjacent the colon configuration of the mesenteric fat compatible with prior gastric bypass. No abdominopelvic free air or fluid. Prior anterior mesh hernia repair. No recurrent hernia or bowel containing hernia at this time. Musculoskeletal: Multilevel degenerative changes are present in the imaged portions of the spine. No acute osseous abnormality or suspicious osseous lesion. IMPRESSION: 1. Thickened and edematous appearance of the ascending colon and hepatic flexure which is interposed anterior to the liver. Mucosal hyperemia may be reactive and incompletely evaluated given underdistention a source of bleeding in this location is difficult to fully exclude particularly given a noncontrast technique. Some more mild distal colonic thickening may be present as well  though is underdistended and less well evaluated. Findings are concerning for an infectious or inflammatory colitis. 2. Punctate lucency towards the neck of the gallbladder of indeterminate etiology as no additional foci of pneumobilia are identified. Regardless, there is some mild gallbladder wall thickening and faint pericholecystic haze as well. Consider right upper quadrant ultrasound for further assessment. 3. Postsurgical changes from prior Roux-en-Y antecolic gastric bypass. Intramural fat of the excluded gastric  segment is nonspecific and can reflect sequela of prior inflammatory change or a lipomatous body habitus. 4. Prior anterior mesh hernia repair. No recurrent hernia or bowel containing hernia at this time. Cervical closely adherent loops of bowel without resulting obstruction from adhesion. 5. Hepatic steatosis. 6. Mild bladder wall thickening may be related to underdistention. Correlate for urinary symptoms. 7. Aortic Atherosclerosis (ICD10-I70.0). Electronically Signed   By: Kreg Shropshire M.D.   On: 10/16/2020 19:24   US Abdomen Limited RUQ (LIVER/GB)  Result Date: 10/16/2020 CLINICAL DATA:  Upper abdominal pain EXAM: ULTRASOUND ABDOMEN LIMITED RIGHT UPPER QUADRANT COMPARISON:  None. FINDINGS: Gallbladder: No gallstones or wall thickening visualized. There is no pericholecystic fluid. No sonographic Murphy sign noted by sonographer. Common bile duct: Diameter: 4 mm. No intrahepatic or extrahepatic biliary duct dilatation Liver: No focal lesion identified. Liver echogenicity is increased diffusely. Portal vein is patent on color Doppler imaging with normal direction of blood flow towards the liver. Other: None. IMPRESSION: 1. Diffuse increase in liver echogenicity, a finding indicative of hepatic steatosis, potentially with underlying parenchymal liver disease. No focal liver lesions are evident. It must be cautioned that the sensitivity of ultrasound for detection of focal liver lesions is somewhat diminished in this circumstance. 2.  Study otherwise unremarkable. Electronically Signed   By: Bretta Bang III M.D.   On: 10/16/2020 21:18   Labs on Admission: I have personally reviewed following labs  CBC: Recent Labs  Lab 10/16/20 1746  WBC 6.1  HGB 8.6*  HCT 26.2*  MCV 95.6  PLT 122*   Basic Metabolic Panel: Recent Labs  Lab 10/16/20 1746  NA 138  K 4.3  CL 103  CO2 24  GLUCOSE 103*  BUN 12  CREATININE 0.47  CALCIUM 7.7*   GFR: Estimated Creatinine Clearance: 90.9 mL/min (by C-G  formula based on SCr of 0.47 mg/dL). Liver Function Tests: Recent Labs  Lab 10/16/20 1746  AST 86*  ALT 27  ALKPHOS 199*  BILITOT 1.0  PROT 6.4*  ALBUMIN 2.6*   Coagulation Profile: Recent Labs  Lab 10/16/20 1937  INR 1.1   Urine analysis:    Component Value Date/Time   COLORURINE STRAW (A) 05/24/2019 2201   APPEARANCEUR CLEAR (A) 05/24/2019 2201   LABSPEC 1.003 (L) 05/24/2019 2201   PHURINE 5.0 05/24/2019 2201   GLUCOSEU NEGATIVE 05/24/2019 2201   HGBUR NEGATIVE 05/24/2019 2201   BILIRUBINUR negative 06/06/2020 1059   KETONESUR NEGATIVE 05/24/2019 2201   PROTEINUR Negative 06/06/2020 1059   PROTEINUR NEGATIVE 05/24/2019 2201   UROBILINOGEN 0.2 06/06/2020 1059   NITRITE negative 06/06/2020 1059   NITRITE POSITIVE (A) 05/24/2019 2201   LEUKOCYTESUR Negative 06/06/2020 1059   LEUKOCYTESUR NEGATIVE 05/24/2019 2201   Audrianna Driskill N Detta Mellin D.O. [facility]  If 12AM-7AM, please contact overnight-coverage provider If 7AM-7PM, please contact day coverage provider [email or web address]  10/16/2020, 11:08 PM

## 2020-10-16 NOTE — ED Notes (Signed)
Orthostats:   Laying: 133/97 HR 125 Sitting: 130/88 HR 130 Standing: 123/82 HR 138

## 2020-10-16 NOTE — ED Notes (Signed)
Patient transported to CT 

## 2020-10-16 NOTE — ED Notes (Signed)
Pt to ED for c/o abd pain, rectal bleeding that started today. Pt states that she hasn't noticed any blood until today. Pt has large bruise to Lt flank purple in color. Pt states "I fall a lot". Pt also has a large bruise to Rt bicep, purple in color. States "NSAIDs do this to me" but then states that she fell playing frisbie. Pt admits to having 2 beers prior to arrival "to numb the pain". When asked how much she drinks, states "3 beers a week". Adamantly denies drinking more than that. Pt's appears anxious, moving about the bed. 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

## 2020-10-16 NOTE — ED Notes (Signed)
Date and time results received: 10/16/20 9:56 PM   Test: Lactic  Critical Value: 3.3  Name of Provider Notified: Cox, DO  Orders Received? Or Actions Taken?: Provider notified.

## 2020-10-16 NOTE — ED Triage Notes (Signed)
Pt to ED POV for chief complaint of "internal bleeding" , black tarry stools since this am. C/o weakness and dizziness that started this afternoon.  Pt reports drinking "a couple beers", pt with slurred speech  Skin color WDL

## 2020-10-16 NOTE — ED Triage Notes (Signed)
Pt comes into the ED via EMS from home with c/o black tarry stool x1 day with generalized weakness. VSS pt is alert on arrival

## 2020-10-16 NOTE — ED Notes (Signed)
Pt used rest room at this time and noted to have gross melana BM at this time. Provider aware and visualized.

## 2020-10-17 ENCOUNTER — Encounter
Admission: EM | Discharge status: Against Medical Advice | Payer: Self-pay | Source: Home / Self Care | Attending: Internal Medicine

## 2020-10-17 ENCOUNTER — Inpatient Hospital Stay: Payer: 59 | Admitting: Anesthesiology

## 2020-10-17 ENCOUNTER — Encounter: Payer: Self-pay | Admitting: Internal Medicine

## 2020-10-17 DIAGNOSIS — Z90711 Acquired absence of uterus with remaining cervical stump: Secondary | ICD-10-CM | POA: Diagnosis not present

## 2020-10-17 DIAGNOSIS — K219 Gastro-esophageal reflux disease without esophagitis: Secondary | ICD-10-CM | POA: Diagnosis present

## 2020-10-17 DIAGNOSIS — Z79899 Other long term (current) drug therapy: Secondary | ICD-10-CM | POA: Diagnosis not present

## 2020-10-17 DIAGNOSIS — D500 Iron deficiency anemia secondary to blood loss (chronic): Secondary | ICD-10-CM | POA: Diagnosis present

## 2020-10-17 DIAGNOSIS — Z8249 Family history of ischemic heart disease and other diseases of the circulatory system: Secondary | ICD-10-CM | POA: Diagnosis not present

## 2020-10-17 DIAGNOSIS — F10120 Alcohol abuse with intoxication, uncomplicated: Secondary | ICD-10-CM | POA: Diagnosis present

## 2020-10-17 DIAGNOSIS — F319 Bipolar disorder, unspecified: Secondary | ICD-10-CM | POA: Diagnosis present

## 2020-10-17 DIAGNOSIS — K529 Noninfective gastroenteritis and colitis, unspecified: Secondary | ICD-10-CM | POA: Diagnosis present

## 2020-10-17 DIAGNOSIS — Z888 Allergy status to other drugs, medicaments and biological substances status: Secondary | ICD-10-CM | POA: Diagnosis not present

## 2020-10-17 DIAGNOSIS — Z9049 Acquired absence of other specified parts of digestive tract: Secondary | ICD-10-CM | POA: Diagnosis not present

## 2020-10-17 DIAGNOSIS — F419 Anxiety disorder, unspecified: Secondary | ICD-10-CM | POA: Diagnosis present

## 2020-10-17 DIAGNOSIS — I10 Essential (primary) hypertension: Secondary | ICD-10-CM | POA: Diagnosis present

## 2020-10-17 DIAGNOSIS — D6959 Other secondary thrombocytopenia: Secondary | ICD-10-CM | POA: Diagnosis present

## 2020-10-17 DIAGNOSIS — D62 Acute posthemorrhagic anemia: Secondary | ICD-10-CM | POA: Diagnosis present

## 2020-10-17 DIAGNOSIS — K861 Other chronic pancreatitis: Secondary | ICD-10-CM | POA: Diagnosis present

## 2020-10-17 DIAGNOSIS — Z91041 Radiographic dye allergy status: Secondary | ICD-10-CM | POA: Diagnosis not present

## 2020-10-17 DIAGNOSIS — Z91048 Other nonmedicinal substance allergy status: Secondary | ICD-10-CM | POA: Diagnosis not present

## 2020-10-17 DIAGNOSIS — Z20822 Contact with and (suspected) exposure to covid-19: Secondary | ICD-10-CM | POA: Diagnosis present

## 2020-10-17 DIAGNOSIS — Z91030 Bee allergy status: Secondary | ICD-10-CM | POA: Diagnosis not present

## 2020-10-17 DIAGNOSIS — K921 Melena: Secondary | ICD-10-CM | POA: Diagnosis not present

## 2020-10-17 DIAGNOSIS — R780 Finding of alcohol in blood: Secondary | ICD-10-CM | POA: Diagnosis present

## 2020-10-17 DIAGNOSIS — K922 Gastrointestinal hemorrhage, unspecified: Secondary | ICD-10-CM | POA: Diagnosis present

## 2020-10-17 DIAGNOSIS — Z9884 Bariatric surgery status: Secondary | ICD-10-CM | POA: Diagnosis not present

## 2020-10-17 DIAGNOSIS — F17210 Nicotine dependence, cigarettes, uncomplicated: Secondary | ICD-10-CM | POA: Diagnosis present

## 2020-10-17 DIAGNOSIS — F1010 Alcohol abuse, uncomplicated: Secondary | ICD-10-CM | POA: Diagnosis not present

## 2020-10-17 DIAGNOSIS — G43109 Migraine with aura, not intractable, without status migrainosus: Secondary | ICD-10-CM | POA: Diagnosis present

## 2020-10-17 HISTORY — PX: ESOPHAGOGASTRODUODENOSCOPY: SHX5428

## 2020-10-17 LAB — CBC
HCT: 20.2 % — ABNORMAL LOW (ref 36.0–46.0)
HCT: 21.7 % — ABNORMAL LOW (ref 36.0–46.0)
HCT: 20 % — ABNORMAL LOW (ref 36.0–46.0)
HCT: 23 % — ABNORMAL LOW (ref 36.0–46.0)
Hemoglobin: 6.6 g/dL — ABNORMAL LOW (ref 12.0–15.0)
Hemoglobin: 7 g/dL — ABNORMAL LOW (ref 12.0–15.0)
Hemoglobin: 6.5 g/dL — ABNORMAL LOW (ref 12.0–15.0)
Hemoglobin: 7.3 g/dL — ABNORMAL LOW (ref 12.0–15.0)
MCH: 31.4 pg (ref 26.0–34.0)
MCH: 31.4 pg (ref 26.0–34.0)
MCH: 31.9 pg (ref 26.0–34.0)
MCH: 29.7 pg (ref 26.0–34.0)
MCHC: 32.7 g/dL (ref 30.0–36.0)
MCHC: 32.3 g/dL (ref 30.0–36.0)
MCHC: 32.5 g/dL (ref 30.0–36.0)
MCHC: 31.7 g/dL (ref 30.0–36.0)
MCV: 96.2 fL (ref 80.0–100.0)
MCV: 97.3 fL (ref 80.0–100.0)
MCV: 98 fL (ref 80.0–100.0)
MCV: 93.5 fL (ref 80.0–100.0)
Platelets: 77 10*3/uL — ABNORMAL LOW (ref 150–400)
Platelets: 79 10*3/uL — ABNORMAL LOW (ref 150–400)
Platelets: 75 10*3/uL — ABNORMAL LOW (ref 150–400)
Platelets: 71 10*3/uL — ABNORMAL LOW (ref 150–400)
RBC: 2.1 MIL/uL — ABNORMAL LOW (ref 3.87–5.11)
RBC: 2.23 MIL/uL — ABNORMAL LOW (ref 3.87–5.11)
RBC: 2.04 MIL/uL — ABNORMAL LOW (ref 3.87–5.11)
RBC: 2.46 MIL/uL — ABNORMAL LOW (ref 3.87–5.11)
RDW: 21.2 % — ABNORMAL HIGH (ref 11.5–15.5)
RDW: 21.3 % — ABNORMAL HIGH (ref 11.5–15.5)
RDW: 21.2 % — ABNORMAL HIGH (ref 11.5–15.5)
RDW: 25 % — ABNORMAL HIGH (ref 11.5–15.5)
WBC: 4.8 10*3/uL (ref 4.0–10.5)
WBC: 5 10*3/uL (ref 4.0–10.5)
WBC: 4.6 10*3/uL (ref 4.0–10.5)
WBC: 3.7 10*3/uL — ABNORMAL LOW (ref 4.0–10.5)
nRBC: 0 % (ref 0.0–0.2)
nRBC: 1 % — ABNORMAL HIGH (ref 0.0–0.2)
nRBC: 0 % (ref 0.0–0.2)
nRBC: 0 % (ref 0.0–0.2)

## 2020-10-17 LAB — COMPREHENSIVE METABOLIC PANEL
ALT: 20 U/L (ref 0–44)
AST: 59 U/L — ABNORMAL HIGH (ref 15–41)
Albumin: 2.1 g/dL — ABNORMAL LOW (ref 3.5–5.0)
Alkaline Phosphatase: 149 U/L — ABNORMAL HIGH (ref 38–126)
Anion gap: 8 (ref 5–15)
BUN: 8 mg/dL (ref 6–20)
CO2: 23 mmol/L (ref 22–32)
Calcium: 6.9 mg/dL — ABNORMAL LOW (ref 8.9–10.3)
Chloride: 108 mmol/L (ref 98–111)
Creatinine, Ser: 0.42 mg/dL — ABNORMAL LOW (ref 0.44–1.00)
GFR, Estimated: >60 mL/min (ref >60)
Glucose, Bld: 123 mg/dL — ABNORMAL HIGH (ref 70–99)
Potassium: 3.6 mmol/L (ref 3.5–5.1)
Sodium: 139 mmol/L (ref 135–145)
Total Bilirubin: 1 mg/dL (ref 0.3–1.2)
Total Protein: 5.1 g/dL — ABNORMAL LOW (ref 6.5–8.1)

## 2020-10-17 LAB — HIV ANTIBODY (ROUTINE TESTING W REFLEX): HIV Screen 4th Generation wRfx: NONREACTIVE

## 2020-10-17 LAB — ABO/RH: ABO/RH(D): O POS

## 2020-10-17 LAB — LACTIC ACID, PLASMA: Lactic Acid, Venous: 1.7 mmol/L (ref 0.5–1.9)

## 2020-10-17 LAB — MAGNESIUM: Magnesium: 1.5 mg/dL — ABNORMAL LOW (ref 1.7–2.4)

## 2020-10-17 LAB — PREPARE RBC (CROSSMATCH)

## 2020-10-17 LAB — PHOSPHORUS: Phosphorus: 2.8 mg/dL (ref 2.5–4.6)

## 2020-10-17 LAB — TSH: TSH: 0.596 u[IU]/mL (ref 0.350–4.500)

## 2020-10-17 SURGERY — EGD (ESOPHAGOGASTRODUODENOSCOPY)
Anesthesia: General

## 2020-10-17 MED ORDER — MAGNESIUM SULFATE 2 GM/50ML IV SOLN
2.0000 g | Freq: Once | INTRAVENOUS | Status: AC
  Administered 2020-10-17: 2 g via INTRAVENOUS
  Filled 2020-10-17: qty 50

## 2020-10-17 MED ORDER — SODIUM CHLORIDE 0.9% IV SOLUTION: Freq: Once | INTRAVENOUS | Status: AC

## 2020-10-17 MED ORDER — LIDOCAINE HCL (PF) 2 % IJ SOLN
INTRAMUSCULAR | Status: AC
  Filled 2020-10-17: qty 5

## 2020-10-17 MED ORDER — PIPERACILLIN-TAZOBACTAM 3.375 G IVPB: 3.3750 g | Freq: Three times a day (TID) | INTRAVENOUS | Status: DC

## 2020-10-17 MED ORDER — ESMOLOL HCL 100 MG/10ML IV SOLN
INTRAVENOUS | Status: AC
  Filled 2020-10-17: qty 10

## 2020-10-17 MED ORDER — LIDOCAINE HCL (CARDIAC) PF 100 MG/5ML IV SOSY
PREFILLED_SYRINGE | INTRAVENOUS | Status: DC | PRN
  Administered 2020-10-17: 60 mg via INTRAVENOUS

## 2020-10-17 MED ORDER — ACETAMINOPHEN 325 MG PO TABS: 650.0000 mg | ORAL_TABLET | Freq: Once | ORAL | Status: DC

## 2020-10-17 MED ORDER — ESMOLOL HCL 100 MG/10ML IV SOLN
INTRAVENOUS | Status: DC | PRN
  Administered 2020-10-17: 20 mg via INTRAVENOUS
  Administered 2020-10-17: 10 mg via INTRAVENOUS

## 2020-10-17 MED ORDER — NICOTINE 14 MG/24HR TD PT24: 14.0000 mg | MEDICATED_PATCH | Freq: Every day | TRANSDERMAL | Status: DC

## 2020-10-17 MED ORDER — PROPOFOL 10 MG/ML IV BOLUS
INTRAVENOUS | Status: AC
  Filled 2020-10-17: qty 20

## 2020-10-17 MED ORDER — PROPOFOL 10 MG/ML IV BOLUS
INTRAVENOUS | Status: DC | PRN
  Administered 2020-10-17: 20 mg via INTRAVENOUS
  Administered 2020-10-17: 80 mg via INTRAVENOUS

## 2020-10-17 NOTE — Progress Notes (Signed)
MD notified of pt signing out of hospital AMA. Pt educated on the risk of leaving against medical advise. Pt stated she wasn't going to stay any longer.

## 2020-10-17 NOTE — Progress Notes (Signed)
Patient became increasing agitated, verbally abusive to staff, as well as trying to get out of bed during the post procedure recovery period. Due to safety concerns, she was transported back to room 156 in her bed and face to face report was given to her nurse Inetta Fermo.

## 2020-10-17 NOTE — Transfer of Care (Signed)
Immediate Anesthesia Transfer of Care Note  Patient: Katrina Turner  Procedure(s) Performed: ESOPHAGOGASTRODUODENOSCOPY (EGD) (N/A )  Patient Location: PACU  Anesthesia Type:General  Level of Consciousness: drowsy  Airway & Oxygen Therapy: Patient Spontanous Breathing  Post-op Assessment: Report given to RN and Post -op Vital signs reviewed and stable  Post vital signs: Reviewed and stable  Last Vitals:  Vitals Value Taken Time  BP 141/106 10/17/20 1753  Temp    Pulse 115 10/17/20 1755  Resp 17 10/17/20 1755  SpO2 97 % 10/17/20 1755  Vitals shown include unvalidated device data.  Last Pain:  Vitals:   10/17/20 1718  TempSrc: Temporal  PainSc: 8          Complications: No complications documented.

## 2020-10-17 NOTE — Anesthesia Postprocedure Evaluation (Signed)
Anesthesia Post Note  Patient: Katrina Turner  Procedure(s) Performed: ESOPHAGOGASTRODUODENOSCOPY (EGD) (N/A )  Patient location during evaluation: Endoscopy Anesthesia Type: General Level of consciousness: awake and alert Pain management: pain level controlled Vital Signs Assessment: post-procedure vital signs reviewed and stable Respiratory status: spontaneous breathing, nonlabored ventilation, respiratory function stable and patient connected to nasal cannula oxygen Cardiovascular status: blood pressure returned to baseline and stable Postop Assessment: no apparent nausea or vomiting Anesthetic complications: no   No complications documented.   Last Vitals:  Vitals:   10/17/20 1827 10/17/20 1924  BP: (!) 146/104 (!) 147/70  Pulse: 97 (!) 128  Resp: 18 (!) 40  Temp: 36.7 C (!) 39.5 C  SpO2: 100% 97%    Last Pain:  Vitals:   10/17/20 1924  TempSrc: Oral  PainSc:                  Lenard Simmer

## 2020-10-17 NOTE — Progress Notes (Signed)
Page to md pt is ST and SVT up to 160's per tele and 130's currently pt states no chest pain but SOB (she said in the mornings she is normally SOB)

## 2020-10-17 NOTE — Progress Notes (Signed)
Page to MD Hgb 6.6 this am

## 2020-10-17 NOTE — H&P (View-Only) (Signed)
GI Inpatient Consult Note  Reason for Consult: Epigastric abdominal pain, coffee-ground emesis, acute blood loss anemia, colitis   Attending Requesting Consult: Dr. Ardean Larsen, MD  History of Present Illness: Katrina Turner is a 39 y.o. female seen for evaluation of multiple GI complaints, including epigastric abdominal pain with coffee-ground emesis, acute blood loss anemia, colitis, melena, and hematochezia, at the request of Dr. Jacqulyn Bath. Pt has a PMH of Bipolar I disorder, chronic migraines with aura, chronic pancreatitis 2/2 daily EtOH abuse, s/p Roux-en-Y gastric bypass, s/p partial hysterectomy, and HTN. She presented to the [facility] ED yesterday for chief complaint of 1-day history of epigastric abdominal pain and yesterday starting to have multiple episodes of coffee-ground emesis and melena. Upon presentation to the ED, she was tachycardic, dehydrated, and intoxicated with ethanol level 356. She was found to have anemia with hemoglobin 8.6 (baseline 10-11). Patient reportedly continued to have melanotic stool last night and one episode this afternoon. She had one episode of coffee-ground emesis in the emergency room. Hemoglobin this morning decreased to 6.6 and she was transfused 1 unit pRBCs. Post-transfusion hemoglobin was 7.3. CT scan without contrast showed thickened and edematous appearance of ascending colon and hepatic flexure which is interposed anterior to the liver. Findings suggestive of infectious versus inflammatory colitis. Patient was empirically started on broad-spectrum antibiotics with Rocephin and Flagyl. She was started on Protonix gtt and octreotide. Patient seen and examined this afternoon with husband bedside. Patient continues to report epigastric abdominal pain. She had one episode of black, tarry stool this afternoon. She reports she drinks alcohol daily, usually 3-4 beers with rum or moonshine. She has also been taking 800 mg ibuprofen four times daily for the past month for  migraines and neck pain.     Past Medical History:  Past Medical History:  Diagnosis Date   Allergy    Bipolar 1 disorder (HCC)    Depression    GERD (gastroesophageal reflux disease)    Neuromuscular disorder (HCC)    Pancreatitis 11/28/2019   approximated    PTSD (post-traumatic stress disorder)    Reynolds syndrome [facility])     Problem List: Patient Active Problem List   Diagnosis Date Noted   GI bleed 10/16/2020   Essential hypertension 10/16/2020   Low hemoglobin 07/11/2020   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   Hot flashes 06/05/2020   Full dentures 06/05/2020   Chronic pancreatitis (HCC) 06/05/2020   Chronic migraine without aura without status migrainosus, not intractable 06/05/2020   Seasonal allergies 06/05/2020   High risk medication use 06/05/2020   Personality disorder (HCC) 06/05/2020   Urinary symptom or sign 06/05/2020   Skin lesion 06/05/2020   Alcohol abuse 06/27/2019   Bipolar 1 disorder (HCC) 05/24/2019   H/O gastric bypass 05/24/2019   Acute pancreatitis 05/24/2019   Alcohol use 05/24/2019   Tobacco use disorder 03/04/2019   Abdominal pain 03/03/2019   Lupus (HCC) 03/03/2019   Raynaud's phenomenon without gangrene 03/03/2019    Past Surgical History: Past Surgical History:  Procedure Laterality Date   APPENDECTOMY     CESAREAN SECTION     HERNIA REPAIR     PARTIAL HYSTERECTOMY     ovaries present not uterus- precancerous cells of uterus    ROUX-EN-Y GASTRIC BYPASS      Allergies: Allergies  Allergen Reactions   Other Swelling    Throat swelling per pt   Bee Venom    Depakote Er [Divalproex Sodium Er]    Ivp Dye [Iodinated [employer]]    Nsaids    Tegretol [Carbamazepine]     Home Medications: Medications Prior to Admission  Medication Sig Dispense Refill Last Dose   albuterol (VENTOLIN HFA) 108 (90 Base) MCG/ACT inhaler Inhale 1-2 puffs into the lungs every 6 (six) hours  as needed for wheezing or shortness of breath (excercise induced asthma she 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 18 g 1 prn at prn   benztropine (COGENTIN) 1 MG tablet Take 0.5 tablets (0.5 mg total) by mouth 2 (two) times daily. 30 tablet 0    cetirizine (ZYRTEC ALLERGY) 10 MG tablet Take 1 tablet (10 mg total) by mouth daily. (Patient not taking: Reported on 10/16/2020) 30 tablet 0 Not Taking at Unknown time   DULoxetine (CYMBALTA) 20 MG capsule Take 1 capsule (20 mg total) by mouth daily. 30 capsule 0    fluticasone (FLONASE) 50 MCG/ACT nasal spray Place 1 spray into both nostrils daily. (Patient not taking: Reported on 10/16/2020) 16 g 0 Not Taking at Unknown time   haloperidol (HALDOL) 2 MG tablet Take 1 tablet (2 mg total) by mouth at bedtime. 30 tablet 0    nortriptyline (PAMELOR) 10 MG capsule Take 10 mg by mouth 2 (two) times daily. (Patient not taking: Reported on 10/16/2020)   Not Taking at Unknown time   OLANZapine (ZYPREXA) 10 MG tablet Take 1 tablet (10 mg total) by mouth at bedtime. 30 tablet 0    Home medication reconciliation was completed with the patient.   Scheduled Inpatient Medications:    [MAR Hold] acetaminophen  650 mg Oral Once   [MAR Hold] benztropine  0.5 mg Oral BID   [MAR Hold] DULoxetine  20 mg Oral Daily   [MAR Hold] folic acid  1 mg Oral Daily   [MAR Hold] haloperidol  2 mg Oral QHS   [MAR Hold] multivitamin with minerals  1 tablet Oral Daily   [MAR Hold] nicotine  14 mg Transdermal Daily   [MAR Hold] OLANZapine  10 mg Oral QHS   [MAR Hold] thiamine  100 mg Oral Daily   Or   [MAR Hold] thiamine  100 mg Intravenous Daily    Continuous Inpatient Infusions:    sodium chloride 125 mL/hr at 10/17/20 0605   octreotide  (SANDOSTATIN)    IV infusion 50 mcg/hr (10/17/20 0641)   pantoprozole (PROTONIX) infusion 8 mg/hr (10/17/20 1007)   [MAR Hold] piperacillin-tazobactam (ZOSYN)  IV      PRN Inpatient Medications:  [MAR Hold] albuterol, [MAR Hold] LORazepam  **OR** [MAR Hold] LORazepam  Family History: family history includes Autism in her son; Diabetes in her father; Heart disease in her father, maternal grandfather, and paternal grandfather; Pancreatic cancer in her maternal grandmother; Prostate cancer in her paternal grandmother; Seizures in her paternal grandmother.  The patient's family history is negative for inflammatory bowel disorders, GI malignancy, or solid organ transplantation.  Social History:   reports that she has been smoking cigarettes. She has been smoking about 1.00 pack per day. She has never used smokeless tobacco. She reports current alcohol use of about 35.0 standard drinks of alcohol per week. She reports that she does not use drugs. The patient denies ETOH, tobacco, or drug use.   Review of Systems: Constitutional: Weight is stable.  Eyes: No changes in vision. ENT: No oral lesions, sore throat.  GI: see HPI.  Heme/Lymph: No easy bruising.  CV: No chest pain.  GU: No hematuria.  Integumentary: No rashes.  Neuro: No headaches.  Psych: No depression/anxiety.  Endocrine:  No heat/cold intolerance.  Allergic/Immunologic: No urticaria.  Resp: No cough, SOB.  Musculoskeletal: No joint swelling.    Physical Examination: BP (!) 137/105    Pulse 100    Temp (!) 97 F (36.1 C) (Temporal)    Resp 18    Ht 5\' 7"  (1.702 m)    Wt 63 kg    SpO2 98%    BMI 21.75 kg/m  Gen: NAD, alert and oriented x 4 HEENT: PEERLA, EOMI, Neck: supple, no JVD or thyromegaly Chest: CTA bilaterally, no wheezes, crackles, or other adventitious sounds CV: RRR, no m/g/c/r Abd: soft, NT, ND, +BS in all four quadrants; no HSM, guarding, ridigity, or rebound tenderness Ext: no edema, well perfused with 2+ pulses, Skin: no rash or lesions noted Lymph: no LAD  Data: Lab Results  Component Value Date   WBC 3.7 (L) 10/17/2020   HGB 7.3 (L) 10/17/2020   HCT 23.0 (L) 10/17/2020   MCV 93.5 10/17/2020   PLT 71 (L) 10/17/2020   Recent Labs  Lab  10/17/20 0705 10/17/20 0947 10/17/20 1452  HGB 7.0* 6.5* 7.3*   Lab Results  Component Value Date   NA 139 10/17/2020   K 3.6 10/17/2020   CL 108 10/17/2020   CO2 23 10/17/2020   BUN 8 10/17/2020   CREATININE 0.42 (L) 10/17/2020   Lab Results  Component Value Date   ALT 20 10/17/2020   AST 59 (H) 10/17/2020   ALKPHOS 149 (H) 10/17/2020   BILITOT 1.0 10/17/2020   Recent Labs  Lab 10/16/20 1937  INR 1.1   CT abd/pelvis without contrast 10/16/2020: IMPRESSION: 1. Thickened and edematous appearance of the ascending colon and hepatic flexure which is interposed anterior to the liver. Mucosal hyperemia may be reactive and incompletely evaluated given underdistention a source of bleeding in this location is difficult to fully exclude particularly given a noncontrast technique. Some more mild distal colonic thickening may be present as well though is underdistended and less well evaluated. Findings are concerning for an infectious or inflammatory colitis. 2. Punctate lucency towards the neck of the gallbladder of indeterminate etiology as no additional foci of pneumobilia are identified. Regardless, there is some mild gallbladder wall thickening and faint pericholecystic haze as well. Consider right upper quadrant ultrasound for further assessment. 3. Postsurgical changes from prior Roux-en-Y antecolic gastric bypass. Intramural fat of the excluded gastric segment is nonspecific and can reflect sequela of prior inflammatory change or a lipomatous body habitus. 4. Prior anterior mesh hernia repair. No recurrent hernia or bowel containing hernia at this time. Cervical closely adherent loops of bowel without resulting obstruction from adhesion. 5. Hepatic steatosis. 6. Mild bladder wall thickening may be related to underdistention. Correlate for urinary symptoms. 7. Aortic Atherosclerosis (ICD10-I70.0).   Assessment/Plan:  39 y/o Caucasian female with a PMH of Bipolar I  disorder, chronic migraines with aura, chronic pancreatitis 2/2 daily EtOH abuse, s/p Roux-en-Y gastric bypass, s/p partial hysterectomy, and HTN presented to the [facility] ED last night for epigastric abdominal pain with coffee-ground emesis, melena  1. Epigastric abdominal pain 2. Coffee-ground emesis 3. Melena 4. Acute blood loss anemia 5. Colitis - infectious stool studies pending 6. Overuse of NSAIDs 7. EtOH abuse - on CIWA protocol  Patient's clinical presentation with epigastric abdominal pain with associated coffee-ground emesis and melena is concerning for upper GI bleed.  Patient's hemoglobin has decreased from baseline 10-11 to 6.4.  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  Differential includes peptic ulcer disease, gastritis, esophageal varices, Dieulafoy's lesion, duodenitis, esophagitis, small bowel source, AVMs.  I advise continuing Protonix gtt and octreotide.  Emergent endoscopy is advised for endoluminal evaluation and potential hemostasis.  We discussed procedure details and indications today.  She consents to proceed.  Plan:  -EGD this afternoon with Dr. Norma Fredricksonoledo -Further recommendations after procedure -Advise collecting GI PCR, c diff when available -Following  I reviewed the risks (including bleeding, perforation, infection, anesthesia complications, cardiac/respiratory complications), benefits and alternatives of EGD. Patient consents to proceed.    Thank you for the consult. Please call with questions or concerns.  Mickle MalloryGranville M Nikoloz Huy, PA-C [facility] [phone] (Cell)

## 2020-10-17 NOTE — Progress Notes (Signed)
Pt agitated after EGD signed out and dc via wc

## 2020-10-17 NOTE — Anesthesia Preprocedure Evaluation (Signed)
Anesthesia Evaluation  Patient identified by MRN, date of birth, ID band Patient awake    Reviewed: Allergy & Precautions, H&P , NPO status , Patient's Chart, lab work & pertinent test results, reviewed documented beta blocker date and time   History of Anesthesia Complications (+) MALIGNANT HYPERTHERMIANegative for: history of anesthetic complications  Airway Mallampati: II  TM Distance: >3 FB Neck ROM: full    Dental  (+) Dental Advidsory Given, Teeth Intact   Pulmonary neg shortness of breath, asthma , neg COPD, neg recent URI, Current Smoker and Patient abstained from smoking.,    Pulmonary exam normal breath sounds clear to auscultation       Cardiovascular Exercise Tolerance: Good negative cardio ROS Normal cardiovascular exam Rhythm:regular Rate:Normal     Neuro/Psych  Headaches, neg Seizures PSYCHIATRIC DISORDERS Anxiety Depression Bipolar Disorder  Neuromuscular disease    GI/Hepatic GERD  ,(+)     substance abuse  , Hepatitis -  Endo/Other  negative endocrine ROS  Renal/GU negative Renal ROS  negative genitourinary   Musculoskeletal   Abdominal   Peds  Hematology negative hematology ROS (+)   Anesthesia Other Findings Past Medical History: No date: Allergy No date: Bipolar 1 disorder (HCC) No date: Depression No date: GERD (gastroesophageal reflux disease) No date: Neuromuscular disorder (HCC) 11/28/2019: Pancreatitis     Comment:  approximated  No date: PTSD (post-traumatic stress disorder) 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: Reynolds syndrome (HCC)   Reproductive/Obstetrics negative OB ROS                             Anesthesia Physical Anesthesia Plan  ASA: III and emergent  Anesthesia Plan: General   Post-op Pain Management:    Induction: Intravenous  PONV Risk Score and Plan: 2 and Propofol infusion and TIVA  Airway Management Planned: Natural Airway and Nasal Cannula  Additional  Equipment:   Intra-op Plan:   Post-operative Plan:   Informed Consent: I have reviewed the patients History and Physical, chart, labs and discussed the procedure including the risks, benefits and alternatives for the proposed anesthesia with the patient or authorized representative who has indicated his/her understanding and acceptance.     Dental Advisory Given  Plan Discussed with: Anesthesiologist, CRNA and Surgeon  Anesthesia Plan Comments:         Anesthesia Quick Evaluation

## 2020-10-17 NOTE — Progress Notes (Signed)
Pt sent back to her room via recovery nurse. Report given to shift nurse.

## 2020-10-17 NOTE — Interval H&P Note (Signed)
History and Physical Interval Note:  10/17/2020 5:31 PM  Katrina Turner  has presented today for surgery, with the diagnosis of Melena, coffee-ground emesis, epigastric abd pain, acute blood loss anemia.  The various methods of treatment have been discussed with the patient and family. After consideration of risks, benefits and other options for treatment, the patient has consented to  Procedure(s): ESOPHAGOGASTRODUODENOSCOPY (EGD) (N/A) as a surgical intervention.  The patient's history has been reviewed, patient examined, no change in status, stable for surgery.  I have reviewed the patient's chart and labs.  Questions were answered to the patient's satisfaction.     [place], [name]

## 2020-10-17 NOTE — Progress Notes (Signed)
PROGRESS NOTE    Katrina Turner  ZHY:[id number] DOB: 12/28/1980 DOA: 10/16/2020 PCP: Berniece PapFlinchum, Michelle S, FNP   Brief Narrative:  Katrina Turner is a 39 y.o. female with medical history significant for daily alcohol use, chronic pancreatitis, h/o abdominal surgery, h/o Roux-en-Y gastric bypass, chronic migraine with aura, partial hysterectomy, bipolar 1 disorder, history of alcohol withdrawal syndrome,  hypertension presented with melena. Patient admitted for GI bleeding in the setting of chronic alcohol use.  GI has been consulted.  Assessment & Plan:   Acute blood loss anemia: In the setting of acute GI bleed. -H&H dropped from 8.6-6.6 this morning.  Transfuse 1 unit PRBC.  Repeat H&H is 7.3. -Await GI recommendations. -Continue PPI and octreotide -GI panel is pending.  Monitor H&H closely.  Avoid NSAIDs.  Transfuse as needed. -Check iron studies, B12 and folate.  Acute colitis: -Change Rocephin and Flagyl to Zosyn due to Flagyl shortage. -Continue with IV hydration and pain medications  Acute alcohol intoxication: -Elevated ethanol level. -Continue on CIWA protocol -Continue thiamine, folic acid and multivitamins.  Thrombocytopenia: Likely in the setting of ethanol abuse.  Monitor platelet count closely.  Transfuse as needed.  Hypertension: On lisinopril at home. -Hold for now.  Continue IV fluids.  Monitor blood pressure closely.  Chronic pancreatitis: Suspect secondary to alcohol abuse  Anxiety/depression: Continue duloxetine  Psychiatric diagnoses-schizophrenia/bipolar disorder: Continue home meds of haloperidol, Zyprexa, benztropine. -Follow-up with psychiatrist outpatient  Tobacco abuse: Nicotine patch Counseled about cessation  DVT prophylaxis: SCD Code Status: Full code Family Communication: None present at bedside.  Plan of care discussed with patient in length and she verbalized understanding and agreed with it. Disposition Plan: Pending GI  evaluation  Consultants:   GI  Procedures:   None  Antimicrobials:   Rocephin  Flagyl  Zosyn  Status is: Inpatient  Dispo: The patient is from: Home              Anticipated d/c is to: Home              Anticipated d/c date is: 10/19/2020              Patient currently not medically stable for the discharge     Subjective: Patient seen and examined.  Complaining of severe abdominal pain, nausea, vomiting.  Tells me that she feels weak and not feeling well overall.  Objective: Vitals:   10/17/20 1118 10/17/20 1142 10/17/20 1415 10/17/20 1529  BP: 118/84 114/86 116/81 (!) 132/99  Pulse: 98 (!) 109 (!) 102 97  Resp: 20 18 18 18   Temp: 99.8 F (37.7 C) 98.6 F (37 C) 99.1 F (37.3 C) 98.7 F (37.1 C)  TempSrc: Oral Oral Oral Oral  SpO2: 95% 96% 97% 95%  Weight:      Height:        Intake/Output Summary (Last 24 hours) at 10/17/2020 1535 Last data filed at 10/17/2020 1415 Gross per 24 hour  Intake 420 ml  Output 700 ml  Net -280 ml   Filed Weights   10/16/20 1731 10/16/20 1743  Weight: 63.5 kg 63 kg    Examination:  General exam: Appears calm and comfortable, on room air, appears weak and lethargic and pale. Respiratory system: Clear to auscultation. Respiratory effort normal. Cardiovascular system: S1 & S2 heard, RRR. No JVD, murmurs, rubs, gallops or clicks. No pedal edema. Gastrointestinal system: Denies abdominal tenderness positive, no guarding, no rigidity, bowel sounds positive Central nervous system: Alert and oriented. No focal neurological deficits. Extremities: Symmetric  5 x 5 power. Skin: No rashes, lesions or ulcers Psychiatry: Judgement and insight appear normal. Mood & affect appropriate.    Data Reviewed: I have personally reviewed following labs and imaging studies  CBC: Recent Labs  Lab 10/16/20 1746 10/17/20 0526 10/17/20 0705 10/17/20 0947 10/17/20 1452  WBC 6.1 4.8 5.0 4.6 3.7*  HGB 8.6* 6.6* 7.0* 6.5* 7.3*  HCT 26.2*  20.2* 21.7* 20.0* 23.0*  MCV 95.6 96.2 97.3 98.0 93.5  PLT 122* 77* 79* 75* 71*   Basic Metabolic Panel: Recent Labs  Lab 10/16/20 1746 10/17/20 0526  NA 138 139  K 4.3 3.6  CL 103 108  CO2 24 23  GLUCOSE 103* 123*  BUN 12 8  CREATININE 0.47 0.42*  CALCIUM 7.7* 6.9*  MG  --  1.5*  PHOS  --  2.8   GFR: Estimated Creatinine Clearance: 90.9 mL/min (A) (by C-G formula based on SCr of 0.42 mg/dL (L)). Liver Function Tests: Recent Labs  Lab 10/16/20 1746 10/17/20 0526  AST 86* 59*  ALT 27 20  ALKPHOS 199* 149*  BILITOT 1.0 1.0  PROT 6.4* 5.1*  ALBUMIN 2.6* 2.1*   No results for input(s): LIPASE, AMYLASE in the last 168 hours. No results for input(s): AMMONIA in the last 168 hours. Coagulation Profile: Recent Labs  Lab 10/16/20 1937  INR 1.1   Cardiac Enzymes: No results for input(s): CKTOTAL, CKMB, CKMBINDEX, TROPONINI in the last 168 hours. BNP (last 3 results) No results for input(s): PROBNP in the last 8760 hours. HbA1C: No results for input(s): HGBA1C in the last 72 hours. CBG: No results for input(s): GLUCAP in the last 168 hours. Lipid Profile: No results for input(s): CHOL, HDL, LDLCALC, TRIG, CHOLHDL, LDLDIRECT in the last 72 hours. Thyroid Function Tests: Recent Labs    10/17/20 0526  TSH 0.596   Anemia Panel: No results for input(s): VITAMINB12, FOLATE, FERRITIN, TIBC, IRON, RETICCTPCT in the last 72 hours. Sepsis Labs: Recent Labs  Lab 10/16/20 2102 10/17/20 0705  LATICACIDVEN 3.3* 1.7    Recent Results (from the past 240 hour(s))  Respiratory Panel by RT PCR (Flu A&B, Covid) - Nasopharyngeal Swab     Status: None   Collection Time: 10/16/20  9:02 PM   Specimen: Nasopharyngeal Swab  Result Value Ref Range Status   SARS Coronavirus 2 by RT PCR NEGATIVE NEGATIVE Final    Comment: (NOTE) SARS-CoV-2 target nucleic acids are NOT DETECTED.  The SARS-CoV-2 RNA is generally detectable in upper respiratoy specimens during the acute phase of  infection. The lowest concentration of SARS-CoV-2 viral copies this assay can detect is 131 copies/mL. A negative result does not preclude SARS-Cov-2 infection and should not be used as the sole basis for treatment or other patient management decisions. A negative result may occur with  improper specimen collection/handling, submission of specimen other than nasopharyngeal swab, presence of viral mutation(s) within the areas targeted by this assay, and inadequate number of viral copies (<131 copies/mL). A negative result must be combined with clinical observations, patient history, and epidemiological information. The expected result is Negative.  Fact Sheet for Patients:  https://www.moore.com/  Fact Sheet for Healthcare Providers:  https://www.young.biz/  This test is no t yet approved or cleared by the [place] FDA and  has been authorized for detection and/or diagnosis of SARS-CoV-2 by FDA under an Emergency Use Authorization (EUA). This EUA will remain  in effect (meaning this test can be used) for the duration of the COVID-19 declaration under Section 564(b)(1)  of the Act, 21 U.S.C. section 360bbb-3(b)(1), unless the authorization is terminated or revoked sooner.     Influenza A by PCR NEGATIVE NEGATIVE Final   Influenza B by PCR NEGATIVE NEGATIVE Final    Comment: (NOTE) The Xpert Xpress SARS-CoV-2/FLU/RSV assay is intended as an aid in  the diagnosis of influenza from Nasopharyngeal swab specimens and  should not be used as a sole basis for treatment. Nasal washings and  aspirates are unacceptable for Xpert Xpress SARS-CoV-2/FLU/RSV  testing.  Fact Sheet for Patients: https://www.moore.com/  Fact Sheet for Healthcare Providers: https://www.young.biz/  This test is not yet approved or cleared by the [place] FDA and  has been authorized for detection and/or diagnosis of SARS-CoV-2  by  FDA under an Emergency Use Authorization (EUA). This EUA will remain  in effect (meaning this test can be used) for the duration of the  Covid-19 declaration under Section 564(b)(1) of the Act, 21  U.S.C. section 360bbb-3(b)(1), unless the authorization is  terminated or revoked. Performed at [facility], [address] [place], [place] [address]       Radiology Studies: CT ABDOMEN PELVIS WO CONTRAST  Result Date: 10/16/2020 CLINICAL DATA:  Nausea, vomiting, black tarry stools for 1 day with weakness EXAM: CT ABDOMEN AND PELVIS WITHOUT CONTRAST TECHNIQUE: Multidetector CT imaging of the abdomen and pelvis was performed following the standard protocol without IV contrast. COMPARISON:  CT 05/24/2019 FINDINGS: Lower chest: Lung bases are clear. Normal heart size. No pericardial effusion. Hepatobiliary: Diffuse hepatic hypoattenuation compatible with hepatic steatosis. Sparing along the gallbladder fossa. Question some mild gallbladder wall thickening. No visible calcified gallstone is seen. There is a punctate lucency towards the neck of the gallbladder (2/31) of indeterminate etiology as no additional foci of pneumobilia are identified. No biliary ductal dilatation. Pancreas: No pancreatic ductal dilatation or surrounding inflammatory changes. Spleen: Normal in size. No concerning splenic lesions. Adrenals/Urinary Tract: Normal adrenal glands. Kidneys are normally located and symmetric in size. No visible concerning renal lesion. No urolithiasis or hydronephrosis. Some mild bladder wall thickening may be related to underdistention. Stomach/Bowel: Postsurgical changes from prior Roux-en-Y antecolic gastric bypass. Intramural fat of the excluded gastric segment is nonspecific and can reflect sequela of prior inflammatory change or a lipomatous body habitus. No small bowel dilatation or convincing evidence of obstruction. The appendix is surgically absent. Thickened and edematous appearance  of the colon including the hepatic flexure which is interposed anterior to the liver. Some more mild distal colonic thickening may be present as well though is underdistended and less well evaluated. Much of the small and large bowel appears closely apposed to the anterior abdominal wall where a mesh hernia repair is noted. No resulting evidence of obstruction is seen. Vascular/Lymphatic: Atherosclerotic calcifications within the abdominal aorta and branch vessels. No aneurysm or ectasia. No enlarged abdominopelvic lymph nodes. Reproductive: Uterus is surgically absent. No concerning adnexal lesions. Other: Inflammatory changes in the right pericolic gutter adjacent the colon configuration of the mesenteric fat compatible with prior gastric bypass. No abdominopelvic free air or fluid. Prior anterior mesh hernia repair. No recurrent hernia or bowel containing hernia at this time. Musculoskeletal: Multilevel degenerative changes are present in the imaged portions of the spine. No acute osseous abnormality or suspicious osseous lesion. IMPRESSION: 1. Thickened and edematous appearance of the ascending colon and hepatic flexure which is interposed anterior to the liver. Mucosal hyperemia may be reactive and incompletely evaluated given underdistention a source of bleeding in this location is difficult to  fully exclude particularly given a noncontrast technique. Some more mild distal colonic thickening may be present as well though is underdistended and less well evaluated. Findings are concerning for an infectious or inflammatory colitis. 2. Punctate lucency towards the neck of the gallbladder of indeterminate etiology as no additional foci of pneumobilia are identified. Regardless, there is some mild gallbladder wall thickening and faint pericholecystic haze as well. Consider right upper quadrant ultrasound for further assessment. 3. Postsurgical changes from prior Roux-en-Y antecolic gastric bypass. Intramural fat of  the excluded gastric segment is nonspecific and can reflect sequela of prior inflammatory change or a lipomatous body habitus. 4. Prior anterior mesh hernia repair. No recurrent hernia or bowel containing hernia at this time. Cervical closely adherent loops of bowel without resulting obstruction from adhesion. 5. Hepatic steatosis. 6. Mild bladder wall thickening may be related to underdistention. Correlate for urinary symptoms. 7. Aortic Atherosclerosis (ICD10-I70.0). Electronically Signed   By: Kreg Shropshire M.D.   On: 10/16/2020 19:24   US Abdomen Limited RUQ (LIVER/GB)  Result Date: 10/16/2020 CLINICAL DATA:  Upper abdominal pain EXAM: ULTRASOUND ABDOMEN LIMITED RIGHT UPPER QUADRANT COMPARISON:  None. FINDINGS: Gallbladder: No gallstones or wall thickening visualized. There is no pericholecystic fluid. No sonographic Murphy sign noted by sonographer. Common bile duct: Diameter: 4 mm. No intrahepatic or extrahepatic biliary duct dilatation Liver: No focal lesion identified. Liver echogenicity is increased diffusely. Portal vein is patent on color Doppler imaging with normal direction of blood flow towards the liver. Other: None. IMPRESSION: 1. Diffuse increase in liver echogenicity, a finding indicative of hepatic steatosis, potentially with underlying parenchymal liver disease. No focal liver lesions are evident. It must be cautioned that the sensitivity of ultrasound for detection of focal liver lesions is somewhat diminished in this circumstance. 2.  Study otherwise unremarkable. Electronically Signed   By: Bretta Bang III M.D.   On: 10/16/2020 21:18    Scheduled Meds: . acetaminophen  650 mg Oral Once  . benztropine  0.5 mg Oral BID  . DULoxetine  20 mg Oral Daily  . folic acid  1 mg Oral Daily  . haloperidol  2 mg Oral QHS  . multivitamin with minerals  1 tablet Oral Daily  . nicotine  14 mg Transdermal Daily  . OLANZapine  10 mg Oral QHS  . thiamine  100 mg Oral Daily   Or  . thiamine   100 mg Intravenous Daily   Continuous Infusions: . sodium chloride 125 mL/hr at 10/17/20 0605  . octreotide  (SANDOSTATIN)    IV infusion 50 mcg/hr (10/17/20 0641)  . pantoprozole (PROTONIX) infusion 8 mg/hr (10/17/20 1007)  . piperacillin-tazobactam (ZOSYN)  IV       LOS: 0 days   Time spent: 40 minutes  Theophilus Walz Estill Cotta, MD [facility]  If 7PM-7AM, please contact night-coverage [email or web address] 10/17/2020, 3:35 PM

## 2020-10-17 NOTE — Anesthesia Procedure Notes (Signed)
Procedure Name: MAC Date/Time: 10/17/2020 5:34 PM Performed by: Jerrye Noble, CRNA Pre-anesthesia Checklist: Patient identified, Emergency Drugs available, Suction available and Patient being monitored Patient Re-evaluated:Patient Re-evaluated prior to induction Oxygen Delivery Method: Nasal cannula

## 2020-10-17 NOTE — Op Note (Signed)
[facility] Gastroenterology Patient Name: Katrina Turner Procedure Date: 10/17/2020 5:31 PM MRN: [id number] Account #: [id number] Date of Birth: [birth year] Admit Type: Outpatient Age: 39 Room: [facility] ENDO ROOM 2 Gender: Female Note Status: Finalized Procedure:             Upper GI endoscopy Indications:           Coffee-ground emesis, Melena Providers:             Boykin Nearing. Viktoria Gruetzmacher MD, MD Medicines:             Propofol per Anesthesia Complications:         No immediate complications. Estimated blood loss: None. Procedure:             Pre-Anesthesia Assessment:                        - The risks and benefits of the procedure and the                         sedation options and risks were discussed with the                         patient. All questions were answered and informed                         consent was obtained.                        - Patient identification and proposed procedure were                         verified prior to the procedure by the nurse. The                         procedure was verified in the procedure room.                        - ASA Grade Assessment: III - A patient with severe                         systemic disease.                        - After reviewing the risks and benefits, the patient                         was deemed in satisfactory condition to undergo the                         procedure.                        After obtaining informed consent, the endoscope was                         passed under direct vision. Throughout the procedure,                         the patient's blood pressure, pulse, and oxygen  saturations were monitored continuously. The Endoscope                         was introduced through the mouth, and advanced to the                         afferent and efferent jejunal loops. The upper GI                         endoscopy was accomplished without difficulty. The                          patient tolerated the procedure well. Findings:      The examined esophagus was normal.      There is no endoscopic evidence of bleeding, ulcerations, varices or       mass in the entire esophagus.      Evidence of a gastric bypass was found. A gastric pouch with a small       size was found. The staple line appeared intact. The gastrojejunal       anastomosis was characterized by ulceration. This was traversed. The       pouch-to-jejunum limb measured 20 cm from the anastomosis and was       characterized by healthy appearing mucosa. The duodenum-to-jejunum limb       was not examined as it could not be traversed.      The examined jejunum was normal.      Ulcer on the gastric side of G-J anastomosis was Deep, white based       without stigmata of active or recent bleeding. No visible vessel,       adherent clot was noted. Impression:            - Normal esophagus.                        - Gastric bypass with a small-sized pouch and intact                         staple line. Gastrojejunal anastomosis characterized                         by ulceration.                        - Normal examined jejunum.                        - No specimens collected. Recommendation:        - Return patient to hospital ward for ongoing care.                        - Clear liquid diet.                        - Give Protonix (pantoprazole): 8 mg/hr IV by                         continuous infusion.                        - Clear liquid diet.                        -  Mechanical soft diet for 1 day.                        - Continue CIWA protocol. Less than 30% risk or                         significant ulcer rebleeding based upon endoscopic                         assessment.                        - Following daily H/H, clinical status. Procedure Code(s):     --- Professional ---                        [phone], Esophagogastroduodenoscopy, flexible,                         transoral;  diagnostic, including collection of                         specimen(s) by brushing or washing, when performed                         (separate procedure) Diagnosis Code(s):     --- Professional ---                        K92.1, Melena (includes Hematochezia)                        K92.0, Hematemesis                        K28.9, Gastrojejunal ulcer, unspecified as acute or                         chronic, without hemorrhage or perforation CPT copyright 2019 [employer]. All rights reserved. The codes documented in this report are preliminary and upon coder review may  be revised to meet current compliance requirements. Stanton Kidney MD, MD 10/17/2020 6:08:01 PM This report has been signed electronically. Number of Addenda: 0 Note Initiated On: 10/17/2020 5:31 PM Estimated Blood Loss:  Estimated blood loss: none.      [facility]

## 2020-10-17 NOTE — Consult Note (Signed)
GI Inpatient Consult Note ° °Reason for Consult: Epigastric abdominal pain, coffee-ground emesis, acute blood loss anemia, colitis °  °Attending Requesting Consult: Dr. Rinka Pahwani, MD ° °History of Present Illness: °Katrina Turner is a 39 y.o. female seen for evaluation of multiple GI complaints, including epigastric abdominal pain with coffee-ground emesis, acute blood loss anemia, colitis, melena, and hematochezia, at the request of Dr. Pahwani. Pt has a PMH of Bipolar I disorder, chronic migraines with aura, chronic pancreatitis 2/2 daily EtOH abuse, s/p Roux-en-Y gastric bypass, s/p partial hysterectomy, and HTN. She presented to the [facility] ED yesterday for chief complaint of 1-day history of epigastric abdominal pain and yesterday starting to have multiple episodes of coffee-ground emesis and melena. Upon presentation to the ED, she was tachycardic, dehydrated, and intoxicated with ethanol level 356. She was found to have anemia with hemoglobin 8.6 (baseline 10-11). Patient reportedly continued to have melanotic stool last night and one episode this afternoon. She had one episode of coffee-ground emesis in the emergency room. Hemoglobin this morning decreased to 6.6 and she was transfused 1 unit pRBCs. Post-transfusion hemoglobin was 7.3. CT scan without contrast showed thickened and edematous appearance of ascending colon and hepatic flexure which is interposed anterior to the liver. Findings suggestive of infectious versus inflammatory colitis. Patient was empirically started on broad-spectrum antibiotics with Rocephin and Flagyl. She was started on Protonix gtt and octreotide. Patient seen and examined this afternoon with husband bedside. Patient continues to report epigastric abdominal pain. She had one episode of black, tarry stool this afternoon. She reports she drinks alcohol daily, usually 3-4 beers with rum or moonshine. She has also been taking 800 mg ibuprofen four times daily for the past month for  migraines and neck pain.  ° ° ° °Past Medical History:  °Past Medical History:  °Diagnosis Date  °• Allergy   °• Bipolar 1 disorder (HCC)   °• Depression   °• GERD (gastroesophageal reflux disease)   °• Neuromuscular disorder (HCC)   °• Pancreatitis 11/28/2019  ° approximated   °• PTSD (post-traumatic stress disorder)   °• Reynolds syndrome (HCC)   °  °Problem List: °Patient Active Problem List  ° Diagnosis Date Noted  °• GI bleed 10/16/2020  °• Essential hypertension 10/16/2020  °• Low hemoglobin 07/11/2020  °• 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  °• Hot flashes 06/05/2020  °• Full dentures 06/05/2020  °• Chronic pancreatitis (HCC) 06/05/2020  °• Chronic migraine without aura without status migrainosus, not intractable 06/05/2020  °• Seasonal allergies 06/05/2020  °• High risk medication use 06/05/2020  °• Personality disorder (HCC) 06/05/2020  °• Urinary symptom or sign 06/05/2020  °• Skin lesion 06/05/2020  °• Alcohol abuse 06/27/2019  °• Bipolar 1 disorder (HCC) 05/24/2019  °• H/O gastric bypass 05/24/2019  °• Acute pancreatitis 05/24/2019  °• Alcohol use 05/24/2019  °• Tobacco use disorder 03/04/2019  °• Abdominal pain 03/03/2019  °• Lupus (HCC) 03/03/2019  °• Raynaud's phenomenon without gangrene 03/03/2019  °  °Past Surgical History: °Past Surgical History:  °Procedure Laterality Date  °• APPENDECTOMY    °• CESAREAN SECTION    °• HERNIA REPAIR    °• PARTIAL HYSTERECTOMY    ° ovaries present not uterus- precancerous cells of uterus   °• ROUX-EN-Y GASTRIC BYPASS    °  °Allergies: °Allergies  °Allergen Reactions  °• Other Swelling  °  Throat swelling per pt  °• Bee Venom   °• Depakote Er [Divalproex Sodium Er]   °• Ivp Dye [Iodinated Diagnostic   Agents]   °• Nsaids   °• Tegretol [Carbamazepine]   °  °Home Medications: °Medications Prior to Admission  °Medication Sig Dispense Refill Last Dose  °• albuterol (VENTOLIN HFA) 108 (90 Base) MCG/ACT inhaler Inhale 1-2 puffs into the lungs every 6 (six) hours  as needed for wheezing or shortness of breath (excercise induced asthma she 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 18 g 1 prn at prn  °• benztropine (COGENTIN) 1 MG tablet Take 0.5 tablets (0.5 mg total) by mouth 2 (two) times daily. 30 tablet 0   °• cetirizine (ZYRTEC ALLERGY) 10 MG tablet Take 1 tablet (10 mg total) by mouth daily. (Patient not taking: Reported on 10/16/2020) 30 tablet 0 Not Taking at Unknown time  °• DULoxetine (CYMBALTA) 20 MG capsule Take 1 capsule (20 mg total) by mouth daily. 30 capsule 0   °• fluticasone (FLONASE) 50 MCG/ACT nasal spray Place 1 spray into both nostrils daily. (Patient not taking: Reported on 10/16/2020) 16 g 0 Not Taking at Unknown time  °• haloperidol (HALDOL) 2 MG tablet Take 1 tablet (2 mg total) by mouth at bedtime. 30 tablet 0   °• nortriptyline (PAMELOR) 10 MG capsule Take 10 mg by mouth 2 (two) times daily. (Patient not taking: Reported on 10/16/2020)   Not Taking at Unknown time  °• OLANZapine (ZYPREXA) 10 MG tablet Take 1 tablet (10 mg total) by mouth at bedtime. 30 tablet 0   ° °Home medication reconciliation was completed with the patient.  ° °Scheduled Inpatient Medications: °  °• [MAR Hold] acetaminophen  650 mg Oral Once  °• [MAR Hold] benztropine  0.5 mg Oral BID  °• [MAR Hold] DULoxetine  20 mg Oral Daily  °• [MAR Hold] folic acid  1 mg Oral Daily  °• [MAR Hold] haloperidol  2 mg Oral QHS  °• [MAR Hold] multivitamin with minerals  1 tablet Oral Daily  °• [MAR Hold] nicotine  14 mg Transdermal Daily  °• [MAR Hold] OLANZapine  10 mg Oral QHS  °• [MAR Hold] thiamine  100 mg Oral Daily  ° Or  °• [MAR Hold] thiamine  100 mg Intravenous Daily  ° ° °Continuous Inpatient Infusions: °  °• sodium chloride 125 mL/hr at 10/17/20 0605  °• octreotide  (SANDOSTATIN)    IV infusion 50 mcg/hr (10/17/20 0641)  °• pantoprozole (PROTONIX) infusion 8 mg/hr (10/17/20 1007)  °• [MAR Hold] piperacillin-tazobactam (ZOSYN)  IV    ° ° °PRN Inpatient Medications:  °[MAR Hold] albuterol, [MAR Hold] LORazepam  **OR** [MAR Hold] LORazepam ° °Family History: °family history includes Autism in her son; Diabetes in her father; Heart disease in her father, maternal grandfather, and paternal grandfather; Pancreatic cancer in her maternal grandmother; Prostate cancer in her paternal grandmother; Seizures in her paternal grandmother.  The patient's family history is negative for inflammatory bowel disorders, GI malignancy, or solid organ transplantation. ° °Social History:  ° reports that she has been smoking cigarettes. She has been smoking about 1.00 pack per day. She has never used smokeless tobacco. She reports current alcohol use of about 35.0 standard drinks of alcohol per week. She reports that she does not use drugs. The patient denies ETOH, tobacco, or drug use.  ° °Review of Systems: °Constitutional: Weight is stable.  °Eyes: No changes in vision. °ENT: No oral lesions, sore throat.  °GI: see HPI.  °Heme/Lymph: No easy bruising.  °CV: No chest pain.  °GU: No hematuria.  °Integumentary: No rashes.  °Neuro: No headaches.  °Psych: No depression/anxiety.  °Endocrine:   No heat/cold intolerance.  °Allergic/Immunologic: No urticaria.  °Resp: No cough, SOB.  °Musculoskeletal: No joint swelling.  °  °Physical Examination: °BP (!) 137/105    Pulse 100    Temp (!) 97 °F (36.1 °C) (Temporal)    Resp 18    Ht 5' 7" (1.702 m)    Wt 63 kg    SpO2 98%    BMI 21.75 kg/m²  °Gen: NAD, alert and oriented x 4 °HEENT: PEERLA, EOMI, °Neck: supple, no JVD or thyromegaly °Chest: CTA bilaterally, no wheezes, crackles, or other adventitious sounds °CV: RRR, no m/g/c/r °Abd: soft, NT, ND, +BS in all four quadrants; no HSM, guarding, ridigity, or rebound tenderness °Ext: no edema, well perfused with 2+ pulses, °Skin: no rash or lesions noted °Lymph: no LAD ° °Data: °Lab Results  °Component Value Date  ° WBC 3.7 (L) 10/17/2020  ° HGB 7.3 (L) 10/17/2020  ° HCT 23.0 (L) 10/17/2020  ° MCV 93.5 10/17/2020  ° PLT 71 (L) 10/17/2020  ° °Recent Labs  °Lab  10/17/20 °0705 10/17/20 °0947 10/17/20 °1452  °HGB 7.0* 6.5* 7.3*  ° °Lab Results  °Component Value Date  ° NA 139 10/17/2020  ° K 3.6 10/17/2020  ° CL 108 10/17/2020  ° CO2 23 10/17/2020  ° BUN 8 10/17/2020  ° CREATININE 0.42 (L) 10/17/2020  ° °Lab Results  °Component Value Date  ° ALT 20 10/17/2020  ° AST 59 (H) 10/17/2020  ° ALKPHOS 149 (H) 10/17/2020  ° BILITOT 1.0 10/17/2020  ° °Recent Labs  °Lab 10/16/20 °1937  °INR 1.1  ° °CT abd/pelvis without contrast 10/16/2020: °IMPRESSION: °1. Thickened and edematous appearance of the ascending colon and °hepatic flexure which is interposed anterior to the liver. Mucosal °hyperemia may be reactive and incompletely evaluated given °underdistention a source of bleeding in this location is difficult °to fully exclude particularly given a noncontrast technique. Some °more mild distal colonic thickening may be present as well though is °underdistended and less well evaluated. Findings are concerning for °an infectious or inflammatory colitis. °2. Punctate lucency towards the neck of the gallbladder of °indeterminate etiology as no additional foci of pneumobilia are °identified. Regardless, there is some mild gallbladder wall °thickening and faint pericholecystic haze as well. Consider right °upper quadrant ultrasound for further assessment. °3. Postsurgical changes from prior Roux-en-Y antecolic gastric °bypass. Intramural fat of the excluded gastric segment is °nonspecific and can reflect sequela of prior inflammatory change or °a lipomatous body habitus. °4. Prior anterior mesh hernia repair. No recurrent hernia or bowel °containing hernia at this time. Cervical closely adherent loops of °bowel without resulting obstruction from adhesion. °5. Hepatic steatosis. °6. Mild bladder wall thickening may be related to underdistention. °Correlate for urinary symptoms. °7. Aortic Atherosclerosis (ICD10-I70.0). ° ° °Assessment/Plan: ° °39 y/o Caucasian female with a PMH of Bipolar I  disorder, chronic migraines with aura, chronic pancreatitis 2/2 daily EtOH abuse, s/p Roux-en-Y gastric bypass, s/p partial hysterectomy, and HTN presented to the [facility] ED last night for epigastric abdominal pain with coffee-ground emesis, melena ° °1. Epigastric abdominal pain °2. Coffee-ground emesis °3. Melena °4. Acute blood loss anemia °5. Colitis - infectious stool studies pending °6. Overuse of NSAIDs °7. EtOH abuse - on CIWA protocol ° °Patient's clinical presentation with epigastric abdominal pain with associated coffee-ground emesis and melena is concerning for upper GI bleed.  Patient's hemoglobin has decreased from baseline 10-11 to 6.4.  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    Differential includes peptic ulcer disease, gastritis, esophageal varices, Dieulafoy's lesion, duodenitis, esophagitis, small bowel source, AVMs.  I advise continuing Protonix gtt and octreotide.  Emergent endoscopy is advised for endoluminal evaluation and potential hemostasis.  We discussed procedure details and indications today.  She consents to proceed. ° °Plan: ° °-EGD this afternoon with Dr. Toledo °-Further recommendations after procedure °-Advise collecting GI PCR, c diff when available °-Following ° °I reviewed the risks (including bleeding, perforation, infection, anesthesia complications, cardiac/respiratory complications), benefits and alternatives of EGD. Patient consents to proceed.  ° ° °Thank you for the consult. Please call with questions or concerns. ° °Massiah Longanecker M Kie Calvin, PA-C °[facility] °[phone] °[phone] (Cell) ° °

## 2020-10-17 NOTE — Plan of Care (Signed)

## 2020-10-18 ENCOUNTER — Encounter: Payer: Self-pay | Admitting: Internal Medicine

## 2020-10-18 LAB — BPAM RBC
Blood Product Expiration Date: 202111142359
ISSUE DATE / TIME: 202111101124
Unit Type and Rh: 5100

## 2020-10-18 LAB — TYPE AND SCREEN
ABO/RH(D): O POS
Antibody Screen: NEGATIVE
Unit division: 0

## 2020-10-18 NOTE — Discharge Summary (Signed)
Physician Discharge Summary  Katrina Turner RCV:[id number] DOB: 1981/02/22 DOA: 10/16/2020  PCP: Berniece Pap, FNP  Admit date: 10/16/2020 Discharge date: 10/18/2020  Admitted From: home Disposition: home  Recommendations for Outpatient Follow-up:  None  Brief/Interim Summary: Katrina Turner a 39 y.o.femalewith medical history significant fordaily alcohol use, chronic pancreatitis, h/o abdominal surgery, h/o Roux-en-Y gastric bypass, chronic migraine with aura, partial hysterectomy, bipolar 1 disorder, history of alcohol withdrawal syndrome, hypertension presented with melena. GI consulted for further evaluation.  Acute blood loss anemia: In the setting of acute GI bleed. -H&H dropped from 8.6-6.6.  Transfused 1 unit PRBC.  Repeat H&H was 7.3. -Patient started on PPI gtt. and octreotide -GI consulted.  Patient underwent EGD on 11/10 which showed GJ-anastomosis ulceration.  After EGD patient became increasingly agitated, verbally abusive to staff, trying to get out of bed.  Staff tried to help her however patient left AMA on 11/10  Acute colitis: -Reviewed CT abdomen/pelvis.  Patient started on IV fluids, Rocephin and Flagyl which was switched to Zosyn.   Acute alcohol intoxication: -Elevated ethanol level. -Continued on CIWA protocol -Continued thiamine, folic acid and multivitamins.  Thrombocytopenia: Likely in the setting of ethanol abuse.   Hypertension:  Held lisinopril.  Continue with IV fluids.  Chronic pancreatitis: Suspect secondary to alcohol abuse  Anxiety/depression: Continued duloxetine  Psychiatric diagnoses-schizophrenia/bipolar disorder: Continued home meds of haloperidol, Zyprexa, benztropine.  Tobacco abuse:  Ordered nicotine patch Counseled about cessation   Discharge Diagnoses:  Acute blood loss anemia Acute colitis Acute alcohol intoxication Thrombocytopenia Hypertension Chronic pancreatitis Anxiety/depression Psychiatric  diagnosis schizophrenia/bipolar disorder Tobacco abuse  Discharge Instructions   Allergies as of 10/17/2020      Reactions   Other Swelling   Throat swelling per pt   Bee Venom    Depakote Er [divalproex Sodium Er]    Ivp Dye [iodinated Diagnostic Agents]    Nsaids    Tegretol [carbamazepine]       Medication List    ASK your doctor about these medications   albuterol 108 (90 Base) MCG/ACT inhaler Commonly known as: VENTOLIN HFA Inhale 1-2 puffs into the lungs every 6 (six) hours as needed for wheezing or shortness of breath (excercise induced asthma she 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   benztropine 1 MG tablet Commonly known as: COGENTIN Take 0.5 tablets (0.5 mg total) by mouth 2 (two) times daily.   cetirizine 10 MG tablet Commonly known as: ZyrTEC Allergy Take 1 tablet (10 mg total) by mouth daily.   DULoxetine 20 MG capsule Commonly known as: Cymbalta Take 1 capsule (20 mg total) by mouth daily.   fluticasone 50 MCG/ACT nasal spray Commonly known as: Flonase Place 1 spray into both nostrils daily.   haloperidol 2 MG tablet Commonly known as: HALDOL Take 1 tablet (2 mg total) by mouth at bedtime.   nortriptyline 10 MG capsule Commonly known as: PAMELOR Take 10 mg by mouth 2 (two) times daily.   OLANZapine 10 MG tablet Commonly known as: ZyPREXA Take 1 tablet (10 mg total) by mouth at bedtime.       Allergies  Allergen Reactions  . Other Swelling    Throat swelling per pt  . Bee Venom   . Depakote Er [Divalproex Sodium Er]   . Ivp Dye [Iodinated Diagnostic Agents]   . Nsaids   . Tegretol [Carbamazepine]     Consultations:  GI   Procedures/Studies: CT ABDOMEN PELVIS WO CONTRAST  Result Date: 10/16/2020 CLINICAL DATA:  Nausea, vomiting, black tarry stools for 1  day with weakness EXAM: CT ABDOMEN AND PELVIS WITHOUT CONTRAST TECHNIQUE: Multidetector CT imaging of the abdomen and pelvis was performed following the standard protocol without IV contrast.  COMPARISON:  CT 05/24/2019 FINDINGS: Lower chest: Lung bases are clear. Normal heart size. No pericardial effusion. Hepatobiliary: Diffuse hepatic hypoattenuation compatible with hepatic steatosis. Sparing along the gallbladder fossa. Question some mild gallbladder wall thickening. No visible calcified gallstone is seen. There is a punctate lucency towards the neck of the gallbladder (2/31) of indeterminate etiology as no additional foci of pneumobilia are identified. No biliary ductal dilatation. Pancreas: No pancreatic ductal dilatation or surrounding inflammatory changes. Spleen: Normal in size. No concerning splenic lesions. Adrenals/Urinary Tract: Normal adrenal glands. Kidneys are normally located and symmetric in size. No visible concerning renal lesion. No urolithiasis or hydronephrosis. Some mild bladder wall thickening may be related to underdistention. Stomach/Bowel: Postsurgical changes from prior Roux-en-Y antecolic gastric bypass. Intramural fat of the excluded gastric segment is nonspecific and can reflect sequela of prior inflammatory change or a lipomatous body habitus. No small bowel dilatation or convincing evidence of obstruction. The appendix is surgically absent. Thickened and edematous appearance of the colon including the hepatic flexure which is interposed anterior to the liver. Some more mild distal colonic thickening may be present as well though is underdistended and less well evaluated. Much of the small and large bowel appears closely apposed to the anterior abdominal wall where a mesh hernia repair is noted. No resulting evidence of obstruction is seen. Vascular/Lymphatic: Atherosclerotic calcifications within the abdominal aorta and branch vessels. No aneurysm or ectasia. No enlarged abdominopelvic lymph nodes. Reproductive: Uterus is surgically absent. No concerning adnexal lesions. Other: Inflammatory changes in the right pericolic gutter adjacent the colon configuration of the  mesenteric fat compatible with prior gastric bypass. No abdominopelvic free air or fluid. Prior anterior mesh hernia repair. No recurrent hernia or bowel containing hernia at this time. Musculoskeletal: Multilevel degenerative changes are present in the imaged portions of the spine. No acute osseous abnormality or suspicious osseous lesion. IMPRESSION: 1. Thickened and edematous appearance of the ascending colon and hepatic flexure which is interposed anterior to the liver. Mucosal hyperemia may be reactive and incompletely evaluated given underdistention a source of bleeding in this location is difficult to fully exclude particularly given a noncontrast technique. Some more mild distal colonic thickening may be present as well though is underdistended and less well evaluated. Findings are concerning for an infectious or inflammatory colitis. 2. Punctate lucency towards the neck of the gallbladder of indeterminate etiology as no additional foci of pneumobilia are identified. Regardless, there is some mild gallbladder wall thickening and faint pericholecystic haze as well. Consider right upper quadrant ultrasound for further assessment. 3. Postsurgical changes from prior Roux-en-Y antecolic gastric bypass. Intramural fat of the excluded gastric segment is nonspecific and can reflect sequela of prior inflammatory change or a lipomatous body habitus. 4. Prior anterior mesh hernia repair. No recurrent hernia or bowel containing hernia at this time. Cervical closely adherent loops of bowel without resulting obstruction from adhesion. 5. Hepatic steatosis. 6. Mild bladder wall thickening may be related to underdistention. Correlate for urinary symptoms. 7. Aortic Atherosclerosis (ICD10-I70.0). Electronically Signed   By: Kreg Shropshire M.D.   On: 10/16/2020 19:24   US Abdomen Limited RUQ (LIVER/GB)  Result Date: 10/16/2020 CLINICAL DATA:  Upper abdominal pain EXAM: ULTRASOUND ABDOMEN LIMITED RIGHT UPPER QUADRANT  COMPARISON:  None. FINDINGS: Gallbladder: No gallstones or wall thickening visualized. There is no pericholecystic fluid. No  sonographic Eulah PontMurphy sign noted by sonographer. Common bile duct: Diameter: 4 mm. No intrahepatic or extrahepatic biliary duct dilatation Liver: No focal lesion identified. Liver echogenicity is increased diffusely. Portal vein is patent on color Doppler imaging with normal direction of blood flow towards the liver. Other: None. IMPRESSION: 1. Diffuse increase in liver echogenicity, a finding indicative of hepatic steatosis, potentially with underlying parenchymal liver disease. No focal liver lesions are evident. It must be cautioned that the sensitivity of ultrasound for detection of focal liver lesions is somewhat diminished in this circumstance. 2.  Study otherwise unremarkable. Electronically Signed   By: Bretta BangWilliam  Woodruff III M.D.   On: 10/16/2020 21:18       Subjective: Patient Left AMA  Discharge Exam: Vitals:   10/17/20 1827 10/17/20 1924  BP: (!) 146/104 (!) 147/70  Pulse: 97 (!) 128  Resp: 18 (!) 40  Temp: 98 F (36.7 C) (!) 103.1 F (39.5 C)  SpO2: 100% 97%      The results of significant diagnostics from this hospitalization (including imaging, microbiology, ancillary and laboratory) are listed below for reference.     Microbiology: Recent Results (from the past 240 hour(s))  Respiratory Panel by RT PCR (Flu A&B, Covid) - Nasopharyngeal Swab     Status: None   Collection Time: 10/16/20  9:02 PM   Specimen: Nasopharyngeal Swab  Result Value Ref Range Status   SARS Coronavirus 2 by RT PCR NEGATIVE NEGATIVE Final    Comment: (NOTE) SARS-CoV-2 target nucleic acids are NOT DETECTED.  The SARS-CoV-2 RNA is generally detectable in upper respiratoy specimens during the acute phase of infection. The lowest concentration of SARS-CoV-2 viral copies this assay can detect is 131 copies/mL. A negative result does not preclude SARS-Cov-2 infection and should  not be used as the sole basis for treatment or other patient management decisions. A negative result may occur with  improper specimen collection/handling, submission of specimen other than nasopharyngeal swab, presence of viral mutation(s) within the areas targeted by this assay, and inadequate number of viral copies (<131 copies/mL). A negative result must be combined with clinical observations, patient history, and epidemiological information. The expected result is Negative.  Fact Sheet for Patients:  https://www.moore.com/https://www.fda.gov/media/142436/download  Fact Sheet for Healthcare Providers:  https://www.young.biz/https://www.fda.gov/media/142435/download  This test is no t yet approved or cleared by the [place] FDA and  has been authorized for detection and/or diagnosis of SARS-CoV-2 by FDA under an Emergency Use Authorization (EUA). This EUA will remain  in effect (meaning this test can be used) for the duration of the COVID-19 declaration under Section 564(b)(1) of the Act, 21 U.S.C. section 360bbb-3(b)(1), unless the authorization is terminated or revoked sooner.     Influenza A by PCR NEGATIVE NEGATIVE Final   Influenza B by PCR NEGATIVE NEGATIVE Final    Comment: (NOTE) The Xpert Xpress SARS-CoV-2/FLU/RSV assay is intended as an aid in  the diagnosis of influenza from Nasopharyngeal swab specimens and  should not be used as a sole basis for treatment. Nasal washings and  aspirates are unacceptable for Xpert Xpress SARS-CoV-2/FLU/RSV  testing.  Fact Sheet for Patients: https://www.moore.com/https://www.fda.gov/media/142436/download  Fact Sheet for Healthcare Providers: https://www.young.biz/https://www.fda.gov/media/142435/download  This test is not yet approved or cleared by the [place] FDA and  has been authorized for detection and/or diagnosis of SARS-CoV-2 by  FDA under an Emergency Use Authorization (EUA). This EUA will remain  in effect (meaning this test can be used) for the duration of the  Covid-19 declaration under  Section  564(b)(1) of the Act, 21  U.S.C. section 360bbb-3(b)(1), unless the authorization is  terminated or revoked. Performed at [facility], [address] [place], [place] [address]      Labs: BNP (last 3 results) No results for input(s): BNP in the last 8760 hours. Basic Metabolic Panel: Recent Labs  Lab 10/16/20 1746 10/17/20 0526  NA 138 139  K 4.3 3.6  CL 103 108  CO2 24 23  GLUCOSE 103* 123*  BUN 12 8  CREATININE 0.47 0.42*  CALCIUM 7.7* 6.9*  MG  --  1.5*  PHOS  --  2.8   Liver Function Tests: Recent Labs  Lab 10/16/20 1746 10/17/20 0526  AST 86* 59*  ALT 27 20  ALKPHOS 199* 149*  BILITOT 1.0 1.0  PROT 6.4* 5.1*  ALBUMIN 2.6* 2.1*   No results for input(s): LIPASE, AMYLASE in the last 168 hours. No results for input(s): AMMONIA in the last 168 hours. CBC: Recent Labs  Lab 10/16/20 1746 10/17/20 0526 10/17/20 0705 10/17/20 0947 10/17/20 1452  WBC 6.1 4.8 5.0 4.6 3.7*  HGB 8.6* 6.6* 7.0* 6.5* 7.3*  HCT 26.2* 20.2* 21.7* 20.0* 23.0*  MCV 95.6 96.2 97.3 98.0 93.5  PLT 122* 77* 79* 75* 71*   Cardiac Enzymes: No results for input(s): CKTOTAL, CKMB, CKMBINDEX, TROPONINI in the last 168 hours. BNP: Invalid input(s): POCBNP CBG: No results for input(s): GLUCAP in the last 168 hours. D-Dimer No results for input(s): DDIMER in the last 72 hours. Hgb A1c No results for input(s): HGBA1C in the last 72 hours. Lipid Profile No results for input(s): CHOL, HDL, LDLCALC, TRIG, CHOLHDL, LDLDIRECT in the last 72 hours. Thyroid function studies Recent Labs    10/17/20 0526  TSH 0.596   Anemia work up No results for input(s): VITAMINB12, FOLATE, FERRITIN, TIBC, IRON, RETICCTPCT in the last 72 hours. Urinalysis    Component Value Date/Time   COLORURINE STRAW (A) 05/24/2019 2201   APPEARANCEUR CLEAR (A) 05/24/2019 2201   LABSPEC 1.003 (L) 05/24/2019 2201   PHURINE 5.0 05/24/2019 2201   GLUCOSEU NEGATIVE 05/24/2019 2201   HGBUR NEGATIVE  05/24/2019 2201   BILIRUBINUR negative 06/06/2020 1059   KETONESUR NEGATIVE 05/24/2019 2201   PROTEINUR Negative 06/06/2020 1059   PROTEINUR NEGATIVE 05/24/2019 2201   UROBILINOGEN 0.2 06/06/2020 1059   NITRITE negative 06/06/2020 1059   NITRITE POSITIVE (A) 05/24/2019 2201   LEUKOCYTESUR Negative 06/06/2020 1059   LEUKOCYTESUR NEGATIVE 05/24/2019 2201   Sepsis Labs Invalid input(s): PROCALCITONIN,  WBC,  LACTICIDVEN Microbiology Recent Results (from the past 240 hour(s))  Respiratory Panel by RT PCR (Flu A&B, Covid) - Nasopharyngeal Swab     Status: None   Collection Time: 10/16/20  9:02 PM   Specimen: Nasopharyngeal Swab  Result Value Ref Range Status   SARS Coronavirus 2 by RT PCR NEGATIVE NEGATIVE Final    Comment: (NOTE) SARS-CoV-2 target nucleic acids are NOT DETECTED.  The SARS-CoV-2 RNA is generally detectable in upper respiratoy specimens during the acute phase of infection. The lowest concentration of SARS-CoV-2 viral copies this assay can detect is 131 copies/mL. A negative result does not preclude SARS-Cov-2 infection and should not be used as the sole basis for treatment or other patient management decisions. A negative result may occur with  improper specimen collection/handling, submission of specimen other than nasopharyngeal swab, presence of viral mutation(s) within the areas targeted by this assay, and inadequate number of viral copies (<131 copies/mL). A negative result must be combined with clinical observations,  patient history, and epidemiological information. The expected result is Negative.  Fact Sheet for Patients:  https://www.moore.com/  Fact Sheet for Healthcare Providers:  https://www.young.biz/  This test is no t yet approved or cleared by the [place] FDA and  has been authorized for detection and/or diagnosis of SARS-CoV-2 by FDA under an Emergency Use Authorization (EUA). This EUA will remain  in  effect (meaning this test can be used) for the duration of the COVID-19 declaration under Section 564(b)(1) of the Act, 21 U.S.C. section 360bbb-3(b)(1), unless the authorization is terminated or revoked sooner.     Influenza A by PCR NEGATIVE NEGATIVE Final   Influenza B by PCR NEGATIVE NEGATIVE Final    Comment: (NOTE) The Xpert Xpress SARS-CoV-2/FLU/RSV assay is intended as an aid in  the diagnosis of influenza from Nasopharyngeal swab specimens and  should not be used as a sole basis for treatment. Nasal washings and  aspirates are unacceptable for Xpert Xpress SARS-CoV-2/FLU/RSV  testing.  Fact Sheet for Patients: https://www.moore.com/  Fact Sheet for Healthcare Providers: https://www.young.biz/  This test is not yet approved or cleared by the [place] FDA and  has been authorized for detection and/or diagnosis of SARS-CoV-2 by  FDA under an Emergency Use Authorization (EUA). This EUA will remain  in effect (meaning this test can be used) for the duration of the  Covid-19 declaration under Section 564(b)(1) of the Act, 21  U.S.C. section 360bbb-3(b)(1), unless the authorization is  terminated or revoked. Performed at [facility], [address] [place], [place] [address]      Time coordinating discharge: Over 30 minutes  SIGNED:   Ollen Bowl, MD  [facility] 10/18/2020, 3:54 PM Pager   If 7PM-7AM, please contact night-coverage [email or web address]

## 2021-02-12 ENCOUNTER — Encounter: Payer: Self-pay | Admitting: Adult Health

## 2021-02-12 ENCOUNTER — Ambulatory Visit (INDEPENDENT_AMBULATORY_CARE_PROVIDER_SITE_OTHER): Payer: 59 | Admitting: Adult Health

## 2021-02-12 ENCOUNTER — Other Ambulatory Visit: Payer: Self-pay

## 2021-02-12 VITALS — BP 121/92 | HR 101 | Temp 97.7°F | Resp 16 | Wt 159.2 lb

## 2021-02-12 DIAGNOSIS — Z8719 Personal history of other diseases of the digestive system: Secondary | ICD-10-CM

## 2021-02-12 DIAGNOSIS — G4700 Insomnia, unspecified: Secondary | ICD-10-CM

## 2021-02-12 DIAGNOSIS — Z9884 Bariatric surgery status: Secondary | ICD-10-CM

## 2021-02-12 DIAGNOSIS — F17209 Nicotine dependence, unspecified, with unspecified nicotine-induced disorders: Secondary | ICD-10-CM

## 2021-02-12 DIAGNOSIS — F1010 Alcohol abuse, uncomplicated: Secondary | ICD-10-CM

## 2021-02-12 DIAGNOSIS — F609 Personality disorder, unspecified: Secondary | ICD-10-CM

## 2021-02-12 DIAGNOSIS — M329 Systemic lupus erythematosus, unspecified: Secondary | ICD-10-CM

## 2021-02-12 DIAGNOSIS — Z8709 Personal history of other diseases of the respiratory system: Secondary | ICD-10-CM

## 2021-02-12 DIAGNOSIS — I10 Essential (primary) hypertension: Secondary | ICD-10-CM

## 2021-02-12 DIAGNOSIS — F10920 Alcohol use, unspecified with intoxication, uncomplicated: Secondary | ICD-10-CM

## 2021-02-12 DIAGNOSIS — K860 Alcohol-induced chronic pancreatitis: Secondary | ICD-10-CM

## 2021-02-12 DIAGNOSIS — G43709 Chronic migraine without aura, not intractable, without status migrainosus: Secondary | ICD-10-CM

## 2021-02-12 DIAGNOSIS — F319 Bipolar disorder, unspecified: Secondary | ICD-10-CM

## 2021-02-12 DIAGNOSIS — J4520 Mild intermittent asthma, uncomplicated: Secondary | ICD-10-CM

## 2021-02-12 MED ORDER — HYDROCHLOROTHIAZIDE 25 MG PO TABS: 12.5000 mg | ORAL_TABLET | Freq: Every day | ORAL | 0 refills | Status: DC

## 2021-02-12 MED ORDER — TRAZODONE HCL 50 MG PO TABS: 25.0000 mg | ORAL_TABLET | Freq: Every day | ORAL | 0 refills | Status: DC

## 2021-02-12 MED ORDER — DULOXETINE HCL 20 MG PO CPEP: 20.0000 mg | ORAL_CAPSULE | Freq: Every day | ORAL | 0 refills | Status: DC

## 2021-02-12 MED ORDER — CETIRIZINE HCL 10 MG PO TABS: 10.0000 mg | ORAL_TABLET | Freq: Every day | ORAL | 0 refills | Status: DC

## 2021-02-12 MED ORDER — ALBUTEROL SULFATE HFA 108 (90 BASE) MCG/ACT IN AERS: 1.0000 | INHALATION_SPRAY | Freq: Four times a day (QID) | RESPIRATORY_TRACT | 1 refills | Status: DC | PRN

## 2021-02-12 NOTE — Patient Instructions (Addendum)
Urgent referral to gastroenterology and to psychiatry you should hear with an appointment.  You should go there as soon as possible. Do not drink with Trazodone. Recommend alcohol rehabilitation.    Alcohol Use Education Information about Your Drinking Your score on the Alcohol Use Disorders Identification Test was: AUDIT C:    TOTAL AUDIT SCORE:   .  This score places you in the category of:  Score 0 = Abstainers Score 8-19 = Unhealthy/High Risk Drinkers  Score 1-7 = Low Risk Drinkers Score 20+ = Probable Alcohol Dependence   High Scores (20+) on the Alcohol Use Identification Test Consider becoming involved in a structured program.  You should stop drinking if: . You have tried to cut down before but have not been successful, or  . You suffer from morning shakes during a heavy drinking period, or . You have high blood pressure, or . You are pregnant, or . You have liver disease, or . You are taking medicines that react with alcohol, or . Your alcohol use is affecting your social relationships, or . You have legal consequences like DUIs, or . You call in sick to work, or . You cannot take care of our children, or . Someone close to you says you drink too much    How Much Alcohol is a Drink: Beer: 12 oz. = 1 drink 16 oz. = 1.3 drinks 22 oz. = 2 drinks 40 oz. = 3.3 drinks  Wine: 5 oz. = 1 drink 740 mL (25 oz.) bottle = 5 drinks Malt Liquor: 12 oz. = 1.5 drinks 16 oz. = 2 drinks 22 oz. = 2.5 drinks 40 oz. = 4.5 drinks  80-Proof Spirits - Hard Liquor: 1 shot = 1 drink 1 mixed drink = number of shots Can equal 1-3 drinks   What is Low-risk Drinking? . Have no more than 2 drinks of alcohol per day . Drink no more than 5 days per week . Do not drink alcohol drink alcohol when: - You drive or operate machinery - You are pregnant or breast feeding - You are taking medications that interact with alcohol - You have medical conditions made worse with alcohol - You can  stop or control your drinking      Identify Your Triggers for Drinking . Parties . Particular People . Feeling lonely . Feeling tense . Family problems . Feeling sad . Feeling happy . Feeling bored . After work . Problems sleeping . Criticism . Feelings of failure . After being paid . When others are drinking . In bars . When out for dinner . After arguments . Weekends . Feeling restless . Being in pain   Effects of High-Risk Drinking To the Brain: . Aggressive, irrational behavior . Arguments, violence . Depression, nervousness . Alcohol dependence, memory loss To the Nervous System: . Trembling hands, tingling fingers . Numbness, painful nerves . Impaired sensation leading to falls . Numb tingling toes To Your Lifestyle: . Social, legal, medical problems . Domestic trouble/relationship loss . Job loss & financial problems . Shortened life span . Accidents and death from drunk driving   To the Face: . Premature aging, drinker's nose . Cancer of the throat & mouth To the Body: . Frequent cold . Reduced resistance to infection . Increased risk of pneumonia . Weakness of heart muscle . Heart failure, anemia . Impaired blood clotting . Breast cancer . Vitamin deficiency, bleeding . Severe Inflammation of the stomach . Vomiting, diarrhea, malnutrition . Ulcer, inflammation of the pancreas .  Impaired sexual performance . Birth defects, including deformities, retardation, and low birthweight   Ways to Cope Without Drinking . Go home if you tend to drink after work . Find another activity . Switch to nonalcoholic beverages . Change friends . Join a club . Volunteer . Visit relatives . Plan/take a trip . Go for a walk . Take up a hobby . Listen to music . Talk to a friend . Reading . What would you do if you had no worries about failing?         Good Reasons for Drinking Less . I will live longer - probably 8-10 years. . I will sleep  better. . I will be happier. . I will save a lot of money . My relationships will improve. . I will stay younger for longer. . I will achieve more in my life . There will be a greater chance that I will survive to a healthy old age with no premature damage to my brain.  . I will be better at my job. . I will be less likely to feel depressed and commit suicide (6 times less likely). . I will be less likely to die of heart disease or cancer. . Other people will respect me . I will be less likely to get into trouble with the police. . The possibility that I will die of liver disease will be dramatically reduced (12 times less likely). . It will be less likely that I will die in a car accident (3 times less likely).   Strategies for Cutting Down Keep Track.  Find a way to keep track of how much you drink.  If you make a note of each drink before you drink it, this will help slow you down. Count and Measure.  Know the standard drink sizes.  Ask the bartender or server about the amount of alcohol in a mixed drink. Set Goals.  Decide how many days a week you will drink and how many drinks each day. Pace and Space.  When you do drink, pace yourself.  Have no more than one drink with alcohol per hour.  Alternate "drink spacers" non-alcoholic drinks such as water, soda, or juice with drinks containing alcohol. Include Food.  Don't drink on an empty stomach.  Have some food so the alcohol will be absorbed more slowly into your system.  Avoid Triggers.  Avoid people, places, or activities that have led to drinking in the past.  Certain times of day or feelings may also be triggers.  Make a plan so you will know what you can do instead of drinking. Plan to Handle Urges.  When an urge hits, consider these options:  Remind yourself of your reasons for changing.  Or talk it through with someone you trust. Or get involved with a healthy, distracting activity.  Or, "urge surf" - instead of fighting the feeling,  accept it and ride it out, knowing it will soon crest like a wave and pass. Know Your "No".  Have a polite, convincing "no thanks" for those times when you may be offered a drink and don't want one.  The faster you can say no to these offers, the less likely you are to give in.  If you hesitate, it allows you time to think of excuses to go along.     Psychiatric/Counseling Resources Discussed As Follows:  If Emergency please seek Emergency Room Care Immediately or Call 911.   [facility] Address:  [phone]  [address] [place], [place] [address] Phone: [phone] Website : [email or web address]   [facility] Address:  [address] [place], [place] [address] Phone: [phone] Fax: [phone] Website: [email or web address] How To Access Our Services Because our main goal is to meet the needs of our consumers, [facility] operates on a walk-in basis! To access services, there are just 3 easy steps: 1) Walk in any Monday, Wednesday or Friday between 8:00 am and 3:00 pm and complete our consumer paperwork 2) A Comprehensive Clinical Assessment (CCA) will be completed and appropriate service recommendations will be provided 3) Recommendations are sent to [facility] team members and the appropriate staff will call you within days. Advanced Access Open M - F, 8:00 am - 8:00 pm  Mental health crisis services for all age groups  Triage  Psychiatric Evaluations  Involuntary Commitments  [facility]  Address: [address] [place], [place] [address] Website : [email or web address] Walk in's accepted see web site or call for more information Phone : [phone] Also has [facility] [phone]  Psychology Today Find a therapist by searching online in your area or specialist by your diagnosis Website:  [email or web address]      Duloxetine Delayed-Release Capsules What  is this medicine? DULOXETINE (doo LOX e teen) is used to treat depression, anxiety, and different types of chronic pain. This medicine may be used for other purposes; ask your health care provider or pharmacist if you have questions. COMMON BRAND NAME(S): Cymbalta, Murrell Converserizalma, Irenka What should I tell my health care provider before I take this medicine? They need to know if you have any of these conditions:  bipolar disorder  glaucoma  high blood pressure  kidney disease  liver disease  seizures  suicidal thoughts, plans or attempt; a previous suicide attempt by you or a family member  take medicines that treat or prevent blood clots  taken medicines called MAOIs like Carbex, Eldepryl, Marplan, Nardil, and Parnate within 14 days  trouble passing urine  an unusual reaction to duloxetine, other medicines, foods, dyes, or preservatives  pregnant or trying to get pregnant  breast-feeding How should I use this medicine? Take this medicine by mouth with a glass of water. Follow the directions on the prescription label. Do not crush, cut or chew some capsules of this medicine. Some capsules may be opened and sprinkled on applesauce. Check with your doctor or pharmacist if you are not sure. You can take this medicine with or without food. Take your medicine at regular intervals. Do not take your medicine more often than directed. Do not stop taking this medicine suddenly except upon the advice of your doctor. Stopping this medicine too quickly may cause serious side effects or your condition may worsen. A special MedGuide will be given to you by the pharmacist with each prescription and refill. Be sure to read this information carefully each time. Talk to your pediatrician regarding the use of this medicine in children. While this drug may be prescribed for children as young as 667 years of age for selected conditions, precautions do apply. Overdosage: If you think you have taken too much of  this medicine contact a poison control center or emergency room at once. NOTE: This medicine is only for you. Do not share this medicine with others. What if I miss a dose? If you miss a dose, take it as soon as you can. If it is almost time for your next dose, take only that dose. Do not take double  or extra doses. What may interact with this medicine? Do not take this medicine with any of the following medications:  desvenlafaxine  levomilnacipran  linezolid  MAOIs like Carbex, Eldepryl, Emsam, Marplan, Nardil, and Parnate  methylene blue (injected into a vein)  milnacipran  safinamide  thioridazine  venlafaxine  viloxazine This medicine may also interact with the following medications:  alcohol  amphetamines  aspirin and aspirin-like medicines  certain antibiotics like ciprofloxacin and enoxacin  certain medicines for blood pressure, heart disease, irregular heart beat  certain medicines for depression, anxiety, or psychotic disturbances  certain medicines for migraine headache like almotriptan, eletriptan, frovatriptan, naratriptan, rizatriptan, sumatriptan, zolmitriptan  certain medicines that treat or prevent blood clots like warfarin, enoxaparin, and dalteparin  cimetidine  fentanyl  lithium  NSAIDS, medicines for pain and inflammation, like ibuprofen or naproxen  phentermine  procarbazine  rasagiline  sibutramine  St. John's wort  theophylline  tramadol  tryptophan This list may not describe all possible interactions. Give your health care provider a list of all the medicines, herbs, non-prescription drugs, or dietary supplements you use. Also tell them if you smoke, drink alcohol, or use illegal drugs. Some items may interact with your medicine. What should I watch for while using this medicine? Tell your doctor if your symptoms do not get better or if they get worse. Visit your doctor or healthcare provider for regular checks on your  progress. Because it may take several weeks to see the full effects of this medicine, it is important to continue your treatment as prescribed by your doctor. This medicine may cause serious skin reactions. They can happen weeks to months after starting the medicine. Contact your healthcare provider right away if you notice fevers or flu-like symptoms with a rash. The rash may be red or purple and then turn into blisters or peeling of the skin. Or, you might notice a red rash with swelling of the face, lips, or lymph nodes in your neck or under your arms. Patients and their families should watch out for new or worsening thoughts of suicide or depression. Also watch out for sudden changes in feelings such as feeling anxious, agitated, panicky, irritable, hostile, aggressive, impulsive, severely restless, overly excited and hyperactive, or not being able to sleep. If this happens, especially at the beginning of treatment or after a change in dose, call your healthcare provider. You may get drowsy or dizzy. Do not drive, use machinery, or do anything that needs mental alertness until you know how this medicine affects you. Do not stand or sit up quickly, especially if you are an older patient. This reduces the risk of dizzy or fainting spells. Alcohol may interfere with the effect of this medicine. Avoid alcoholic drinks. This medicine can cause an increase in blood pressure. This medicine can also cause a sudden drop in your blood pressure, which may make you feel faint and increase the chance of a fall. These effects are most common when you first start the medicine or when the dose is increased, or during use of other medicines that can cause a sudden drop in blood pressure. Check with your doctor for instructions on monitoring your blood pressure while taking this medicine. Your mouth may get dry. Chewing sugarless gum or sucking hard candy, and drinking plenty of water, may help. Contact your doctor if the  problem does not go away or is severe. What side effects may I notice from receiving this medicine? Side effects that you should report to  your doctor or health care professional as soon as possible:  allergic reactions like skin rash, itching or hives, swelling of the face, lips, or tongue  anxious  breathing problems  confusion  changes in vision  chest pain  confusion  elevated mood, decreased need for sleep, racing thoughts, impulsive behavior  eye pain  fast, irregular heartbeat  feeling faint or lightheaded, falls  feeling agitated, angry, or irritable  hallucination, loss of contact with reality  high blood pressure  loss of balance or coordination  palpitations  redness, blistering, peeling or loosening of the skin, including inside the mouth  restlessness, pacing, inability to keep still  seizures  stiff muscles  suicidal thoughts or other mood changes  trouble passing urine or change in the amount of urine  trouble sleeping  unusual bleeding or bruising  unusually weak or tired  vomiting  yellowing of the eyes or skin Side effects that usually do not require medical attention (report to your doctor or health care professional if they continue or are bothersome):  change in sex drive or performance  change in appetite or weight  constipation  dizziness  dry mouth  headache  increased sweating  nausea  tired This list may not describe all possible side effects. Call your doctor for medical advice about side effects. You may report side effects to FDA at 1-800-FDA-1088. Where should I keep my medicine? Keep out of the reach of children and pets. Store at room temperature between 15 and 30 degrees C (59 to 86 degrees F). Get rid of any unused medicine after the expiration date. To get rid of medicines that are no longer needed or have expired:  Take the medicine to a medicine take-back program. Check with your pharmacy or law  enforcement to find a location.  If you cannot return the medicine, check the label or package insert to see if the medicine should be thrown out in the garbage or flushed down the toilet. If you are not sure, ask your health care provider. If it is safe to put it in the trash, take the medicine out of the container. Mix the medicine with cat litter, dirt, coffee grounds, or other unwanted substance. Seal the mixture in a bag or container. Put it in the trash. NOTE: This sheet is a summary. It may not cover all possible information. If you have questions about this medicine, talk to your doctor, pharmacist, or health care provider.  2021 Elsevier/Gold Standard (2020-10-11 16:06:16) Hydrochlorothiazide Capsules or Tablets What is this medicine? HYDROCHLOROTHIAZIDE (hye droe klor oh THYE a zide) is a diuretic. It helps you make more urine and to lose salt and excess water from your body. It treats swelling from heart, kidney, or liver disease. It also treats high blood pressure. This medicine may be used for other purposes; ask your health care provider or pharmacist if you have questions. COMMON BRAND NAME(S): Esidrix, Ezide, HydroDIURIL, Microzide, Oretic, Zide What should I tell my health care provider before I take this medicine? They need to know if you have any of these conditions:  diabetes  gout  kidney disease  liver disease  lupus  pancreatitis  an unusual or allergic reaction to hydrochlorothiazide, sulfa drugs, other medicines, foods, dyes, or preservatives  pregnant or trying to get pregnant  breast-feeding How should I use this medicine? Take this medicine by mouth. Take it as directed on the prescription label at the same time every day. You can take it with or without  food. If it upsets your stomach, take it with food. Keep taking it unless your health care provider tells you to stop. Talk to your health care provider about the use of this medicine in children. While it  may be prescribed for children as young as newborns for selected conditions, precautions do apply. Overdosage: If you think you have taken too much of this medicine contact a poison control center or emergency room at once. NOTE: This medicine is only for you. Do not share this medicine with others. What if I miss a dose? If you miss a dose, take it as soon as you can. If it is almost time for your next dose, take only that dose. Do not take double or extra doses. What may interact with this medicine?  cholestyramine  colestipol  digoxin  dofetilide  lithium  medicines for blood pressure  medicines for diabetes  medicines that relax muscles for surgery  other diuretics  steroid medicines like prednisone or cortisone This list may not describe all possible interactions. Give your health care provider a list of all the medicines, herbs, non-prescription drugs, or dietary supplements you use. Also tell them if you smoke, drink alcohol, or use illegal drugs. Some items may interact with your medicine. What should I watch for while using this medicine? Visit your health care provider for regular check ups. Check your blood pressure as directed. Ask your health care provider what your blood pressure should be. Also, find out when you should contact him or her. Do not treat yourself for coughs, colds, or pain while you are using this medicine without asking your health care provider for advice. Some medicines may increase your blood pressure. You may get drowsy or dizzy. Do not drive, use machinery, or do anything that needs mental alertness until you know how this medicine affects you. Do not stand or sit up quickly, especially if you are an older patient. This reduces the risk of dizzy or fainting spells. Alcohol can make you more drowsy and dizzy. Avoid alcoholic drinks. Talk to your health care professional about your risk of skin cancer. You may be more at risk for skin cancer if you  take this medicine. This medicine can make you more sensitive to the sun. Keep out of the sun. If you cannot avoid being in the sun, wear protective clothing and use sunscreen. Do not use sun lamps or tanning beds/booths. You may need to be on a special diet while taking this medicine. Ask your health care provider. Also, find out how many glasses of fluids you need to drink each day. Check with your health care provider if you get an attack of severe diarrhea, nausea and vomiting, or if you sweat a lot. The loss of too much body fluid can make it dangerous for you to take this medicine. This medicine may increase blood sugar. Ask your healthcare provider if changes in diet or medicines are needed if you have diabetes. What side effects may I notice from receiving this medicine? Side effects that you should report to your doctor or health care professional as soon as possible:  allergic reactions (skin rash, itching or hives; swelling of the face, lips, or tongue)  gout (severe pain, redness, or swelling in joints like the big toe)  high blood sugar (increased hunger, thirst or urination; unusually weak or tired; blurry vision)  kidney injury (trouble passing urine or change in the amount of urine)  low blood pressure (dizziness; feeling faint or  lightheaded, falls; unusually weak or tired)  low potassium levels (trouble breathing; chest pain; dizziness; fast, irregular heartbeat; feeling faint or lightheaded, falls; muscle cramps or pain)  sudden change in vision or eye pain Side effects that usually do not require medical attention (report to your doctor or health care professional if they continue or are bothersome):  change in sex drive or performance  dry mouth  headache  stomach upset This list may not describe all possible side effects. Call your doctor for medical advice about side effects. You may report side effects to FDA at 1-800-FDA-1088. Where should I keep my  medicine? Keep out of the reach of children and pets. Store at room temperature between 20 and 25 degrees C (68 and 77 degrees F). Protect from light and moisture. Keep the container tightly closed. Do not freeze. Get rid of any unused medicine after the expiration date. To get rid of medicines that are no longer needed or have expired:  Take the medicine to a medicine take-back program. Check with your pharmacy or law enforcement to find a location.  If you cannot return the medicine, check the label or package insert to see if the medicine should be thrown out in the garbage or flushed down the toilet. If you are not sure, ask your health care provider. If it is safe to put in the trash, empty the medicine out of the container. Mix the medicine with cat litter, dirt, coffee grounds, or other unwanted substance. Seal the mixture in a bag or container. Put it in the trash. NOTE: This sheet is a summary. It may not cover all possible information. If you have questions about this medicine, talk to your doctor, pharmacist, or health care provider.  2021 Elsevier/Gold Standard (2020-10-03 17:16:00)

## 2021-02-12 NOTE — Progress Notes (Signed)
Established patient visit   Patient: Katrina Turner   DOB: Feb 24, 1981   40 y.o. Female  MRN: [id number] Visit Date: 02/12/2021  Today's healthcare provider: Jairo Ben, FNP   Chief Complaint  Patient presents with  . Follow-up   Subjective    HPI HPI    Patient presents in office today to go over chronic health issues. Patient would like to discuss need for referral to neurology and G.I. Patient states that she has been suffering with difficulty concentrating and states that she would like to start Adderall. Patient reports that she has noticed "lumps" around her mesh on her abdomen and states that it moves when pressed on.    Last edited by Fonda Kinder, CMA on 02/12/2021  4:03 PM. (History)      History of alcohol abuse. She was admitted in august and give blood transfusion due to GI bleed and alcohol intoxications. She was also in the hospital for psychosis.  She was previously sent to psychiatry, seen neurology as well as referred to gastroenterology and did not go.  Patient  denies any fever, body aches,chills, rash, chest pain, shortness of breath, nausea, vomiting, or diarrhea.  Denies dizziness, lightheadedness, pre syncopal or syncopal episodes.      Medications: Outpatient Medications Prior to Visit  Medication Sig  . fluticasone (FLONASE) 50 MCG/ACT nasal spray Place 1 spray into both nostrils daily. (Patient not taking: No sig reported)  . [DISCONTINUED] albuterol (VENTOLIN HFA) 108 (90 Base) MCG/ACT inhaler Inhale 1-2 puffs into the lungs every 6 (six) hours as needed for wheezing or shortness of breath (excercise induced asthma she 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 (Patient not taking: Reported on 02/12/2021)  . [DISCONTINUED] benztropine (COGENTIN) 1 MG tablet Take 0.5 tablets (0.5 mg total) by mouth 2 (two) times daily.  . [DISCONTINUED] cetirizine (ZYRTEC ALLERGY) 10 MG tablet Take 1 tablet (10 mg total) by mouth daily. (Patient not taking: No sig reported)  .  [DISCONTINUED] DULoxetine (CYMBALTA) 20 MG capsule Take 1 capsule (20 mg total) by mouth daily.  . [DISCONTINUED] haloperidol (HALDOL) 2 MG tablet Take 1 tablet (2 mg total) by mouth at bedtime.  . [DISCONTINUED] nortriptyline (PAMELOR) 10 MG capsule Take 10 mg by mouth 2 (two) times daily. (Patient not taking: No sig reported)  . [DISCONTINUED] OLANZapine (ZYPREXA) 10 MG tablet Take 1 tablet (10 mg total) by mouth at bedtime.   No facility-administered medications prior to visit.    Review of Systems  Constitutional: Positive for fatigue.  Eyes: Negative.   Respiratory: Negative.   Cardiovascular: Negative.   Gastrointestinal: Positive for abdominal pain. Negative for abdominal distention, anal bleeding, blood in stool, constipation, diarrhea, nausea and rectal pain.  Genitourinary: Negative.   Musculoskeletal: Positive for arthralgias.  Psychiatric/Behavioral: Positive for agitation and decreased concentration. Negative for confusion, self-injury, sleep disturbance and suicidal ideas. The patient is nervous/anxious and is hyperactive.        Objective    BP (!) 121/92   Pulse (!) 101   Temp 97.7 F (36.5 C) (Oral)   Resp 16   Wt 159 lb 3.2 oz (72.2 kg)   SpO2 100%   BMI 24.93 kg/m     Physical Exam Vitals reviewed.  Constitutional:      General: She is not in acute distress.    Appearance: She is well-developed. She is not diaphoretic.     Interventions: She is not intubated.    Comments: Very disheveled in a appearance accompanied with  husband she seems intoxicated.   HENT:     Head: Normocephalic and atraumatic.     Right Ear: External ear normal.     Left Ear: External ear normal.     Nose: Nose normal.     Mouth/Throat:     Pharynx: No oropharyngeal exudate.  Eyes:     General: Lids are normal. No scleral icterus.       Right eye: No discharge.        Left eye: No discharge.     Conjunctiva/sclera: Conjunctivae normal.     Right eye: Right conjunctiva is not  injected. No exudate or hemorrhage.    Left eye: Left conjunctiva is not injected. No exudate or hemorrhage.    Pupils: Pupils are equal, round, and reactive to light.  Neck:     Thyroid: No thyroid mass or thyromegaly.     Vascular: Normal carotid pulses. No carotid bruit, hepatojugular reflux or JVD.     Trachea: Trachea and phonation normal. No tracheal tenderness or tracheal deviation.     Meningeal: Brudzinski's sign and Kernig's sign absent.  Cardiovascular:     Rate and Rhythm: Normal rate and regular rhythm.     Pulses: Normal pulses.          Radial pulses are 2+ on the right side and 2+ on the left side.       Dorsalis pedis pulses are 2+ on the right side and 2+ on the left side.       Posterior tibial pulses are 2+ on the right side and 2+ on the left side.     Heart sounds: Normal heart sounds, S1 normal and S2 normal. Heart sounds not distant. No murmur heard. No friction rub. No gallop.   Pulmonary:     Effort: Pulmonary effort is normal. No tachypnea, bradypnea, accessory muscle usage or respiratory distress. She is not intubated.     Breath sounds: Normal breath sounds. No stridor. No wheezing, rhonchi or rales.  Chest:     Chest wall: No tenderness.  Breasts:     Right: No supraclavicular adenopathy.     Left: No supraclavicular adenopathy.    Abdominal:     General: Bowel sounds are normal. There is no distension or abdominal bruit.     Palpations: Abdomen is soft. There is no shifting dullness, fluid wave, hepatomegaly, splenomegaly, mass or pulsatile mass.     Tenderness: There is abdominal tenderness (bilateral lower quadrants chronic has mesh ). There is no right CVA tenderness, left CVA tenderness, guarding or rebound.     Hernia: No hernia is present.  Musculoskeletal:        General: No tenderness or deformity. Normal range of motion.     Cervical back: Full passive range of motion without pain, normal range of motion and neck supple. No edema, erythema or  rigidity. No spinous process tenderness or muscular tenderness. Normal range of motion.  Lymphadenopathy:     Head:     Right side of head: No submental, submandibular, tonsillar, preauricular, posterior auricular or occipital adenopathy.     Left side of head: No submental, submandibular, tonsillar, preauricular, posterior auricular or occipital adenopathy.     Cervical: No cervical adenopathy.     Right cervical: No superficial, deep or posterior cervical adenopathy.    Left cervical: No superficial, deep or posterior cervical adenopathy.     Upper Body:     Right upper body: No supraclavicular or pectoral adenopathy.  Left upper body: No supraclavicular or pectoral adenopathy.  Skin:    General: Skin is warm and dry.     Coloration: Skin is not pale.     Findings: No abrasion, bruising, burn, ecchymosis, erythema, lesion, petechiae or rash.     Nails: There is no clubbing.  Neurological:     Mental Status: She is alert and oriented to person, place, and time.     GCS: GCS eye subscore is 4. GCS verbal subscore is 5. GCS motor subscore is 6.     Cranial Nerves: No cranial nerve deficit.     Sensory: No sensory deficit.     Motor: No weakness, tremor, atrophy, abnormal muscle tone or seizure activity.     Coordination: Coordination normal.     Gait: Gait normal.     Deep Tendon Reflexes: Reflexes are normal and symmetric. Reflexes normal. Babinski sign absent on the right side. Babinski sign absent on the left side.     Reflex Scores:      Tricep reflexes are 2+ on the right side and 2+ on the left side.      Bicep reflexes are 2+ on the right side and 2+ on the left side.      Brachioradialis reflexes are 2+ on the right side and 2+ on the left side.      Patellar reflexes are 2+ on the right side and 2+ on the left side.      Achilles reflexes are 2+ on the right side and 2+ on the left side. Psychiatric:        Mood and Affect: Mood normal.        Speech: Speech normal.         Behavior: Behavior normal.        Thought Content: Thought content normal.        Judgment: Judgment normal.     Results for orders placed or performed in visit on 02/12/21  Drug Screen 12+Alcohol+CRT, Ur  Result Value Ref Range   Ethanol, Urine WILL FOLLOW    Amphetamines, Urine WILL FOLLOW    Barbiturate WILL FOLLOW    BENZODIAZ UR QL WILL FOLLOW    Cannabinoids WILL FOLLOW    Cocaine (Metabolite) WILL FOLLOW    OPIATE SCREEN URINE WILL FOLLOW    Oxycodone/Oxymorphone, Urine WILL FOLLOW    Phencyclidine WILL FOLLOW    Methadone WILL FOLLOW    Propoxyphene WILL FOLLOW    Meperidine WILL FOLLOW    Tramadol WILL FOLLOW    Creatinine, Urine WILL FOLLOW   Specimen status report  Result Value Ref Range   specimen status report Comment     Assessment & Plan     Personality disorder (HCC) - Plan: CBC with Differential/Platelet, Comprehensive Metabolic Panel (CMET), TSH, Lipid Panel w/o Chol/HDL Ratio, Ambulatory referral to Psychiatry, DULoxetine (CYMBALTA) 20 MG capsule, DISCONTINUED: DULoxetine (CYMBALTA) 20 MG capsule, DISCONTINUED: DULoxetine (CYMBALTA) 20 MG capsule  Essential hypertension  History of Roux-en-Y gastric bypass - Plan: Ambulatory referral to Gastroenterology  H/O gastric bypass - Plan: Ambulatory referral to Gastroenterology  Bipolar 1 disorder (HCC) - Plan: Ambulatory referral to Psychiatry, DULoxetine (CYMBALTA) 20 MG capsule, DISCONTINUED: DULoxetine (CYMBALTA) 20 MG capsule, DISCONTINUED: DULoxetine (CYMBALTA) 20 MG capsule  Alcohol-induced chronic pancreatitis (HCC) - Plan: Ambulatory referral to Gastroenterology, Amylase, Lipase, Drug Screen 12+Alcohol+CRT, Ur, Ethanol, Magnesium  History of gastrointestinal bleeding - Plan: Ambulatory referral to Gastroenterology, Amylase, Lipase  Insomnia, unspecified type - Plan: traZODone (DESYREL) 50 MG tablet,  DISCONTINUED: traZODone (DESYREL) 50 MG tablet, DISCONTINUED: traZODone (DESYREL) 50 MG  tablet  History of asthma - Plan: albuterol (VENTOLIN HFA) 108 (90 Base) MCG/ACT inhaler, cetirizine (ZYRTEC ALLERGY) 10 MG tablet  Alcohol abuse - Plan: Ambulatory referral to Neurology  Chronic migraine without aura without status migrainosus, not intractable - Plan: Ambulatory referral to Neurology  Lupus [facility]) - Plan: Ambulatory referral to Rheumatology  Tobacco use disorder, continuous - Plan: Ambulatory referral to Smoking Cessation Program  Alcoholic intoxication without complication (HCC)  Meds ordered this encounter  Medications  . albuterol (VENTOLIN HFA) 108 (90 Base) MCG/ACT inhaler    Sig: Inhale 1-2 puffs into the lungs every 6 (six) hours as needed for wheezing or shortness of breath (excercise induced asthma she 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    Dispense:  18 g    Refill:  1  . cetirizine (ZYRTEC ALLERGY) 10 MG tablet    Sig: Take 1 tablet (10 mg total) by mouth daily.    Dispense:  30 tablet    Refill:  0  . DISCONTD: DULoxetine (CYMBALTA) 20 MG capsule    Sig: Take 1 capsule (20 mg total) by mouth daily.    Dispense:  30 capsule    Refill:  0  . DISCONTD: traZODone (DESYREL) 50 MG tablet    Sig: Take 0.5 tablets (25 mg total) by mouth at bedtime.    Dispense:  15 tablet    Refill:  0  . hydrochlorothiazide (HYDRODIURIL) 25 MG tablet    Sig: Take 0.5 tablets (12.5 mg total) by mouth daily.    Dispense:  30 tablet    Refill:  0  . DISCONTD: traZODone (DESYREL) 50 MG tablet    Sig: Take 0.5 tablets (25 mg total) by mouth at bedtime. Not to take with any alcohol    Dispense:  15 tablet    Refill:  0  . DISCONTD: DULoxetine (CYMBALTA) 20 MG capsule    Sig: Take 1 capsule (20 mg total) by mouth daily.    Dispense:  30 capsule    Refill:  0  . DULoxetine (CYMBALTA) 20 MG capsule    Sig: Take 1 capsule (20 mg total) by mouth daily.    Dispense:  30 capsule    Refill:  0  . traZODone (DESYREL) 50 MG tablet    Sig: Take 0.5 tablets (25 mg total) by mouth at bedtime.     Dispense:  15 tablet    Refill:  0  she is encouraged to see gastroenterology, neurology as well as psychiatry as soon as possible I have discussed with her and her husband with the heavy alcohol abuse it is not appropriate and I do not feel comfortable continuing to treat her psychiatry issues.  She has been referred  Will refill Cymbalta and Trazodone at lower dose, not to take with alcohol or any other CBS depressant. Once she sees Psychiatry will have them manage.  . She is due for labs advised to return fasting. Urine drug test today.    Alcohol Use Education Information about Your Drinking Your score on the Alcohol Use Disorders Identification Test was: AUDIT C:    TOTAL AUDIT SCORE:   .  This score places you in the category of:  Score 0 = Abstainers Score 8-19 = Unhealthy/High Risk Drinkers  Score 1-7 = Low Risk Drinkers Score 20+ = Probable Alcohol Dependence   High Scores (20+) on the Alcohol Use Identification Test Consider becoming involved in a structured program.  You  should stop drinking if: . You have tried to cut down before but have not been successful, or  . You suffer from morning shakes during a heavy drinking period, or . You have high blood pressure, or . You are pregnant, or . You have liver disease, or . You are taking medicines that react with alcohol, or . Your alcohol use is affecting your social relationships, or . You have legal consequences like DUIs, or . You call in sick to work, or . You cannot take care of our children, or . Someone close to you says you drink too much    How Much Alcohol is a Drink: Beer: 12 oz. = 1 drink 16 oz. = 1.3 drinks 22 oz. = 2 drinks 40 oz. = 3.3 drinks  Wine: 5 oz. = 1 drink 740 mL (25 oz.) bottle = 5 drinks Malt Liquor: 12 oz. = 1.5 drinks 16 oz. = 2 drinks 22 oz. = 2.5 drinks 40 oz. = 4.5 drinks  80-Proof Spirits - Hard Liquor: 1 shot = 1 drink 1 mixed drink = number of shots Can equal 1-3 drinks    What is Low-risk Drinking? . Have no more than 2 drinks of alcohol per day . Drink no more than 5 days per week . Do not drink alcohol drink alcohol when: - You drive or operate machinery - You are pregnant or breast feeding - You are taking medications that interact with alcohol - You have medical conditions made worse with alcohol - You can stop or control your drinking      Identify Your Triggers for Drinking . Parties . Particular People . Feeling lonely . Feeling tense . Family problems . Feeling sad . Feeling happy . Feeling bored . After work . Problems sleeping . Criticism . Feelings of failure . After being paid . When others are drinking . In bars . When out for dinner . After arguments . Weekends . Feeling restless . Being in pain   Effects of High-Risk Drinking To the Brain: . Aggressive, irrational behavior . Arguments, violence . Depression, nervousness . Alcohol dependence, memory loss To the Nervous System: . Trembling hands, tingling fingers . Numbness, painful nerves . Impaired sensation leading to falls . Numb tingling toes To Your Lifestyle: . Social, legal, medical problems . Domestic trouble/relationship loss . Job loss & financial problems . Shortened life span . Accidents and death from drunk driving   To the Face: . Premature aging, drinker's nose . Cancer of the throat & mouth To the Body: . Frequent cold . Reduced resistance to infection . Increased risk of pneumonia . Weakness of heart muscle . Heart failure, anemia . Impaired blood clotting . Breast cancer . Vitamin deficiency, bleeding . Severe Inflammation of the stomach . Vomiting, diarrhea, malnutrition . Ulcer, inflammation of the pancreas . Impaired sexual performance . Birth defects, including deformities, retardation, and low birthweight   Ways to Cope Without Drinking . Go home if you tend to drink after work . Find another activity . Switch to  nonalcoholic beverages . Change friends . Join a club . Volunteer . Visit relatives . Plan/take a trip . Go for a walk . Take up a hobby . Listen to music . Talk to a friend . Reading . What would you do if you had no worries about failing?         Good Reasons for Drinking Less . I will live longer - probably 8-10 years. . I will  sleep better. . I will be happier. . I will save a lot of money . My relationships will improve. . I will stay younger for longer. . I will achieve more in my life . There will be a greater chance that I will survive to a healthy old age with no premature damage to my brain.  . I will be better at my job. . I will be less likely to feel depressed and commit suicide (6 times less likely). . I will be less likely to die of heart disease or cancer. . Other people will respect me . I will be less likely to get into trouble with the police. . The possibility that I will die of liver disease will be dramatically reduced (12 times less likely). . It will be less likely that I will die in a car accident (3 times less likely).   Strategies for Cutting Down Keep Track.  Find a way to keep track of how much you drink.  If you make a note of each drink before you drink it, this will help slow you down. Count and Measure.  Know the standard drink sizes.  Ask the bartender or server about the amount of alcohol in a mixed drink. Set Goals.  Decide how many days a week you will drink and how many drinks each day. Pace and Space.  When you do drink, pace yourself.  Have no more than one drink with alcohol per hour.  Alternate "drink spacers" non-alcoholic drinks such as water, soda, or juice with drinks containing alcohol. Include Food.  Don't drink on an empty stomach.  Have some food so the alcohol will be absorbed more slowly into your system.  Avoid Triggers.  Avoid people, places, or activities that have led to drinking in the past.  Certain times of day or  feelings may also be triggers.  Make a plan so you will know what you can do instead of drinking. Plan to Handle Urges.  When an urge hits, consider these options:  Remind yourself of your reasons for changing.  Or talk it through with someone you trust. Or get involved with a healthy, distracting activity.  Or, "urge surf" - instead of fighting the feeling, accept it and ride it out, knowing it will soon crest like a wave and pass. Know Your "No".  Have a polite, convincing "no thanks" for those times when you may be offered a drink and don't want one.  The faster you can say no to these offers, the less likely you are to give in.  If you hesitate, it allows you time to think of excuses to go along.   Alcohol cessation advised.   Return in about 3 weeks (around 03/05/2021), or if symptoms worsen or fail to improve, for at any time for any worsening symptoms, Go to Emergency room/ urgent care if worse.     The entirety of the information documented in the History of Present Illness, Review of Systems and Physical Exam were personally obtained by me. Portions of this information were initially documented by the CMA and reviewed by me for thoroughness and accuracy.     Jairo Ben, FNP  [facility] [phone] (phone) [phone] (fax)  [facility]

## 2021-02-13 ENCOUNTER — Encounter: Payer: Self-pay | Admitting: *Deleted

## 2021-02-16 ENCOUNTER — Encounter: Payer: Self-pay | Admitting: Adult Health

## 2021-02-16 DIAGNOSIS — F10920 Alcohol use, unspecified with intoxication, uncomplicated: Secondary | ICD-10-CM | POA: Insufficient documentation

## 2021-02-16 DIAGNOSIS — F10129 Alcohol abuse with intoxication, unspecified: Secondary | ICD-10-CM | POA: Insufficient documentation

## 2021-02-17 LAB — DRUG SCREEN 12+ALCOHOL+CRT, UR
Amphetamines, Urine: NEGATIVE ng/mL
BENZODIAZ UR QL: POSITIVE ng/mL — AB
Barbiturate: NEGATIVE ng/mL
Cannabinoids: NEGATIVE ng/mL
Cocaine (Metabolite): NEGATIVE ng/mL
Creatinine, Urine: 55.3 mg/dL (ref 20.0–300.0)
Ethanol, Urine: POSITIVE — AB
Meperidine: NEGATIVE ng/mL
Methadone: NEGATIVE ng/mL
OPIATE SCREEN URINE: NEGATIVE ng/mL
Oxycodone/Oxymorphone, Urine: NEGATIVE ng/mL
Phencyclidine: NEGATIVE ng/mL
Propoxyphene: NEGATIVE ng/mL
Tramadol: NEGATIVE ng/mL

## 2021-02-17 LAB — SPECIMEN STATUS REPORT

## 2021-02-17 NOTE — Progress Notes (Signed)
Positive for alcohol in urine.  Positive for benzodiazepines not prescribed by this provider or on medication list. Needs to schedule with psychiatry for refill and medication psychiatry management as discussed at recent office visit.

## 2021-02-21 ENCOUNTER — Emergency Department
Admission: EM | Admit: 2021-02-21 | Discharge: 2021-02-22 | Disposition: A | Discharge status: Home / Self Care | Payer: BLUE CROSS/BLUE SHIELD | Attending: Emergency Medicine | Admitting: Emergency Medicine

## 2021-02-21 ENCOUNTER — Other Ambulatory Visit: Payer: Self-pay

## 2021-02-21 DIAGNOSIS — Z79899 Other long term (current) drug therapy: Secondary | ICD-10-CM | POA: Diagnosis not present

## 2021-02-21 DIAGNOSIS — Y908 Blood alcohol level of 240 mg/100 ml or more: Secondary | ICD-10-CM | POA: Diagnosis not present

## 2021-02-21 DIAGNOSIS — Z20822 Contact with and (suspected) exposure to covid-19: Secondary | ICD-10-CM | POA: Insufficient documentation

## 2021-02-21 DIAGNOSIS — F17210 Nicotine dependence, cigarettes, uncomplicated: Secondary | ICD-10-CM | POA: Diagnosis not present

## 2021-02-21 DIAGNOSIS — F1029 Alcohol dependence with unspecified alcohol-induced disorder: Secondary | ICD-10-CM | POA: Insufficient documentation

## 2021-02-21 DIAGNOSIS — I10 Essential (primary) hypertension: Secondary | ICD-10-CM | POA: Diagnosis not present

## 2021-02-21 DIAGNOSIS — R451 Restlessness and agitation: Secondary | ICD-10-CM

## 2021-02-21 LAB — CBC WITH DIFFERENTIAL/PLATELET
Abs Immature Granulocytes: 0.02 10*3/uL (ref 0.00–0.07)
Basophils Absolute: 0.1 10*3/uL (ref 0.0–0.1)
Basophils Relative: 2 %
Eosinophils Absolute: 0 10*3/uL (ref 0.0–0.5)
Eosinophils Relative: 0 %
HCT: 41.2 % (ref 36.0–46.0)
Hemoglobin: 13.5 g/dL (ref 12.0–15.0)
Immature Granulocytes: 0 %
Lymphocytes Relative: 44 %
Lymphs Abs: 3.1 10*3/uL (ref 0.7–4.0)
MCH: 28.9 pg (ref 26.0–34.0)
MCHC: 32.8 g/dL (ref 30.0–36.0)
MCV: 88.2 fL (ref 80.0–100.0)
Monocytes Absolute: 0.6 10*3/uL (ref 0.1–1.0)
Monocytes Relative: 9 %
Neutro Abs: 3.2 10*3/uL (ref 1.7–7.7)
Neutrophils Relative %: 45 %
Platelets: 382 10*3/uL (ref 150–400)
RBC: 4.67 MIL/uL (ref 3.87–5.11)
RDW: 17.2 % — ABNORMAL HIGH (ref 11.5–15.5)
WBC: 7.1 10*3/uL (ref 4.0–10.5)
nRBC: 0 % (ref 0.0–0.2)

## 2021-02-21 LAB — COMPREHENSIVE METABOLIC PANEL
ALT: 59 U/L — ABNORMAL HIGH (ref 0–44)
AST: 139 U/L — ABNORMAL HIGH (ref 15–41)
Albumin: 3.6 g/dL (ref 3.5–5.0)
Alkaline Phosphatase: 137 U/L — ABNORMAL HIGH (ref 38–126)
Anion gap: 14 (ref 5–15)
BUN: 18 mg/dL (ref 6–20)
CO2: 23 mmol/L (ref 22–32)
Calcium: 8.6 mg/dL — ABNORMAL LOW (ref 8.9–10.3)
Chloride: 101 mmol/L (ref 98–111)
Creatinine, Ser: 0.66 mg/dL (ref 0.44–1.00)
GFR, Estimated: >60 mL/min (ref >60)
Glucose, Bld: 83 mg/dL (ref 70–99)
Potassium: 3.9 mmol/L (ref 3.5–5.1)
Sodium: 138 mmol/L (ref 135–145)
Total Bilirubin: 1 mg/dL (ref 0.3–1.2)
Total Protein: 7.7 g/dL (ref 6.5–8.1)

## 2021-02-21 LAB — URINALYSIS, ROUTINE W REFLEX MICROSCOPIC
Bilirubin Urine: NEGATIVE
Glucose, UA: NEGATIVE mg/dL
Hgb urine dipstick: NEGATIVE
Ketones, ur: NEGATIVE mg/dL
Leukocytes,Ua: NEGATIVE
Nitrite: POSITIVE — AB
Protein, ur: NEGATIVE mg/dL
Specific Gravity, Urine: 1.014 (ref 1.005–1.030)
pH: 5 (ref 5.0–8.0)

## 2021-02-21 LAB — ETHANOL: Alcohol, Ethyl (B): 402 mg/dL (ref <10)

## 2021-02-21 MED ORDER — LORAZEPAM 2 MG/ML IJ SOLN: 0.0000 mg | Freq: Two times a day (BID) | INTRAMUSCULAR | Status: DC

## 2021-02-21 MED ORDER — THIAMINE HCL 100 MG/ML IJ SOLN: 100.0000 mg | Freq: Every day | INTRAMUSCULAR | Status: DC

## 2021-02-21 MED ORDER — THIAMINE HCL 100 MG PO TABS
100.0000 mg | ORAL_TABLET | Freq: Every day | ORAL | Status: DC
  Administered 2021-02-22: 100 mg via ORAL
  Filled 2021-02-21: qty 1

## 2021-02-21 MED ORDER — DIPHENHYDRAMINE HCL 25 MG PO CAPS
50.0000 mg | ORAL_CAPSULE | Freq: Once | ORAL | Status: DC
  Filled 2021-02-21: qty 2

## 2021-02-21 MED ORDER — OLANZAPINE 5 MG PO TBDP
5.0000 mg | ORAL_TABLET | Freq: Once | ORAL | Status: AC
  Administered 2021-02-21: 5 mg via ORAL
  Filled 2021-02-21: qty 1

## 2021-02-21 MED ORDER — OLANZAPINE 5 MG PO TBDP
5.0000 mg | ORAL_TABLET | Freq: Every day | ORAL | Status: DC
  Filled 2021-02-21: qty 1

## 2021-02-21 MED ORDER — LORAZEPAM 2 MG PO TABS: 0.0000 mg | ORAL_TABLET | Freq: Two times a day (BID) | ORAL | Status: DC

## 2021-02-21 MED ORDER — LORAZEPAM 2 MG PO TABS
0.0000 mg | ORAL_TABLET | Freq: Four times a day (QID) | ORAL | Status: DC
  Administered 2021-02-22 (×3): 2 mg via ORAL
  Filled 2021-02-21 (×3): qty 1

## 2021-02-21 MED ORDER — ACETAMINOPHEN 325 MG PO TABS
650.0000 mg | ORAL_TABLET | Freq: Once | ORAL | Status: AC
  Administered 2021-02-21: 650 mg via ORAL
  Filled 2021-02-21: qty 2

## 2021-02-21 MED ORDER — LORAZEPAM 2 MG/ML IJ SOLN: 0.0000 mg | Freq: Four times a day (QID) | INTRAMUSCULAR | Status: DC

## 2021-02-21 NOTE — ED Notes (Signed)
Pt given warm blanket at this time 

## 2021-02-21 NOTE — ED Triage Notes (Signed)
Pt requesting evaluation for detox. Presents intoxicated and reports ingesting 15 beers today. Reports drinks 1 case beer/day for the past 3 years. Patient denies SI or HI. 

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
## 2021-02-21 NOTE — ED Notes (Signed)
Pt reminded to stop screaming in hallway at this time, as there are other patients around. Pt reminded that she will be seen by Dr. Erma Heritage shortly.

## 2021-02-21 NOTE — ED Notes (Signed)
Pt ambulatory to bathroom and back with husband.

## 2021-02-22 LAB — RESP PANEL BY RT-PCR (FLU A&B, COVID) ARPGX2
Influenza A by PCR: NEGATIVE
Influenza B by PCR: NEGATIVE
SARS Coronavirus 2 by RT PCR: NEGATIVE

## 2021-02-22 LAB — URINE DRUG SCREEN, QUALITATIVE (ARMC ONLY)
Amphetamines, Ur Screen: NOT DETECTED
Barbiturates, Ur Screen: NOT DETECTED
Benzodiazepine, Ur Scrn: POSITIVE — AB
Cannabinoid 50 Ng, Ur ~~LOC~~: NOT DETECTED
Cocaine Metabolite,Ur ~~LOC~~: NOT DETECTED
MDMA (Ecstasy)Ur Screen: NOT DETECTED
Methadone Scn, Ur: NOT DETECTED
Opiate, Ur Screen: NOT DETECTED
Phencyclidine (PCP) Ur S: NOT DETECTED
Tricyclic, Ur Screen: NOT DETECTED

## 2021-02-22 LAB — PREGNANCY, URINE: Preg Test, Ur: NEGATIVE

## 2021-02-22 MED ORDER — ONDANSETRON 4 MG PO TBDP
4.0000 mg | ORAL_TABLET | Freq: Once | ORAL | Status: AC
  Administered 2021-02-22: 4 mg via ORAL
  Filled 2021-02-22: qty 1

## 2021-02-22 MED ORDER — DIPHENHYDRAMINE HCL 25 MG PO CAPS
50.0000 mg | ORAL_CAPSULE | Freq: Once | ORAL | Status: AC
  Administered 2021-02-22: 50 mg via ORAL

## 2021-02-22 MED ORDER — ONDANSETRON 4 MG PO TBDP
ORAL_TABLET | ORAL | Status: AC
  Filled 2021-02-22: qty 1

## 2021-02-22 MED ORDER — OLANZAPINE 5 MG PO TBDP
5.0000 mg | ORAL_TABLET | Freq: Once | ORAL | Status: AC
  Administered 2021-02-22: 5 mg via ORAL

## 2021-02-22 MED ORDER — ONDANSETRON 4 MG PO TBDP
4.0000 mg | ORAL_TABLET | Freq: Once | ORAL | Status: AC
  Administered 2021-02-22: 4 mg via ORAL

## 2021-02-22 NOTE — ED Notes (Signed)
Pt very unsteady on her feet at this point. Pt requesting to go to the bathroom. Caitlyn, NT assisted pt to the bathroom at this time.

## 2021-02-22 NOTE — ED Notes (Signed)
Pt is pending discharge at this time. Instructed pt to call husband to come pick her up. Pt states the phone went to voicemail.

## 2021-02-22 NOTE — ED Notes (Signed)
Dr. Clapacs, MD and Calvin, TTS at bedside at this time for assessment.  

## 2021-02-22 NOTE — Progress Notes (Signed)
Katrina Turner is a 40 y.o. female  with ho bipolar disorder, alcohol dependence here with desire for detox. Has been admitted before for this. The psychiatry team attempted to assist the patient with her initial assessment, but we were unsuccessful due to difficulties.

## 2021-02-22 NOTE — ED Notes (Signed)
Lunch meal tray given at this time.  

## 2021-02-22 NOTE — ED Notes (Signed)
Patient provided with additional warm blanket per request.  

## 2021-02-22 NOTE — ED Notes (Signed)
Psychiatry team at bedside conducting assessment

## 2021-02-22 NOTE — ED Provider Notes (Signed)
Procedures     ----------------------------------------- 2:25 PM on 02/22/2021 -----------------------------------------  Vitals remained stable.  Eating.  Seen by psychiatry and cleared for discharge.  Clinically sober at this point without any significant withdrawal symptoms.  Resources provided.    Sharman Cheek, MD 02/22/21 1426

## 2021-02-22 NOTE — ED Notes (Signed)
Pt started to vomit at this time. See orders.

## 2021-02-22 NOTE — BH Assessment (Signed)
This writer attempted to complete pt's initial assessment however pt was unable to participate. TTS to follow up when pt arouses.   

## 2021-02-22 NOTE — BH Assessment (Signed)
Comprehensive Clinical Assessment (CCA) Screening, Triage and Referral Note  02/22/2021 [place] [id number]  Chief Complaint:  Chief Complaint  Patient presents with  . detox evaluation    Visit Diagnosis: Substance Abuse Disorder  Patient Reported Information How did you hear about [place]? Self   Referral name: Self   Referral phone number: No data recorded Whom do you see for routine medical problems? No data recorded  Practice/Facility Name: No data recorded  Practice/Facility Phone Number: No data recorded  Name of Contact: No data recorded  Contact Number: No data recorded  Contact Fax Number: No data recorded  Prescriber Name: No data recorded  Prescriber Address (if known): No data recorded What Is the Reason for Your Visit/Call Today? Looking for sunbstance abuse treatment  How Long Has This Been Causing You Problems? 1 wk - 1 month  Have You Recently Been in Any Inpatient Treatment (Hospital/Detox/Crisis Center/28-Day Program)? No   Name/Location of Program/Hospital:No data recorded  How Long Were You There? No data recorded  When Were You Discharged? No data recorded Have You Ever Received Services From [facility] Before? Yes   Who Do You See at [facility]? Medical and mental health treatment  Have You Recently Had Any Thoughts About Hurting Yourself? No   Are You Planning to Commit Suicide/Harm Yourself At This time?  No  Have you Recently Had Thoughts About Hurting Someone [name]? No   Explanation: No data recorded Have You Used Any Alcohol or Drugs in the Past 24 Hours? No   How Long Ago Did You Use Drugs or Alcohol?  No data recorded  What Did You Use and How Much? No data recorded What Do You Feel Would Help You the Most Today? Alcohol or Drug Use Treatment  Do You Currently Have a Therapist/Psychiatrist? No   Name of Therapist/Psychiatrist: No data recorded  Have You Been Recently Discharged From Any Office Practice or Programs? No data  recorded  Explanation of Discharge From Practice/Program:  No data recorded    CCA Screening Triage Referral Assessment Type of Contact: Face-to-Face   Is this Initial or Reassessment? No data recorded  Date Telepsych consult ordered in [facility]:  08/20/2020   Time Telepsych consult ordered in [facility]:  0408  Patient Reported Information Reviewed? No data recorded  Patient Left Without Being Seen? No data recorded  Reason for Not Completing Assessment: No data recorded Collateral Involvement: No data recorded Does Patient Have a Court Appointed Legal Guardian? No data recorded  Name and Contact of Legal Guardian:  self   If Minor and Not Living with Parent(s), Who has Custody? n/a  Is CPS involved or ever been involved? Never  Is APS involved or ever been involved? Never  Patient Determined To Be At Risk for Harm To Self or Others Based on Review of Patient Reported Information or Presenting Complaint? No   Method: No data recorded  Availability of Means: No data recorded  Intent: No data recorded  Notification Required: No data recorded  Additional Information for Danger to Others Potential:  No data recorded  Additional Comments for Danger to Others Potential:  No data recorded  Are There Guns or Other Weapons in Your Home?  No data recorded   Types of Guns/Weapons: No data recorded   Are These Weapons Safely Secured?                              No data recorded  Who Could Verify You Are Able To Have These Secured:    No data recorded Do You Have any Outstanding Charges, Pending Court Dates, Parole/Probation? No data recorded Contacted To Inform of Risk of Harm To Self or Others: Family/Significant Other:  Location of Assessment: [facility] ED  Does Patient Present under Involuntary Commitment? No   IVC Papers Initial File Date: No data recorded  [place] of Residence: [place]  Patient Currently Receiving the Following Services: Individual Therapy   Determination of Need:  Emergent (2 hours)   Options For Referral: No data recorded  Lilyan Gilford MS, LCAS, [facility], NCC Therapeutic Triage Specialist 02/22/2021 2:16 PM

## 2021-02-22 NOTE — ED Notes (Signed)
Pt spilled water on linen at this time. Full linen change at this time.

## 2021-02-22 NOTE — ED Notes (Signed)
Pt discharged into husbands care. Discharge instructions reviewed with pt and husband. Pt denies pain. Husband signed paper copy of discharge instructions with pts permission.

## 2021-02-22 NOTE — ED Notes (Signed)
Breakfast tray and drink given.  

## 2021-02-22 NOTE — ED Notes (Signed)
Pt had one episode of emesis at this time. Bed linen changed, pt given emesis bag, and warm blankets given

## 2021-02-25 ENCOUNTER — Telehealth: Payer: Self-pay | Admitting: *Deleted

## 2021-02-25 NOTE — Telephone Encounter (Signed)
Transition Care Management Unsuccessful Follow-up Telephone Call  Date of discharge and from where:  02-21-2021 [facility]  Attempts: 1st  Reason for unsuccessful TCM follow-up call:  Left message / no answer

## 2021-02-26 ENCOUNTER — Emergency Department: Payer: BLUE CROSS/BLUE SHIELD

## 2021-02-26 ENCOUNTER — Other Ambulatory Visit: Payer: Self-pay

## 2021-02-26 ENCOUNTER — Emergency Department
Admission: EM | Admit: 2021-02-26 | Discharge: 2021-02-26 | Disposition: A | Discharge status: Home / Self Care | Payer: BLUE CROSS/BLUE SHIELD | Attending: Emergency Medicine | Admitting: Emergency Medicine

## 2021-02-26 DIAGNOSIS — M79604 Pain in right leg: Secondary | ICD-10-CM | POA: Diagnosis present

## 2021-02-26 DIAGNOSIS — M79605 Pain in left leg: Secondary | ICD-10-CM | POA: Insufficient documentation

## 2021-02-26 DIAGNOSIS — W01198A Fall on same level from slipping, tripping and stumbling with subsequent striking against other object, initial encounter: Secondary | ICD-10-CM | POA: Insufficient documentation

## 2021-02-26 DIAGNOSIS — Z5321 Procedure and treatment not carried out due to patient leaving prior to being seen by health care provider: Secondary | ICD-10-CM | POA: Diagnosis not present

## 2021-02-26 LAB — URINALYSIS, COMPLETE (UACMP) WITH MICROSCOPIC
Bilirubin Urine: NEGATIVE
Glucose, UA: NEGATIVE mg/dL
Hgb urine dipstick: NEGATIVE
Ketones, ur: 5 mg/dL — AB
Nitrite: POSITIVE — AB
Protein, ur: NEGATIVE mg/dL
Specific Gravity, Urine: 1.013 (ref 1.005–1.030)
pH: 5 (ref 5.0–8.0)

## 2021-02-26 LAB — BASIC METABOLIC PANEL
Anion gap: 11 (ref 5–15)
BUN: 11 mg/dL (ref 6–20)
CO2: 21 mmol/L — ABNORMAL LOW (ref 22–32)
Calcium: 8.5 mg/dL — ABNORMAL LOW (ref 8.9–10.3)
Chloride: 100 mmol/L (ref 98–111)
Creatinine, Ser: 0.61 mg/dL (ref 0.44–1.00)
GFR, Estimated: >60 mL/min (ref >60)
Glucose, Bld: 98 mg/dL (ref 70–99)
Potassium: 4.2 mmol/L (ref 3.5–5.1)
Sodium: 132 mmol/L — ABNORMAL LOW (ref 135–145)

## 2021-02-26 LAB — CBC
HCT: 39.3 % (ref 36.0–46.0)
Hemoglobin: 13.2 g/dL (ref 12.0–15.0)
MCH: 28.6 pg (ref 26.0–34.0)
MCHC: 33.6 g/dL (ref 30.0–36.0)
MCV: 85.1 fL (ref 80.0–100.0)
Platelets: 204 10*3/uL (ref 150–400)
RBC: 4.62 MIL/uL (ref 3.87–5.11)
RDW: 18 % — ABNORMAL HIGH (ref 11.5–15.5)
WBC: 5.8 10*3/uL (ref 4.0–10.5)
nRBC: 0 % (ref 0.0–0.2)

## 2021-02-26 LAB — POC URINE PREG, ED: Preg Test, Ur: NEGATIVE

## 2021-02-26 LAB — ETHANOL: Alcohol, Ethyl (B): 309 mg/dL (ref <10)

## 2021-02-26 NOTE — Telephone Encounter (Signed)
Transition Care Management Follow-up Telephone Call  Date of discharge and from where: 02/21/2021 [facility] ED  How have you been since you were released from the hospital? "I am okay"  Any questions or concerns? No  Items Reviewed:  Did the pt receive and understand the discharge instructions provided? Yes   Medications obtained and verified? Yes   Other? No   Any new allergies since your discharge? No   Dietary orders reviewed? No  Do you have support at home? Yes   Home Care and Equipment/Supplies: Were home health services ordered? not applicable If so, what is the name of the agency? N/A  Has the agency set up a time to come to the patient's home? not applicable Were any new equipment or medical supplies ordered?  No What is the name of the medical supply agency? N/A Were you able to get the supplies/equipment? not applicable Do you have any questions related to the use of the equipment or supplies? No  Functional Questionnaire: (I = Independent and D = Dependent) ADLs: I  Bathing/Dressing- I  Meal Prep- I  Eating- I  Maintaining continence- I  Transferring/Ambulation- I  Managing Meds- I  Follow up appointments reviewed:   PCP Hospital f/u appt confirmed? Yes  Scheduled to see PCP on 03/06/2021 @ 1600.  Specialist Hospital f/u appt confirmed? No    Are transportation arrangements needed? No   If their condition worsens, is the pt aware to call PCP or go to the Emergency Dept.? Yes  Was the patient provided with contact information for the PCP's office or ED? Yes  Was to pt encouraged to call back with questions or concerns? Yes

## 2021-02-26 NOTE — ED Notes (Signed)
MD Derrill Kay informed of pt's ETOH level of 309

## 2021-02-26 NOTE — ED Triage Notes (Signed)
BIBA from home c/o fall ~1000 r/t BL leg pain from knees down. Reports cannot feel legs when walking, but when seated has pain. Reports had nerve testing recently and has peripheral nerve dysfunction.   Patient endorses hitting head during fall. Unsure of LOC.   Patient awake alert, intoxicated but in NAD. Endorses drinking two beers this morning in addition to drinking all night.

## 2021-03-07 ENCOUNTER — Encounter: Payer: Self-pay | Admitting: Oncology

## 2021-03-07 ENCOUNTER — Ambulatory Visit: Payer: Medicaid Other | Admitting: Adult Health

## 2021-03-07 ENCOUNTER — Other Ambulatory Visit: Payer: Self-pay

## 2021-03-07 NOTE — Progress Notes (Deleted)
      Established patient visit   Patient: Katrina Turner   DOB: 08-Aug-1981   40 y.o. Female  MRN: [id number] Visit Date: 03/07/2021  Today's healthcare provider: Jairo Ben, FNP   No chief complaint on file.  Subjective    HPI  Patient presents for 3 wk f/u for lower extremity edema.  {Show patient history (optional):23778::" "}   Medications: Outpatient Medications Prior to Visit  Medication Sig  . 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  . cetirizine (ZYRTEC ALLERGY) 10 MG tablet Take 1 tablet (10 mg total) by mouth daily.  . DULoxetine (CYMBALTA) 20 MG capsule Take 1 capsule (20 mg total) by mouth daily.  . fluticasone (FLONASE) 50 MCG/ACT nasal spray Place 1 spray into both nostrils daily. (Patient not taking: No sig reported)  . hydrochlorothiazide (HYDRODIURIL) 25 MG tablet Take 0.5 tablets (12.5 mg total) by mouth daily.  . traZODone (DESYREL) 50 MG tablet Take 0.5 tablets (25 mg total) by mouth at bedtime.   No facility-administered medications prior to visit.    Review of Systems  {Labs  Heme  Chem  Endocrine  Serology  Results Review (optional):23779::" "}   Objective    There were no vitals taken for this visit. {Show previous vital signs (optional):23777::" "}   Physical Exam  ***  No results found for any visits on 03/07/21.  Assessment & Plan     ***  No follow-ups on file.      {provider attestation***:1}   Jairo Ben, FNP  [facility] [phone] (phone) [phone] (fax)  [facility]

## 2021-03-19 ENCOUNTER — Ambulatory Visit: Payer: Medicaid Other | Admitting: Adult Health

## 2021-03-27 ENCOUNTER — Ambulatory Visit: Payer: Medicaid Other | Admitting: Adult Health

## 2021-03-27 NOTE — Progress Notes (Signed)
      Established patient visit No show for ER follow up. Provider will send discharge letter to patient.

## 2021-05-13 NOTE — Progress Notes (Signed)
Patient is no longer my patient, not sure why labs came to me. Please verify with her if she has found a new PCP ? She should establish as soon as possible with a provider. She was discharged from my care 03/27/21 and provided 30 days of emergency care.  Provider also recommends patient see primary care physician for a routine physical and to establish primary care. Patient may chose provider of choice. Also gave the [place]  PHYSICIAN REFERRAL LINE at [phone] or web site at [email or web address] to help assist with finding a primary care doctor. Patient understands this office is acute care office only and no primary care is done at this office.

## 2021-05-14 NOTE — Progress Notes (Signed)
Let patient know her cholesterol is out of control, her liver enzymes are elevated, she has a gastroenterologist that I had referred her to for this she should call Gastroenterology to be seen for follow up and further work up as soon as possible. Recommend discontinuation of alcohol if she has not already and establishing a new PCP as below.    Katrina Pap, FNP  05/13/2021 8:52 AM EDT  Patient is no longer my patient, not sure why labs came to me. Please verify with her if she has found a new PCP ? She should establish as soon as possible with a provider. She was discharged from my care 03/27/21 and provided 30 days of emergency care.  Provider also recommends patient see primary care physician for a routine physical and to establish primary care. Patient may chose provider of choice. Also gave the [place] PHYSICIAN REFERRAL LINE at [phone] or web site at [email or web address] to help assist with finding a primary care doctor. Patient understands this office is acute care office only and no primary care is done at this office.

## 2021-05-15 LAB — CBC WITH DIFFERENTIAL/PLATELET
Basophils Absolute: 0 10*3/uL (ref 0.0–0.2)
Basos: 0 %
EOS (ABSOLUTE): 0.2 10*3/uL (ref 0.0–0.4)
Eos: 2 %
Hematocrit: 41.8 % (ref 34.0–46.6)
Hemoglobin: 13.6 g/dL (ref 11.1–15.9)
Immature Grans (Abs): 0 10*3/uL (ref 0.0–0.1)
Immature Granulocytes: 0 %
Lymphocytes Absolute: 1.6 10*3/uL (ref 0.7–3.1)
Lymphs: 22 %
MCH: 31.5 pg (ref 26.6–33.0)
MCHC: 32.5 g/dL (ref 31.5–35.7)
MCV: 97 fL (ref 79–97)
Monocytes Absolute: 0.6 10*3/uL (ref 0.1–0.9)
Monocytes: 8 %
Neutrophils Absolute: 4.8 10*3/uL (ref 1.4–7.0)
Neutrophils: 68 %
Platelets: 219 10*3/uL (ref 150–450)
RBC: 4.32 x10E6/uL (ref 3.77–5.28)
RDW: 13.8 % (ref 11.7–15.4)
WBC: 7.2 10*3/uL (ref 3.4–10.8)

## 2021-05-15 LAB — COMPREHENSIVE METABOLIC PANEL
ALT: 33 IU/L — ABNORMAL HIGH (ref 0–32)
AST: 72 IU/L — ABNORMAL HIGH (ref 0–40)
Albumin/Globulin Ratio: 1.1 — ABNORMAL LOW (ref 1.2–2.2)
Albumin: 4.3 g/dL (ref 3.8–4.8)
Alkaline Phosphatase: 204 IU/L — ABNORMAL HIGH (ref 44–121)
BUN/Creatinine Ratio: 11 (ref 9–23)
BUN: 9 mg/dL (ref 6–24)
Bilirubin Total: 1.2 mg/dL (ref 0.0–1.2)
CO2: 20 mmol/L (ref 20–29)
Calcium: 10.1 mg/dL (ref 8.7–10.2)
Chloride: 95 mmol/L — ABNORMAL LOW (ref 96–106)
Creatinine, Ser: 0.79 mg/dL (ref 0.57–1.00)
Globulin, Total: 3.8 g/dL (ref 1.5–4.5)
Glucose: 88 mg/dL (ref 65–99)
Potassium: 3.5 mmol/L (ref 3.5–5.2)
Sodium: 137 mmol/L (ref 134–144)
Total Protein: 8.1 g/dL (ref 6.0–8.5)
eGFR: 96 mL/min/{1.73_m2} (ref >59)

## 2021-05-15 LAB — LIPASE: Lipase: 111 U/L — ABNORMAL HIGH (ref 14–72)

## 2021-05-15 LAB — ETHANOL: Ethanol: <0.01 %

## 2021-05-15 LAB — LIPID PANEL W/O CHOL/HDL RATIO
Cholesterol, Total: 293 mg/dL — ABNORMAL HIGH (ref 100–199)
HDL: 96 mg/dL (ref >39)
LDL Chol Calc (NIH): 171 mg/dL — ABNORMAL HIGH (ref 0–99)
Triglycerides: 150 mg/dL — ABNORMAL HIGH (ref 0–149)
VLDL Cholesterol Cal: 26 mg/dL (ref 5–40)

## 2021-05-15 LAB — AMYLASE: Amylase: 48 U/L (ref 31–110)

## 2021-05-15 LAB — MAGNESIUM: Magnesium: 1.5 mg/dL — ABNORMAL LOW (ref 1.6–2.3)

## 2021-05-15 LAB — TSH: TSH: 3.9 u[IU]/mL (ref 0.450–4.500)

## 2021-05-16 NOTE — Progress Notes (Signed)
Letter has been sent out.

## 2021-08-06 ENCOUNTER — Other Ambulatory Visit: Payer: Medicaid Other

## 2021-08-16 ENCOUNTER — Other Ambulatory Visit: Payer: Medicaid Other

## 2021-09-05 ENCOUNTER — Ambulatory Visit: Payer: Medicaid Other | Admitting: Gerontology

## 2021-09-05 ENCOUNTER — Other Ambulatory Visit: Payer: Self-pay

## 2021-09-05 ENCOUNTER — Encounter: Payer: Self-pay | Admitting: Gerontology

## 2021-09-05 VITALS — BP 101/69 | HR 71 | Temp 97.0°F | Ht 68.25 in | Wt 149.0 lb

## 2021-09-05 DIAGNOSIS — R200 Anesthesia of skin: Secondary | ICD-10-CM

## 2021-09-05 DIAGNOSIS — J4520 Mild intermittent asthma, uncomplicated: Secondary | ICD-10-CM

## 2021-09-05 DIAGNOSIS — I7300 Raynaud's syndrome without gangrene: Secondary | ICD-10-CM

## 2021-09-05 DIAGNOSIS — R202 Paresthesia of skin: Secondary | ICD-10-CM

## 2021-09-05 DIAGNOSIS — Z8719 Personal history of other diseases of the digestive system: Secondary | ICD-10-CM

## 2021-09-05 DIAGNOSIS — G43709 Chronic migraine without aura, not intractable, without status migrainosus: Secondary | ICD-10-CM

## 2021-09-05 DIAGNOSIS — G8929 Other chronic pain: Secondary | ICD-10-CM

## 2021-09-05 DIAGNOSIS — Z7689 Persons encountering health services in other specified circumstances: Secondary | ICD-10-CM

## 2021-09-05 DIAGNOSIS — Z8659 Personal history of other mental and behavioral disorders: Secondary | ICD-10-CM

## 2021-09-05 DIAGNOSIS — Z8709 Personal history of other diseases of the respiratory system: Secondary | ICD-10-CM

## 2021-09-05 DIAGNOSIS — J302 Other seasonal allergic rhinitis: Secondary | ICD-10-CM

## 2021-09-05 MED ORDER — SUMATRIPTAN SUCCINATE 50 MG PO TABS
50.0000 mg | ORAL_TABLET | Freq: Once | ORAL | 0 refills | Status: AC
  Filled 2021-09-05: qty 10, 30d supply, fill #0

## 2021-09-05 MED ORDER — GABAPENTIN 100 MG PO CAPS
200.0000 mg | ORAL_CAPSULE | Freq: Two times a day (BID) | ORAL | 0 refills | Status: DC
  Filled 2021-09-05: qty 120, 30d supply, fill #0

## 2021-09-05 MED ORDER — ALBUTEROL SULFATE HFA 108 (90 BASE) MCG/ACT IN AERS
1.0000 | INHALATION_SPRAY | Freq: Four times a day (QID) | RESPIRATORY_TRACT | 2 refills | Status: AC | PRN
  Filled 2021-09-05: qty 6.7, 25d supply, fill #0
  Filled 2021-11-15: qty 6.7, 25d supply, fill #1

## 2021-09-05 MED ORDER — PANTOPRAZOLE SODIUM 40 MG PO TBEC
40.0000 mg | DELAYED_RELEASE_TABLET | Freq: Every day | ORAL | 0 refills | Status: DC
Start: 2021-09-05 — End: 2022-02-14
  Filled 2021-09-05: qty 30, 30d supply, fill #0

## 2021-09-05 MED ORDER — CETIRIZINE HCL 10 MG PO TABS
10.0000 mg | ORAL_TABLET | Freq: Every day | ORAL | 2 refills | Status: DC
  Filled 2021-09-05: qty 30, 30d supply, fill #0

## 2021-09-05 NOTE — Progress Notes (Signed)
New Patient Office Visit  Subjective:  Patient ID: Katrina Turner, female    DOB: [birth year]  Age: 40 y.o. MRN: [id number]  CC:  Chief Complaint  Patient presents with   Establish Care    Patient has various mental health disorders    HPI Katrina Turner is a 40 y/o female who has history of Allergy, Bipolar 1 disorder, Depression, GERD, Pancreatitis, PTSD, Reynolds syndrome, Neuromuscular disorder,presents to establish care and evaluation of her chronic conditions. She has a history of Asthma and uses albuterol inhaler and endorses intermittent shortness of breath She states that her last Asthma flare was a year ago. She also c/o dysuria, urinary urgency, frequency, flank and pelvic pain that has been going on for 1 week, but denies fever and chills. She has a history of multiple mental problem states that her mood is 'effy', denies suicidal nor homicidal ideation. She reports insomnia, auditory hallucination.She states that she takes effexor 37.5 mg bid for Migraine headache , gabapentin 200 mg bid for peripheral neuropathy due to Neuromuscular disorder. She also has a history of Rheynods syndrome that was diagnosed in 2016. She also drinks 2 cans of beer daily and admits the desire to cut back. Per patient ,she has a history of chronic severe abdominal pain due to mesh that was inserted in 2005 when she had C section and she reports that the procedure was performed at [facility] in [facility], by Dr Johnston Ebbs. She also has acid reflux which is not controlled with taking otc antacid. Overall, she states that she needs help, and offers no further complaint.      Past Medical History:  Diagnosis Date   Allergy    Bipolar 1 disorder ([place])    Depression    GERD (gastroesophageal reflux disease)    Neuromuscular disorder ([place])    Pancreatitis 11/28/2019   approximated    PTSD (post-traumatic stress disorder)    Reynolds syndrome [facility])     Past Surgical History:  Procedure  Laterality Date   APPENDECTOMY     CESAREAN SECTION     ESOPHAGOGASTRODUODENOSCOPY N/A 10/17/2020   Procedure: ESOPHAGOGASTRODUODENOSCOPY (EGD);  Surgeon: Toledo, Benay Pike, MD;  Location: [facility] ENDOSCOPY;  Service: Gastroenterology;  Laterality: N/A;   HERNIA REPAIR     PARTIAL HYSTERECTOMY     ovaries present not uterus- precancerous cells of uterus    ROUX-EN-Y GASTRIC BYPASS      Family History  Problem Relation Age of Onset   Heart attack Mother    Diabetes Father    Heart disease Father    Schizophrenia Brother    Autism Son    Pancreatic cancer Maternal Grandmother    Heart disease Maternal Grandfather    Seizures Paternal Grandmother    Heart disease Paternal Grandfather    Prostate cancer Paternal Grandfather     Social History   Socioeconomic History   Marital status: Married    Spouse name: Juanda Crumble   Number of children: 1   Years of education: Not on file   Highest education level: Not on file  Occupational History   Not on file  Tobacco Use   Smoking status: Every Day    Packs/day: 0.50    Years: 7.00    Pack years: 3.50    Types: Cigarettes   Smokeless tobacco: Never  Vaping Use   Vaping Use: Never used  Substance and Sexual Activity   Alcohol use: Yes    Alcohol/week: 14.0 standard drinks    Types: 14  Cans of beer per week    Comment: pt states a qt. a day   Drug use: Never   Sexual activity: Yes  Other Topics Concern   Not on file  Social History Narrative   Not on file   Social Determinants of Health   Financial Resource Strain: Not on file  Food Insecurity: No Food Insecurity   Worried About Running Out of Food in the Last Year: Never true   Ran Out of Food in the Last Year: Never true  Transportation Needs: No Transportation Needs   Lack of Transportation (Medical): No   Lack of Transportation (Non-Medical): No  Physical Activity: Not on file  Stress: Not on file  Social Connections: Not on file  Intimate Partner Violence: Not on  file    ROS Review of Systems  Constitutional: Negative.   Eyes: Negative.   Respiratory:  Positive for shortness of breath and wheezing.   Cardiovascular: Negative.   Gastrointestinal: Negative.   Endocrine: Negative.   Genitourinary:  Positive for dysuria.  Skin: Negative.   Neurological: Negative.   Hematological: Negative.   Psychiatric/Behavioral:  Positive for dysphoric mood and sleep disturbance. The patient is nervous/anxious.    Objective:   Today's Vitals: BP 101/69 (BP Location: Right Arm, Patient Position: Sitting, Cuff Size: Small)   Pulse 71   Temp (!) 97 F (36.1 C) (Temporal)   Ht 5' 8.25" (1.734 m)   Wt 149 lb (67.6 kg)   SpO2 94%   BMI 22.49 kg/m   Physical Exam HENT:     Head: Normocephalic and atraumatic.     Nose: Nose normal.     Mouth/Throat:     Mouth: Mucous membranes are moist.  Eyes:     Extraocular Movements: Extraocular movements intact.     Conjunctiva/sclera: Conjunctivae normal.     Pupils: Pupils are equal, round, and reactive to light.  Cardiovascular:     Rate and Rhythm: Normal rate and regular rhythm.     Pulses: Normal pulses.     Heart sounds: Normal heart sounds.  Pulmonary:     Effort: Pulmonary effort is normal.     Breath sounds: Normal breath sounds.  Abdominal:     General: Bowel sounds are normal.     Palpations: Abdomen is soft.     Tenderness: There is abdominal tenderness (with palpation).  Genitourinary:    Comments: Deferred per patient. Musculoskeletal:        General: Normal range of motion.     Cervical back: Normal range of motion.  Skin:    General: Skin is warm.  Neurological:     General: No focal deficit present.     Mental Status: She is alert and oriented to person, place, and time. Mental status is at baseline.  Psychiatric:        Mood and Affect: Mood normal.        Behavior: Behavior normal.        Thought Content: Thought content normal.        Judgment: Judgment normal.    Assessment  & Plan:    1. History of asthma - She will continue on Albuterol. - albuterol (VENTOLIN HFA) 108 (90 Base) MCG/ACT inhaler; Inhale 1-2 puffs into the lungs every 6 (six) hours as needed for wheezing or shortness of breath (excercise induced asthma).  Dispense: 6.7 g; Refill: 2   2 Encounter to establish care -Routine labs will be checked - CBC w/Diff; Future - Comp Met (CMET);  Future - Lipid panel; Future - TSH; Future - UA/M w/rflx Culture, Routine; Future - HgB A1c; Future - HgB A1c - TSH - Lipid panel - Comp Met (CMET) - CBC w/Diff  3. Chronic migraine without aura without status migrainosus, not intractable -She will start on Imitrex, she was educated on medication side effects and advised to notify clinic. - SUMAtriptan (IMITREX) 50 MG tablet; Take 1 tablet (50 mg total) by mouth once daily for 1 dose. May repeat in 2 hours if headache persists or recurs. (Max of 2 doses in 24 hours).  Dispense: 10 tablet; Refill: 0 - Ambulatory referral to Neurology  4. Raynaud's phenomenon without gangrene -She was advised to notify clinic for worsening symptoms.  5. Seasonal allergies - She will continue on current medication. - cetirizine (ZYRTEC ALLERGY) 10 MG tablet; Take 1 tablet (10 mg total) by mouth once daily.  Dispense: 30 tablet; Refill: 2  6 Numbness and tingling of both feet - She will continue on gabapentin for peripheral neuropathy and was advised to notify clinic for worsening symptoms. - gabapentin (NEURONTIN) 100 MG capsule; Take 2 capsules (200 mg total) by mouth 2 (two) times daily.  Dispense: 120 capsule; Refill: 0  7. Chronic abdominal pain - She was advised to complete [facility] financial application for  - Ambulatory referral to Gastroenterology  8. History of gastroesophageal reflux (GERD) - Her acid reflux is not controlled, she will start of Protonix, was educated on medicaton side effects and advised to notify clinic. - pantoprazole (PROTONIX) 40 MG tablet;  Take 1 tablet (40 mg total) by mouth once daily.  Dispense: 30 tablet; Refill: 0  9 History of anxiety - She was advised to call the Crisis help line for worsening symptoms and she will follow up with [facility] Behavioral health Ms. Pruitt.     Follow-up: Return in about 4 weeks (around 10/03/2021), or if symptoms worsen or fail to improve.   Claryce Friel Jerold Coombe, NP

## 2021-09-06 LAB — CBC WITH DIFFERENTIAL/PLATELET
Basophils Absolute: 0.1 10*3/uL (ref 0.0–0.2)
Basos: 1 %
EOS (ABSOLUTE): 0.2 10*3/uL (ref 0.0–0.4)
Eos: 4 %
Hematocrit: 36.8 % (ref 34.0–46.6)
Hemoglobin: 11.7 g/dL (ref 11.1–15.9)
Immature Grans (Abs): 0 10*3/uL (ref 0.0–0.1)
Immature Granulocytes: 0 %
Lymphocytes Absolute: 2.4 10*3/uL (ref 0.7–3.1)
Lymphs: 49 %
MCH: 27.5 pg (ref 26.6–33.0)
MCHC: 31.8 g/dL (ref 31.5–35.7)
MCV: 86 fL (ref 79–97)
Monocytes Absolute: 0.3 10*3/uL (ref 0.1–0.9)
Monocytes: 5 %
Neutrophils Absolute: 2.1 10*3/uL (ref 1.4–7.0)
Neutrophils: 41 %
Platelets: 399 10*3/uL (ref 150–450)
RBC: 4.26 x10E6/uL (ref 3.77–5.28)
RDW: 16.7 % — ABNORMAL HIGH (ref 11.7–15.4)
WBC: 5 10*3/uL (ref 3.4–10.8)

## 2021-09-06 LAB — COMPREHENSIVE METABOLIC PANEL
ALT: 15 IU/L (ref 0–32)
AST: 23 IU/L (ref 0–40)
Albumin/Globulin Ratio: 1.4 (ref 1.2–2.2)
Albumin: 3.8 g/dL (ref 3.8–4.8)
Alkaline Phosphatase: 131 IU/L — ABNORMAL HIGH (ref 44–121)
BUN/Creatinine Ratio: 13 (ref 9–23)
BUN: 6 mg/dL (ref 6–24)
Bilirubin Total: <0.2 mg/dL (ref 0.0–1.2)
CO2: 23 mmol/L (ref 20–29)
Calcium: 8.5 mg/dL — ABNORMAL LOW (ref 8.7–10.2)
Chloride: 100 mmol/L (ref 96–106)
Creatinine, Ser: 0.46 mg/dL — ABNORMAL LOW (ref 0.57–1.00)
Globulin, Total: 2.7 g/dL (ref 1.5–4.5)
Glucose: 79 mg/dL (ref 70–99)
Potassium: 4.7 mmol/L (ref 3.5–5.2)
Sodium: 136 mmol/L (ref 134–144)
Total Protein: 6.5 g/dL (ref 6.0–8.5)
eGFR: 123 mL/min/{1.73_m2} (ref >59)

## 2021-09-06 LAB — HEMOGLOBIN A1C
Est. average glucose Bld gHb Est-mCnc: 103 mg/dL
Hgb A1c MFr Bld: 5.2 % (ref 4.8–5.6)

## 2021-09-06 LAB — TSH: TSH: 0.983 u[IU]/mL (ref 0.450–4.500)

## 2021-09-06 LAB — LIPID PANEL
Chol/HDL Ratio: 3.4 ratio (ref 0.0–4.4)
Cholesterol, Total: 178 mg/dL (ref 100–199)
HDL: 52 mg/dL (ref >39)
LDL Chol Calc (NIH): 93 mg/dL (ref 0–99)
Triglycerides: 191 mg/dL — ABNORMAL HIGH (ref 0–149)
VLDL Cholesterol Cal: 33 mg/dL (ref 5–40)

## 2021-09-12 ENCOUNTER — Telehealth: Payer: Self-pay | Admitting: Licensed Clinical Social Worker

## 2021-09-12 NOTE — Telephone Encounter (Signed)
Left a message for patient to call back to move up initial intake from November to next week with [facility]. -BG

## 2021-09-16 ENCOUNTER — Other Ambulatory Visit: Payer: Self-pay

## 2021-09-16 ENCOUNTER — Ambulatory Visit: Payer: Medicaid Other | Admitting: Pharmacy Technician

## 2021-09-16 DIAGNOSIS — Z79899 Other long term (current) drug therapy: Secondary | ICD-10-CM
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
## 2021-09-17 ENCOUNTER — Institutional Professional Consult (permissible substitution): Payer: Medicaid Other | Admitting: Licensed Clinical Social Worker

## 2021-09-17 ENCOUNTER — Telehealth: Payer: Self-pay | Admitting: Licensed Clinical Social Worker

## 2021-09-17 NOTE — Telephone Encounter (Signed)
Called the patient twice during today's scheduled appointment; no answer, left a voicemail with the clinic contact information so they may reschedule.   

## 2021-09-21 ENCOUNTER — Other Ambulatory Visit: Payer: Self-pay

## 2021-09-21 ENCOUNTER — Emergency Department
Admission: EM | Admit: 2021-09-21 | Discharge: 2021-09-22 | Disposition: A | Discharge status: Home / Self Care | Payer: Self-pay | Attending: Emergency Medicine | Admitting: Emergency Medicine

## 2021-09-21 DIAGNOSIS — F1510 Other stimulant abuse, uncomplicated: Secondary | ICD-10-CM | POA: Insufficient documentation

## 2021-09-21 DIAGNOSIS — F17210 Nicotine dependence, cigarettes, uncomplicated: Secondary | ICD-10-CM | POA: Insufficient documentation

## 2021-09-21 DIAGNOSIS — F1010 Alcohol abuse, uncomplicated: Secondary | ICD-10-CM | POA: Diagnosis present

## 2021-09-21 DIAGNOSIS — Z20822 Contact with and (suspected) exposure to covid-19: Secondary | ICD-10-CM | POA: Insufficient documentation

## 2021-09-21 DIAGNOSIS — R45851 Suicidal ideations: Secondary | ICD-10-CM | POA: Insufficient documentation

## 2021-09-21 DIAGNOSIS — F32A Depression, unspecified: Secondary | ICD-10-CM | POA: Insufficient documentation

## 2021-09-21 DIAGNOSIS — F1994 Other psychoactive substance use, unspecified with psychoactive substance-induced mood disorder: Secondary | ICD-10-CM

## 2021-09-21 DIAGNOSIS — Y908 Blood alcohol level of 240 mg/100 ml or more: Secondary | ICD-10-CM | POA: Insufficient documentation

## 2021-09-21 DIAGNOSIS — F1910 Other psychoactive substance abuse, uncomplicated: Secondary | ICD-10-CM | POA: Insufficient documentation

## 2021-09-21 DIAGNOSIS — I10 Essential (primary) hypertension: Secondary | ICD-10-CM | POA: Diagnosis present

## 2021-09-21 DIAGNOSIS — Z79899 Other long term (current) drug therapy: Secondary | ICD-10-CM | POA: Insufficient documentation

## 2021-09-21 LAB — COMPREHENSIVE METABOLIC PANEL
ALT: 16 U/L (ref 0–44)
AST: 23 U/L (ref 15–41)
Albumin: 3.6 g/dL (ref 3.5–5.0)
Alkaline Phosphatase: 109 U/L (ref 38–126)
Anion gap: 14 (ref 5–15)
BUN: 13 mg/dL (ref 6–20)
CO2: 25 mmol/L (ref 22–32)
Calcium: 9 mg/dL (ref 8.9–10.3)
Chloride: 94 mmol/L — ABNORMAL LOW (ref 98–111)
Creatinine, Ser: 0.7 mg/dL (ref 0.44–1.00)
GFR, Estimated: >60 mL/min (ref >60)
Glucose, Bld: 99 mg/dL (ref 70–99)
Potassium: 4.4 mmol/L (ref 3.5–5.1)
Sodium: 133 mmol/L — ABNORMAL LOW (ref 135–145)
Total Bilirubin: 0.4 mg/dL (ref 0.3–1.2)
Total Protein: 8 g/dL (ref 6.5–8.1)

## 2021-09-21 LAB — RESP PANEL BY RT-PCR (FLU A&B, COVID) ARPGX2
Influenza A by PCR: NEGATIVE
Influenza B by PCR: NEGATIVE
SARS Coronavirus 2 by RT PCR: NEGATIVE

## 2021-09-21 LAB — CBC
HCT: 36.9 % (ref 36.0–46.0)
Hemoglobin: 12.8 g/dL (ref 12.0–15.0)
MCH: 28.9 pg (ref 26.0–34.0)
MCHC: 34.7 g/dL (ref 30.0–36.0)
MCV: 83.3 fL (ref 80.0–100.0)
Platelets: 471 10*3/uL — ABNORMAL HIGH (ref 150–400)
RBC: 4.43 MIL/uL (ref 3.87–5.11)
RDW: 18.5 % — ABNORMAL HIGH (ref 11.5–15.5)
WBC: 7.2 10*3/uL (ref 4.0–10.5)
nRBC: 0 % (ref 0.0–0.2)

## 2021-09-21 LAB — PREGNANCY, URINE: Preg Test, Ur: NEGATIVE

## 2021-09-21 LAB — URINE DRUG SCREEN, QUALITATIVE (ARMC ONLY)
Amphetamines, Ur Screen: NOT DETECTED
Barbiturates, Ur Screen: NOT DETECTED
Benzodiazepine, Ur Scrn: NOT DETECTED
Cannabinoid 50 Ng, Ur ~~LOC~~: NOT DETECTED
Cocaine Metabolite,Ur ~~LOC~~: NOT DETECTED
MDMA (Ecstasy)Ur Screen: NOT DETECTED
Methadone Scn, Ur: NOT DETECTED
Opiate, Ur Screen: NOT DETECTED
Phencyclidine (PCP) Ur S: NOT DETECTED
Tricyclic, Ur Screen: NOT DETECTED

## 2021-09-21 LAB — SALICYLATE LEVEL: Salicylate Lvl: <7 mg/dL — ABNORMAL LOW (ref 7.0–30.0)

## 2021-09-21 LAB — ACETAMINOPHEN LEVEL: Acetaminophen (Tylenol), Serum: <10 ug/mL — ABNORMAL LOW (ref 10–30)

## 2021-09-21 LAB — ETHANOL: Alcohol, Ethyl (B): 407 mg/dL (ref <10)

## 2021-09-21 MED ORDER — GABAPENTIN 100 MG PO CAPS
200.0000 mg | ORAL_CAPSULE | Freq: Two times a day (BID) | ORAL | Status: DC
  Administered 2021-09-22: 200 mg via ORAL
  Filled 2021-09-21: qty 2

## 2021-09-21 MED ORDER — THIAMINE HCL 100 MG PO TABS
100.0000 mg | ORAL_TABLET | Freq: Every day | ORAL | Status: DC
  Administered 2021-09-22: 100 mg via ORAL
  Filled 2021-09-21: qty 1

## 2021-09-21 MED ORDER — DULOXETINE HCL 20 MG PO CPEP
20.0000 mg | ORAL_CAPSULE | Freq: Every day | ORAL | Status: DC
  Administered 2021-09-22: 20 mg via ORAL
  Filled 2021-09-21: qty 1

## 2021-09-21 MED ORDER — PANTOPRAZOLE SODIUM 40 MG PO TBEC
40.0000 mg | DELAYED_RELEASE_TABLET | Freq: Every day | ORAL | Status: DC
  Administered 2021-09-22: 40 mg via ORAL
  Filled 2021-09-21: qty 1

## 2021-09-21 MED ORDER — ALBUTEROL SULFATE HFA 108 (90 BASE) MCG/ACT IN AERS
1.0000 | INHALATION_SPRAY | Freq: Four times a day (QID) | RESPIRATORY_TRACT | Status: DC | PRN
  Filled 2021-09-21: qty 6.7

## 2021-09-21 MED ORDER — LORAZEPAM 2 MG PO TABS: 0.0000 mg | ORAL_TABLET | Freq: Two times a day (BID) | ORAL | Status: DC

## 2021-09-21 MED ORDER — ALUM & MAG HYDROXIDE-SIMETH 200-200-20 MG/5ML PO SUSP: 30.0000 mL | Freq: Four times a day (QID) | ORAL | Status: DC | PRN

## 2021-09-21 MED ORDER — THIAMINE HCL 100 MG/ML IJ SOLN: 100.0000 mg | Freq: Every day | INTRAMUSCULAR | Status: DC

## 2021-09-21 MED ORDER — LORAZEPAM 2 MG PO TABS: 0.0000 mg | ORAL_TABLET | Freq: Four times a day (QID) | ORAL | Status: DC

## 2021-09-21 MED ORDER — LORAZEPAM 2 MG/ML IJ SOLN
0.0000 mg | Freq: Four times a day (QID) | INTRAMUSCULAR | Status: DC
Start: 2021-09-21 — End: 2021-09-22

## 2021-09-21 MED ORDER — ZIPRASIDONE MESYLATE 20 MG IM SOLR
20.0000 mg | Freq: Once | INTRAMUSCULAR | Status: AC
  Administered 2021-09-22: 20 mg via INTRAMUSCULAR
  Filled 2021-09-21: qty 20

## 2021-09-21 MED ORDER — LORAZEPAM 2 MG/ML IJ SOLN: 0.0000 mg | Freq: Two times a day (BID) | INTRAMUSCULAR | Status: DC

## 2021-09-21 MED ORDER — NICOTINE 21 MG/24HR TD PT24
21.0000 mg | MEDICATED_PATCH | Freq: Every day | TRANSDERMAL | Status: DC
  Administered 2021-09-22: 21 mg via TRANSDERMAL
  Filled 2021-09-21: qty 1

## 2021-09-21 MED ORDER — OLANZAPINE 5 MG PO TBDP
5.0000 mg | ORAL_TABLET | Freq: Once | ORAL | Status: AC
  Administered 2021-09-21: 5 mg via ORAL
  Filled 2021-09-21: qty 1

## 2021-09-21 NOTE — ED Notes (Signed)
RN received critical ETOH from lab of 407. RN notified provider.

## 2021-09-21 NOTE — ED Notes (Signed)
2 silver colored  rings with no stones, silver colored necklace with charm, brown colored boots, white colored socks, black colored t-shirt, tan colored shorts.  All belongings placed in labeled belongings bag to be secured on the unit.

## 2021-09-21 NOTE — ED Triage Notes (Signed)
Patient brought to ED by [place] [employer] voluntary for detox from alcohol and meth.

## 2021-09-21 NOTE — ED Provider Notes (Signed)
[facility] Emergency Department Provider Note  ____________________________________________   Event Date/Time   First MD Initiated Contact with Patient 09/21/21 2156     (approximate)  I have reviewed the triage vital signs and the nursing notes.   HISTORY  Chief Complaint Detox    HPI Katrina Turner is a 40 y.o. female with history of substance abuse, bipolar disorder, here with desire for detox and suicidal ideation.  Patient arrives intoxicated.  She reports that she has been drinking more and has been using methamphetamine regularly.  She states she would like to stop and is seeking help.  She also states that because of her use, she has been increasingly depressed.  She has been thinking of ways to end her life.  She would like help with this.  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  She also has a remote history of bypass and chronic abdominal pain related to this.  Denies recent medication changes.  Denies any actual suicide attempt.    Past Medical History:  Diagnosis Date   Allergy    Bipolar 1 disorder (HCC)    Depression    GERD (gastroesophageal reflux disease)    Neuromuscular disorder ([facility])    Pancreatitis 11/28/2019   approximated    PTSD (post-traumatic stress disorder)    Reynolds syndrome [facility])     Patient Active Problem List   Diagnosis Date Noted   Encounter to establish care 09/05/2021   History of anxiety 09/05/2021   Alcoholic intoxication without complication (HCC) 02/16/2021   GI bleed 10/16/2020   Essential hypertension 10/16/2020   Low back pain radiating to both legs 08/08/2020   Headache disorder 07/19/2020   Numbness and tingling of both feet 07/19/2020   Low hemoglobin 07/11/2020   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   Hot flashes 06/05/2020   Full dentures 06/05/2020   Chronic pancreatitis (HCC) 06/05/2020   Chronic migraine without aura without status migrainosus, not intractable  06/05/2020   Seasonal allergies 06/05/2020   High risk medication use 06/05/2020   Personality disorder (HCC) 06/05/2020   Urinary symptom or sign 06/05/2020   Skin lesion 06/05/2020   Alcohol abuse 06/27/2019   Bipolar 1 disorder (HCC) 05/24/2019   H/O gastric bypass 05/24/2019   Acute pancreatitis 05/24/2019   Alcohol use 05/24/2019   Tobacco use disorder 03/04/2019   Abdominal pain 03/03/2019   Lupus (HCC) 03/03/2019   Raynaud's phenomenon without gangrene 03/03/2019    Past Surgical History:  Procedure Laterality Date   APPENDECTOMY     CESAREAN SECTION     ESOPHAGOGASTRODUODENOSCOPY N/A 10/17/2020   Procedure: ESOPHAGOGASTRODUODENOSCOPY (EGD);  Surgeon: Toledo, Boykin Nearing, MD;  Location: [facility] ENDOSCOPY;  Service: Gastroenterology;  Laterality: N/A;   HERNIA REPAIR     PARTIAL HYSTERECTOMY     ovaries present not uterus- precancerous cells of uterus    ROUX-EN-Y GASTRIC BYPASS      Prior to Admission medications   Medication Sig Start Date End Date Taking? Authorizing Provider  albuterol (VENTOLIN HFA) 108 (90 Base) MCG/ACT inhaler Inhale 1-2 puffs into the lungs every 6 (six) hours as needed for wheezing or shortness of breath (excercise induced asthma). 09/05/21   Iloabachie, Chioma E, NP  cetirizine (ZYRTEC ALLERGY) 10 MG tablet Take 1 tablet (10 mg total) by mouth once daily. 09/05/21   Iloabachie, Chioma E, NP  DULoxetine (CYMBALTA) 20 MG capsule Take 1 capsule (20 mg total) by mouth daily. 02/12/21 03/14/21  Flinchum, Eula Fried, FNP  gabapentin (NEURONTIN) 100 MG capsule Take 2 capsules (200 mg total) by mouth 2 (two) times daily. 09/05/21   Iloabachie, Chioma E, NP  pantoprazole (PROTONIX) 40 MG tablet Take 1 tablet (40 mg total) by mouth once daily. 09/05/21   Iloabachie, Chioma E, NP  SUMAtriptan (IMITREX) 50 MG tablet Take 1 tablet (50 mg total) by mouth once daily for 1 dose. May repeat in 2 hours if headache persists or recurs. (Max of 2 doses in 24 hours). 09/05/21 09/13/21   Iloabachie, Chioma E, NP  traZODone (DESYREL) 50 MG tablet Take 0.5 tablets (25 mg total) by mouth at bedtime. Patient not taking: Reported on 09/05/2021 02/12/21   Flinchum, Eula Fried, FNP    Allergies Depakote er Mliss Sax sodium er], Other, Bee pollen, Bee venom, Carbamazepine, Iodinated diagnostic agents, and Nsaids  Family History  Problem Relation Age of Onset   Heart attack Mother    Diabetes Father    Heart disease Father    Schizophrenia Brother    Autism Son    Pancreatic cancer Maternal Grandmother    Heart disease Maternal Grandfather    Seizures Paternal Grandmother    Heart disease Paternal Grandfather    Prostate cancer Paternal Grandfather     Social History Social History   Tobacco Use   Smoking status: Every Day    Packs/day: 0.50    Years: 7.00    Pack years: 3.50    Types: Cigarettes   Smokeless tobacco: Never  Vaping Use   Vaping Use: Never used  Substance Use Topics   Alcohol use: Yes    Alcohol/week: 14.0 standard drinks    Types: 14 Cans of beer per week    Comment: pt states a qt. a day   Drug use: Never    Review of Systems  Review of Systems  Constitutional:  Positive for fatigue. Negative for fever.  HENT:  Negative for congestion and sore throat.   Eyes:  Negative for visual disturbance.  Respiratory:  Negative for cough and shortness of breath.   Cardiovascular:  Negative for chest pain.  Gastrointestinal:  Negative for abdominal pain, diarrhea, nausea and vomiting.  Genitourinary:  Negative for flank pain.  Musculoskeletal:  Negative for back pain and neck pain.  Skin:  Negative for rash and wound.  Neurological:  Negative for weakness.  Psychiatric/Behavioral:  Positive for dysphoric mood and suicidal ideas.   All other systems reviewed and are negative.   ____________________________________________  PHYSICAL EXAM:      VITAL SIGNS: ED Triage Vitals  Enc Vitals Group     BP 09/21/21 2139 (!) 135/96     Pulse Rate  09/21/21 2139 80     Resp 09/21/21 2139 20     Temp 09/21/21 2139 98.5 F (36.9 C)     Temp Source 09/21/21 2139 Oral     SpO2 09/21/21 2139 96 %     Weight 09/21/21 2140 130 lb (59 kg)     Height 09/21/21 2140 5\' 7"  (1.702 m)     Head Circumference --      Peak Flow --      Pain Score --      Pain Loc --      Pain Edu? --      Excl. in GC? --      Physical Exam Vitals and nursing note reviewed.  Constitutional:      General: She is not in acute distress.    Appearance: She is well-developed.  Comments: Disheveled, intoxicated  HENT:     Head: Normocephalic and atraumatic.  Eyes:     Conjunctiva/sclera: Conjunctivae normal.  Cardiovascular:     Rate and Rhythm: Normal rate and regular rhythm.     Heart sounds: Normal heart sounds. No murmur heard.   No friction rub.  Pulmonary:     Effort: Pulmonary effort is normal. No respiratory distress.     Breath sounds: Normal breath sounds. No wheezing or rales.  Abdominal:     General: There is no distension.     Palpations: Abdomen is soft.     Tenderness: There is no abdominal tenderness.  Musculoskeletal:     Cervical back: Neck supple.  Skin:    General: Skin is warm.     Capillary Refill: Capillary refill takes less than 2 seconds.  Neurological:     Mental Status: She is alert and oriented to person, place, and time.     Motor: No abnormal muscle tone.     Comments: Speech slightly slurred      ____________________________________________   LABS (all labs ordered are listed, but only abnormal results are displayed)  Labs Reviewed  COMPREHENSIVE METABOLIC PANEL - Abnormal; Notable for the following components:      Result Value   Sodium 133 (*)    Chloride 94 (*)    All other components within normal limits  ETHANOL - Abnormal; Notable for the following components:   Alcohol, Ethyl (B) 407 (*)    All other components within normal limits  SALICYLATE LEVEL - Abnormal; Notable for the following  components:   Salicylate Lvl <7.0 (*)    All other components within normal limits  ACETAMINOPHEN LEVEL - Abnormal; Notable for the following components:   Acetaminophen (Tylenol), Serum <10 (*)    All other components within normal limits  CBC - Abnormal; Notable for the following components:   RDW 18.5 (*)    Platelets 471 (*)    All other components within normal limits  RESP PANEL BY RT-PCR (FLU A&B, COVID) ARPGX2  URINE DRUG SCREEN, QUALITATIVE ([facility] ONLY)  PREGNANCY, URINE    ____________________________________________  EKG: Normal sinus rhythm, VR 69. PR 144, QRS 88, QTC 469. No acute ST elevations or depressions. No ischemia or infarct. ________________________________________  RADIOLOGY All imaging, including plain films, CT scans, and ultrasounds, independently reviewed by me, and interpretations confirmed via formal radiology reads.  ED MD interpretation:   None  Official radiology report(s): No results found.  ____________________________________________  PROCEDURES   Procedure(s) performed (including Critical Care):  Procedures  ____________________________________________  INITIAL IMPRESSION / MDM / ASSESSMENT AND PLAN / ED COURSE  As part of my medical decision making, I reviewed the following data within the electronic MEDICAL RECORD NUMBER Nursing notes reviewed and incorporated, Old chart reviewed, Notes from prior ED visits, and [place] Controlled Substance Database       *Anessia Oakland was evaluated in Emergency Department on 09/22/2021 for the symptoms described in the history of present illness. She was evaluated in the context of the global COVID-19 pandemic, which necessitated consideration that the patient might be at risk for infection with the SARS-CoV-2 virus that causes COVID-19. Institutional protocols and algorithms that pertain to the evaluation of patients at risk for COVID-19 are in a state of rapid change based on information released by  regulatory bodies including the CDC and federal and state organizations. These policies and algorithms were followed during the patient's care in the ED.  Some ED evaluations  and interventions may be delayed as a result of limited staffing during the pandemic.*     Medical Decision Making:  40 yo F here with alcohol intoxication, depression, reported SI. Pt significantly intoxicated on arrival. Here voluntarily but she became increasingly agitated in ED and attempted to leave, requiring PD involvement outside the ED ambulance bay. Pt subsequently IVCed given her significant intoxication and subsequent danger to herself. Suspect acute intoxication (etOH >400) and polysubstance abuse. Less likely primary depression/SI. Will plan to reassess once sober. Labs o/w reviewed, overall unremarkable. Tox labs negative. Lytes acceptable. CBC unremarkable.   ____________________________________________  FINAL CLINICAL IMPRESSION(S) / ED DIAGNOSES  Final diagnoses:  Polysubstance abuse (HCC)     MEDICATIONS GIVEN DURING THIS VISIT:  Medications  LORazepam (ATIVAN) injection 0-4 mg (0 mg Intravenous Not Given 09/21/21 2326)    Or  LORazepam (ATIVAN) tablet 0-4 mg ( Oral See Alternative 09/21/21 2326)  LORazepam (ATIVAN) injection 0-4 mg (has no administration in time range)    Or  LORazepam (ATIVAN) tablet 0-4 mg (has no administration in time range)  thiamine tablet 100 mg (has no administration in time range)    Or  thiamine (B-1) injection 100 mg (has no administration in time range)  nicotine (NICODERM CQ - dosed in mg/24 hours) patch 21 mg (has no administration in time range)  alum & mag hydroxide-simeth (MAALOX/MYLANTA) 200-200-20 MG/5ML suspension 30 mL (has no administration in time range)  albuterol (VENTOLIN HFA) 108 (90 Base) MCG/ACT inhaler 1-2 puff (has no administration in time range)  DULoxetine (CYMBALTA) DR capsule 20 mg (has no administration in time range)  gabapentin (NEURONTIN)  capsule 200 mg (200 mg Oral Not Given 09/22/21 0016)  pantoprazole (PROTONIX) EC tablet 40 mg (has no administration in time range)  OLANZapine zydis (ZYPREXA) disintegrating tablet 5 mg (5 mg Oral Given 09/21/21 2324)  ziprasidone (GEODON) injection 20 mg (20 mg Intramuscular Given 09/22/21 0016)     ED Discharge Orders     None        Note:  This document was prepared using Dragon voice recognition software and may include unintentional dictation errors.   Shaune Pollack, MD 09/22/21 [phone]

## 2021-09-22 DIAGNOSIS — F1994 Other psychoactive substance use, unspecified with psychoactive substance-induced mood disorder: Secondary | ICD-10-CM

## 2021-09-22 DIAGNOSIS — F1910 Other psychoactive substance abuse, uncomplicated: Secondary | ICD-10-CM

## 2021-09-22 DIAGNOSIS — F1010 Alcohol abuse, uncomplicated: Secondary | ICD-10-CM

## 2021-09-22 NOTE — ED Notes (Signed)
Breakfast tray given. No other needs found at this moment.  
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
## 2021-09-22 NOTE — Progress Notes (Signed)
Patient seen and evaluated by Dr. Toni Amend who recommends rescinding IVC and outpatient management.  Alcohol detox was offered, but patient declines.  We will discharge with return precautions.

## 2021-09-22 NOTE — ED Notes (Signed)
Crackers was given to pt.

## 2021-09-22 NOTE — ED Notes (Signed)
IVC/pending reassessment in AM. 

## 2021-09-22 NOTE — ED Notes (Signed)
Pt observed crawling on hands and knees in hallway. Pt states that she is leaving "AMA". Encouraged pt to go back into room without success. Pt stood up and began walking towards ambulance entrance of ED. Attempts to redirect pt to room unsuccessful. Pt exited hospital and Dr. Penne Lash informed.

## 2021-09-22 NOTE — ED Notes (Signed)
Pt walked to bathroom but had to use walls-slightly unsteady.

## 2021-09-22 NOTE — Consult Note (Signed)
[facility] Face-to-Face Psychiatry Consult   Reason for Consult:  Psych Consult Referring Physician:  Dr. Erma Heritage Patient Identification: Katrina Turner MRN:  [id number] Principal Diagnosis: Alcohol abuse Diagnosis:  Principal Problem:   Alcohol abuse   Total Time spent with patient: 45 minutes    HPI:  Katrina Turner, 40 y.o., female patient seen by TTS and this provider; chart reviewed and consulted with Dr. Marcello Moores on 09/22/21.  On evaluation Katrina Turner reports that she is here for detox services.   For detailed note see TTS tele assessment note below  Patient is a 40 year old female presenting to [facility] ED voluntarily seeking assistance with detox treatment. 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 During assessment patient appears alert and oriented x4, calm and cooperative. Patient reports that she has been drinking alcohol "daily" and reports drinking "1 15 pack of beer." Patient reports that she has been drinking heavily for 10 years. 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 Patient reports that she uses "once a week" and uses "less than a gram." Patient reports current withdrawals as "nausea." Patient reports that she has had treatment in the past at [facility] and is currently living with her husband who also uses Meth. Patient denies SI/HI/AH/VH and does not appear to be responding to any internal or external stimuli.   Recommendations:  Reassess in the am after patient sobers up.  Disposition:  Patient is psychiatrically cleared Past Psychiatric History: Alcohol a buse  Risk to Self:   Risk to Others:   Prior Inpatient Therapy:   Prior Outpatient Therapy:    Past Medical History:  Past Medical History:  Diagnosis Date   Allergy    Bipolar 1 disorder (HCC)    Depression    GERD (gastroesophageal reflux disease)    Neuromuscular disorder (HCC)    Pancreatitis 11/28/2019   approximated    PTSD (post-traumatic  stress disorder)    Reynolds syndrome [facility])     Past Surgical History:  Procedure Laterality Date   APPENDECTOMY     CESAREAN SECTION     ESOPHAGOGASTRODUODENOSCOPY N/A 10/17/2020   Procedure: ESOPHAGOGASTRODUODENOSCOPY (EGD);  Surgeon: Toledo, Boykin Nearing, MD;  Location: [facility] ENDOSCOPY;  Service: Gastroenterology;  Laterality: N/A;   HERNIA REPAIR     PARTIAL HYSTERECTOMY     ovaries present not uterus- precancerous cells of uterus    ROUX-EN-Y GASTRIC BYPASS     Family History:  Family History  Problem Relation Age of Onset   Heart attack Mother    Diabetes Father    Heart disease Father    Schizophrenia Brother    Autism Son    Pancreatic cancer Maternal Grandmother    Heart disease Maternal Grandfather    Seizures Paternal Grandmother    Heart disease Paternal Grandfather    Prostate cancer Paternal Grandfather    Family Psychiatric  History:  Social History:  Social History   Substance and Sexual Activity  Alcohol Use Yes   Alcohol/week: 14.0 standard drinks   Types: 14 Cans of beer per week   Comment: pt states a qt. a day     Social History   Substance and Sexual Activity  Drug Use Never    Social History   Socioeconomic History   Marital status: Married    Spouse name: [name]   Number of children: 1   Years of education: Not on file   Highest education level: Not on file  Occupational History  Not on file  Tobacco Use   Smoking status: Every Day    Packs/day: 0.50    Years: 7.00    Pack years: 3.50    Types: Cigarettes   Smokeless tobacco: Never  Vaping Use   Vaping Use: Never used  Substance and Sexual Activity   Alcohol use: Yes    Alcohol/week: 14.0 standard drinks    Types: 14 Cans of beer per week    Comment: pt states a qt. a day   Drug use: Never   Sexual activity: Yes  Other Topics Concern   Not on file  Social History Narrative   Not on file   Social Determinants of Health   Financial Resource Strain: Not on file  Food  Insecurity: No Food Insecurity   Worried About Running Out of Food in the Last Year: Never true   Ran Out of Food in the Last Year: Never true  Transportation Needs: No Transportation Needs   Lack of Transportation (Medical): No   Lack of Transportation (Non-Medical): No  Physical Activity: Not on file  Stress: Not on file  Social Connections: Not on file   Additional Social History:    Allergies:   Allergies  Allergen Reactions   Depakote Er [Divalproex Sodium Er]     Alopecia and hive   Other Swelling    Throat swelling per pt   Bee Pollen Other (See Comments)   Bee Venom    Carbamazepine Hives   Iodinated Diagnostic Agents Hives and Other (See Comments)    Tongue swells.    Nsaids     Labs:  Results for orders placed or performed during the hospital encounter of 09/21/21 (from the past 48 hour(s))  Comprehensive metabolic panel     Status: Abnormal   Collection Time: 09/21/21  9:44 PM  Result Value Ref Range   Sodium 133 (L) 135 - 145 mmol/L   Potassium 4.4 3.5 - 5.1 mmol/L   Chloride 94 (L) 98 - 111 mmol/L   CO2 25 22 - 32 mmol/L   Glucose, Bld 99 70 - 99 mg/dL    Comment: Glucose reference range applies only to samples taken after fasting for at least 8 hours.   BUN 13 6 - 20 mg/dL   Creatinine, Ser 2.68 0.44 - 1.00 mg/dL   Calcium 9.0 8.9 - 34.1 mg/dL   Total Protein 8.0 6.5 - 8.1 g/dL   Albumin 3.6 3.5 - 5.0 g/dL   AST 23 15 - 41 U/L   ALT 16 0 - 44 U/L   Alkaline Phosphatase 109 38 - 126 U/L   Total Bilirubin 0.4 0.3 - 1.2 mg/dL   GFR, Estimated >96 >22 mL/min    Comment: (NOTE) Calculated using the CKD-EPI Creatinine Equation (2021)    Anion gap 14 5 - 15    Comment: Performed at [facility], [address] [place], [place] [address]  Ethanol     Status: Abnormal   Collection Time: 09/21/21  9:44 PM  Result Value Ref Range   Alcohol, Ethyl (B) 407 (HH) <10 mg/dL    Comment: CRITICAL RESULT CALLED TO, READ BACK BY AND VERIFIED  WITH BRANDON GROGGM RN AT AT 2224 ON 09/21/2021 GAA (NOTE) Lowest detectable limit for serum alcohol is 10 mg/dL.  For medical purposes only. Performed at [facility], [address] [place], [place] [address]   Salicylate level     Status: Abnormal   Collection Time: 09/21/21  9:44 PM  Result  Value Ref Range   Salicylate Lvl <7.0 (L) 7.0 - 30.0 mg/dL    Comment: Performed at [facility], [address] [place], [place] [address]  Acetaminophen level     Status: Abnormal   Collection Time: 09/21/21  9:44 PM  Result Value Ref Range   Acetaminophen (Tylenol), Serum <10 (L) 10 - 30 ug/mL    Comment: (NOTE) Therapeutic concentrations vary significantly. A range of 10-30 ug/mL  may be an effective concentration for many patients. However, some  are best treated at concentrations outside of this range. Acetaminophen concentrations >150 ug/mL at 4 hours after ingestion  and >50 ug/mL at 12 hours after ingestion are often associated with  toxic reactions.  Performed at [facility], [address] [place], [place] [address]   cbc     Status: Abnormal   Collection Time: 09/21/21  9:44 PM  Result Value Ref Range   WBC 7.2 4.0 - 10.5 K/uL   RBC 4.43 3.87 - 5.11 MIL/uL   Hemoglobin 12.8 12.0 - 15.0 g/dL   HCT 29.5 62.1 - 30.8 %   MCV 83.3 80.0 - 100.0 fL   MCH 28.9 26.0 - 34.0 pg   MCHC 34.7 30.0 - 36.0 g/dL   RDW 65.7 (H) 84.6 - 96.2 %   Platelets 471 (H) 150 - 400 K/uL   nRBC 0.0 0.0 - 0.2 %    Comment: Performed at [facility], [address] [place], [place] [address]  Urine Drug Screen, Qualitative     Status: None   Collection Time: 09/21/21  9:44 PM  Result Value Ref Range   Tricyclic, Ur Screen NONE DETECTED NONE DETECTED   Amphetamines, Ur Screen NONE DETECTED NONE DETECTED   MDMA (Ecstasy)Ur Screen NONE DETECTED NONE DETECTED   Cocaine Metabolite,Ur [place] NONE DETECTED NONE DETECTED   Opiate, Ur Screen NONE DETECTED NONE  DETECTED   Phencyclidine (PCP) Ur S NONE DETECTED NONE DETECTED   Cannabinoid 50 Ng, Ur [place] NONE DETECTED NONE DETECTED   Barbiturates, Ur Screen NONE DETECTED NONE DETECTED   Benzodiazepine, Ur Scrn NONE DETECTED NONE DETECTED   Methadone Scn, Ur NONE DETECTED NONE DETECTED    Comment: (NOTE) Tricyclics + metabolites, urine    Cutoff 1000 ng/mL Amphetamines + metabolites, urine  Cutoff 1000 ng/mL MDMA (Ecstasy), urine              Cutoff 500 ng/mL Cocaine Metabolite, urine          Cutoff 300 ng/mL Opiate + metabolites, urine        Cutoff 300 ng/mL Phencyclidine (PCP), urine         Cutoff 25 ng/mL Cannabinoid, urine                 Cutoff 50 ng/mL Barbiturates + metabolites, urine  Cutoff 200 ng/mL Benzodiazepine, urine              Cutoff 200 ng/mL Methadone, urine                   Cutoff 300 ng/mL  The urine drug screen provides only a preliminary, unconfirmed analytical test result and should not be used for non-medical purposes. Clinical consideration and professional judgment should be applied to any positive drug screen result due to possible interfering substances. A more specific alternate chemical method must be used in order to obtain a confirmed analytical result. Gas chromatography / mass spectrometry (GC/MS) is the preferred confirm atory method. Performed at [facility], [address] [place], [place] [address]   Pregnancy, urine     Status: None   Collection Time: 09/21/21  9:44 PM  Result Value Ref Range   Preg Test, Ur NEGATIVE NEGATIVE    Comment: Performed at [facility], [address] [place], [place] [address]  Resp Panel by RT-PCR (Flu A&B, Covid)     Status: None   Collection Time: 09/21/21 10:30 PM   Specimen: Nasopharyngeal(NP) swabs in vial transport medium  Result Value Ref Range   SARS Coronavirus 2 by RT PCR NEGATIVE NEGATIVE    Comment: (NOTE) SARS-CoV-2 target nucleic acids are NOT DETECTED.  The SARS-CoV-2 RNA is  generally detectable in upper respiratory specimens during the acute phase of infection. The lowest concentration of SARS-CoV-2 viral copies this assay can detect is 138 copies/mL. A negative result does not preclude SARS-Cov-2 infection and should not be used as the sole basis for treatment or other patient management decisions. A negative result may occur with  improper specimen collection/handling, submission of specimen other than nasopharyngeal swab, presence of viral mutation(s) within the areas targeted by this assay, and inadequate number of viral copies(<138 copies/mL). A negative result must be combined with clinical observations, patient history, and epidemiological information. The expected result is Negative.  Fact Sheet for Patients:  BloggerCourse.com  Fact Sheet for Healthcare Providers:  SeriousBroker.it  This test is no t yet approved or cleared by the [place] FDA and  has been authorized for detection and/or diagnosis of SARS-CoV-2 by FDA under an Emergency Use Authorization (EUA). This EUA will remain  in effect (meaning this test can be used) for the duration of the COVID-19 declaration under Section 564(b)(1) of the Act, 21 U.S.C.section 360bbb-3(b)(1), unless the authorization is terminated  or revoked sooner.       Influenza A by PCR NEGATIVE NEGATIVE   Influenza B by PCR NEGATIVE NEGATIVE    Comment: (NOTE) The Xpert Xpress SARS-CoV-2/FLU/RSV plus assay is intended as an aid in the diagnosis of influenza from Nasopharyngeal swab specimens and should not be used as a sole basis for treatment. Nasal washings and aspirates are unacceptable for Xpert Xpress SARS-CoV-2/FLU/RSV testing.  Fact Sheet for Patients: BloggerCourse.com  Fact Sheet for Healthcare Providers: SeriousBroker.it  This test is not yet approved or cleared by the [place] FDA  and has been authorized for detection and/or diagnosis of SARS-CoV-2 by FDA under an Emergency Use Authorization (EUA). This EUA will remain in effect (meaning this test can be used) for the duration of the COVID-19 declaration under Section 564(b)(1) of the Act, 21 U.S.C. section 360bbb-3(b)(1), unless the authorization is terminated or revoked.  Performed at [facility], [address] [place], [place] [address]     Current Facility-Administered Medications  Medication Dose Route Frequency Provider Last Rate Last Admin   albuterol (VENTOLIN HFA) 108 (90 Base) MCG/ACT inhaler 1-2 puff  1-2 puff Inhalation Q6H PRN Shaune Pollack, MD       alum & mag hydroxide-simeth (MAALOX/MYLANTA) 200-200-20 MG/5ML suspension 30 mL  30 mL Oral Q6H PRN Shaune Pollack, MD       DULoxetine (CYMBALTA) DR capsule 20 mg  20 mg Oral Daily Shaune Pollack, MD       gabapentin (NEURONTIN) capsule 200 mg  200 mg Oral BID Shaune Pollack, MD       LORazepam (ATIVAN) injection 0-4 mg  0-4 mg Intravenous Q6H Shaune Pollack, MD       Or  LORazepam (ATIVAN) tablet 0-4 mg  0-4 mg Oral Q6H Shaune Pollack, MD       [START ON 09/24/2021] LORazepam (ATIVAN) injection 0-4 mg  0-4 mg Intravenous Q12H Shaune Pollack, MD       Or   Melene Muller ON 09/24/2021] LORazepam (ATIVAN) tablet 0-4 mg  0-4 mg Oral Q12H Shaune Pollack, MD       nicotine (NICODERM CQ - dosed in mg/24 hours) patch 21 mg  21 mg Transdermal Daily Shaune Pollack, MD       pantoprazole (PROTONIX) EC tablet 40 mg  40 mg Oral Daily Shaune Pollack, MD       thiamine tablet 100 mg  100 mg Oral Daily Shaune Pollack, MD       Or   thiamine (B-1) injection 100 mg  100 mg Intravenous Daily Shaune Pollack, MD       Current Outpatient Medications  Medication Sig Dispense Refill   albuterol (VENTOLIN HFA) 108 (90 Base) MCG/ACT inhaler Inhale 1-2 puffs into the lungs every 6 (six) hours as needed for wheezing or shortness of breath (excercise induced  asthma). 6.7 g 2   cetirizine (ZYRTEC ALLERGY) 10 MG tablet Take 1 tablet (10 mg total) by mouth once daily. 30 tablet 2   DULoxetine (CYMBALTA) 20 MG capsule Take 1 capsule (20 mg total) by mouth daily. 30 capsule 0   gabapentin (NEURONTIN) 100 MG capsule Take 2 capsules (200 mg total) by mouth 2 (two) times daily. 120 capsule 0   pantoprazole (PROTONIX) 40 MG tablet Take 1 tablet (40 mg total) by mouth once daily. 30 tablet 0   SUMAtriptan (IMITREX) 50 MG tablet Take 1 tablet (50 mg total) by mouth once daily for 1 dose. May repeat in 2 hours if headache persists or recurs. (Max of 2 doses in 24 hours). 10 tablet 0   traZODone (DESYREL) 50 MG tablet Take 0.5 tablets (25 mg total) by mouth at bedtime. (Patient not taking: Reported on 09/05/2021) 15 tablet 0    Musculoskeletal: Strength & Muscle Tone: within normal limits Gait & Station: normal Patient leans: N/A  Psychiatric Specialty Exam:  Presentation  General Appearance: Bizarre; Disheveled  Eye Contact:Fleeting  Speech:Garbled  Speech Volume:Decreased  Handedness:Right   Mood and Affect  Mood:Anxious; Irritable; Labile (intoxicated)  Affect:Inappropriate; Labile   Thought Process  Thought Processes:Disorganized  Descriptions of Associations:Tangential  Orientation:Full (Time, Place and Person)  Thought Content:WDL  History of Schizophrenia/Schizoaffective disorder:No  Duration of Psychotic Symptoms:No data recorded Hallucinations:Hallucinations: None  Ideas of Reference:None  Suicidal Thoughts:Suicidal Thoughts: No  Homicidal Thoughts:Homicidal Thoughts: No   Sensorium  Memory:Immediate Poor  Judgment:Impaired  Insight:Lacking   Executive Functions  Concentration:Poor  Attention Span:Poor  Recall:Poor  Fund of Knowledge:Poor  Language:Poor   Psychomotor Activity  Psychomotor Activity:Psychomotor Activity: Shuffling Gait   Assets  Assets:Desire for Improvement   Sleep   Sleep:Sleep: Fair   Physical Exam: Physical Exam Vitals and nursing note reviewed.  HENT:     Head: Normocephalic.     Nose: Nose normal.  Eyes:     Pupils: Pupils are equal, round, and reactive to light.  Pulmonary:     Effort: Pulmonary effort is normal.  Musculoskeletal:        General: Normal range of motion.     Cervical back: Normal range of motion.  Neurological:     General: No focal deficit present.     Mental Status: She is alert and oriented to person, place, and time.  Psychiatric:  Attention and Perception: She is inattentive.        Mood and Affect: Affect is inappropriate.        Speech: Speech is slurred.        Behavior: Behavior is uncooperative.        Cognition and Memory: Cognition is impaired.        Judgment: Judgment is impulsive and inappropriate.   ROS Blood pressure (!) 135/96, pulse 60, temperature 98.8 F (37.1 C), temperature source Oral, resp. rate 16, height 5\' 7"  (1.702 m), weight 59 kg, SpO2 90 %. Body mass index is 20.36 kg/m.   , NP 09/22/2021 4:26 AM

## 2021-09-22 NOTE — ED Notes (Signed)
Water was given to pt.

## 2021-09-22 NOTE — ED Notes (Signed)
Pt moved from room 25 to room 22 via wheelchair. Pt transferred from wheelchair to bed with 1-person assist. Pt cooperative, loud. Pt states "I'm disappointed in myself." Pt c/o pain in abdominal area due to "defective mesh", which she rates 10/10. Pt denies SI/HI/AVH at this time and states "I just want to be loved." Pt reports that it's been one week since she last slept and it has been "years" since she got a good night's sleep. Pt expresses feelings of depression and anxiety, stating "I screwed up my freaking marriage" because of drinking and infidelity. Pt appeared to be crying at times but no tears observed.
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
## 2021-09-22 NOTE — ED Notes (Signed)
Rescinded by Dr. Clapacs 

## 2021-09-22 NOTE — ED Notes (Signed)
Pt left emergency depasrtment, pulled fire alarm and broke ems bay doors leaving. Pt found siting outside by helicopter landing area asking for a cigarrette and telling staff to "fuck off". Pt is now ivc. Pt attempted to run from parking lot, stopped by security and taken back to room in wheelchair by melody, ed tech.

## 2021-09-22 NOTE — ED Notes (Signed)
Pt escorted back to room 22 by hospital security without incident.

## 2021-09-22 NOTE — ED Notes (Signed)
Pt easily awakened. Denies any needs. Vitals taken.

## 2021-09-23 ENCOUNTER — Other Ambulatory Visit: Payer: Self-pay

## 2021-09-24 ENCOUNTER — Emergency Department: Payer: Self-pay

## 2021-09-24 ENCOUNTER — Telehealth: Payer: Self-pay | Admitting: Emergency Medicine

## 2021-09-24 ENCOUNTER — Encounter: Payer: Self-pay | Admitting: Radiology

## 2021-09-24 ENCOUNTER — Emergency Department
Admission: EM | Admit: 2021-09-24 | Discharge: 2021-09-25 | Disposition: A | Discharge status: Home / Self Care | Payer: Self-pay | Attending: Emergency Medicine | Admitting: Emergency Medicine

## 2021-09-24 ENCOUNTER — Other Ambulatory Visit: Payer: Self-pay

## 2021-09-24 DIAGNOSIS — R1084 Generalized abdominal pain: Secondary | ICD-10-CM | POA: Insufficient documentation

## 2021-09-24 DIAGNOSIS — R45851 Suicidal ideations: Secondary | ICD-10-CM | POA: Insufficient documentation

## 2021-09-24 DIAGNOSIS — F17210 Nicotine dependence, cigarettes, uncomplicated: Secondary | ICD-10-CM | POA: Insufficient documentation

## 2021-09-24 DIAGNOSIS — F1010 Alcohol abuse, uncomplicated: Secondary | ICD-10-CM | POA: Diagnosis present

## 2021-09-24 DIAGNOSIS — I10 Essential (primary) hypertension: Secondary | ICD-10-CM | POA: Insufficient documentation

## 2021-09-24 DIAGNOSIS — R0781 Pleurodynia: Secondary | ICD-10-CM | POA: Insufficient documentation

## 2021-09-24 DIAGNOSIS — F32A Depression, unspecified: Secondary | ICD-10-CM

## 2021-09-24 DIAGNOSIS — F1994 Other psychoactive substance use, unspecified with psychoactive substance-induced mood disorder: Secondary | ICD-10-CM | POA: Diagnosis present

## 2021-09-24 DIAGNOSIS — F332 Major depressive disorder, recurrent severe without psychotic features: Secondary | ICD-10-CM | POA: Insufficient documentation

## 2021-09-24 DIAGNOSIS — Y908 Blood alcohol level of 240 mg/100 ml or more: Secondary | ICD-10-CM | POA: Insufficient documentation

## 2021-09-24 LAB — URINE DRUG SCREEN, QUALITATIVE (ARMC ONLY)
Amphetamines, Ur Screen: NOT DETECTED
Barbiturates, Ur Screen: NOT DETECTED
Benzodiazepine, Ur Scrn: NOT DETECTED
Cannabinoid 50 Ng, Ur ~~LOC~~: NOT DETECTED
Cocaine Metabolite,Ur ~~LOC~~: NOT DETECTED
MDMA (Ecstasy)Ur Screen: NOT DETECTED
Methadone Scn, Ur: NOT DETECTED
Opiate, Ur Screen: NOT DETECTED
Phencyclidine (PCP) Ur S: NOT DETECTED
Tricyclic, Ur Screen: NOT DETECTED

## 2021-09-24 LAB — COMPREHENSIVE METABOLIC PANEL
ALT: 14 U/L (ref 0–44)
AST: 23 U/L (ref 15–41)
Albumin: 3.8 g/dL (ref 3.5–5.0)
Alkaline Phosphatase: 141 U/L — ABNORMAL HIGH (ref 38–126)
Anion gap: 11 (ref 5–15)
BUN: 8 mg/dL (ref 6–20)
CO2: 26 mmol/L (ref 22–32)
Calcium: 9.2 mg/dL (ref 8.9–10.3)
Chloride: 100 mmol/L (ref 98–111)
Creatinine, Ser: 0.51 mg/dL (ref 0.44–1.00)
GFR, Estimated: >60 mL/min (ref >60)
Glucose, Bld: 84 mg/dL (ref 70–99)
Potassium: 4.5 mmol/L (ref 3.5–5.1)
Sodium: 137 mmol/L (ref 135–145)
Total Bilirubin: 0.3 mg/dL (ref 0.3–1.2)
Total Protein: 8.1 g/dL (ref 6.5–8.1)

## 2021-09-24 LAB — D-DIMER, QUANTITATIVE: D-Dimer, Quant: 1.02 ug/mL-FEU — ABNORMAL HIGH (ref 0.00–0.50)

## 2021-09-24 LAB — CBC
HCT: 39.5 % (ref 36.0–46.0)
Hemoglobin: 12.5 g/dL (ref 12.0–15.0)
MCH: 27.1 pg (ref 26.0–34.0)
MCHC: 31.6 g/dL (ref 30.0–36.0)
MCV: 85.7 fL (ref 80.0–100.0)
Platelets: 385 10*3/uL (ref 150–400)
RBC: 4.61 MIL/uL (ref 3.87–5.11)
RDW: 18.4 % — ABNORMAL HIGH (ref 11.5–15.5)
WBC: 5.1 10*3/uL (ref 4.0–10.5)
nRBC: 0 % (ref 0.0–0.2)

## 2021-09-24 LAB — SALICYLATE LEVEL: Salicylate Lvl: <7 mg/dL — ABNORMAL LOW (ref 7.0–30.0)

## 2021-09-24 LAB — ACETAMINOPHEN LEVEL: Acetaminophen (Tylenol), Serum: <10 ug/mL — ABNORMAL LOW (ref 10–30)

## 2021-09-24 LAB — ETHANOL: Alcohol, Ethyl (B): 286 mg/dL — ABNORMAL HIGH (ref <10)

## 2021-09-24 LAB — POC URINE PREG, ED: Preg Test, Ur: NEGATIVE

## 2021-09-24 LAB — LIPASE, BLOOD: Lipase: 53 U/L — ABNORMAL HIGH (ref 11–51)

## 2021-09-24 LAB — TROPONIN I (HIGH SENSITIVITY): Troponin I (High Sensitivity): <2 ng/L (ref <18)

## 2021-09-24 MED ORDER — CHLORDIAZEPOXIDE HCL 25 MG PO CAPS
25.0000 mg | ORAL_CAPSULE | Freq: Four times a day (QID) | ORAL | Status: DC
Start: 2021-09-24 — End: 2021-09-25
  Administered 2021-09-24 – 2021-09-25 (×2): 25 mg via ORAL
  Filled 2021-09-24 (×2): qty 1

## 2021-09-24 MED ORDER — CHLORDIAZEPOXIDE HCL 25 MG PO CAPS: 25.0000 mg | ORAL_CAPSULE | Freq: Every day | ORAL | Status: DC

## 2021-09-24 MED ORDER — ONDANSETRON HCL 4 MG/2ML IJ SOLN
4.0000 mg | Freq: Once | INTRAMUSCULAR | Status: AC
  Administered 2021-09-24: 4 mg via INTRAVENOUS
  Filled 2021-09-24: qty 2

## 2021-09-24 MED ORDER — DIPHENHYDRAMINE HCL 50 MG/ML IJ SOLN: 50.0000 mg | Freq: Once | INTRAMUSCULAR | Status: AC

## 2021-09-24 MED ORDER — LORAZEPAM 1 MG PO TABS: 1.0000 mg | ORAL_TABLET | Freq: Once | ORAL | Status: DC

## 2021-09-24 MED ORDER — DIPHENHYDRAMINE HCL 25 MG PO CAPS
50.0000 mg | ORAL_CAPSULE | Freq: Once | ORAL | Status: AC
  Administered 2021-09-24: 50 mg via ORAL
  Filled 2021-09-24: qty 2

## 2021-09-24 MED ORDER — CHLORDIAZEPOXIDE HCL 25 MG PO CAPS
25.0000 mg | ORAL_CAPSULE | Freq: Once | ORAL | Status: AC
  Administered 2021-09-24: 25 mg via ORAL
  Filled 2021-09-24: qty 1

## 2021-09-24 MED ORDER — THIAMINE HCL 100 MG PO TABS
100.0000 mg | ORAL_TABLET | Freq: Every day | ORAL | Status: DC
  Administered 2021-09-25: 100 mg via ORAL
  Filled 2021-09-24: qty 1

## 2021-09-24 MED ORDER — ONDANSETRON 4 MG PO TBDP: 4.0000 mg | ORAL_TABLET | Freq: Four times a day (QID) | ORAL | Status: DC | PRN

## 2021-09-24 MED ORDER — THIAMINE HCL 100 MG/ML IJ SOLN
100.0000 mg | Freq: Once | INTRAMUSCULAR | Status: AC
  Administered 2021-09-24: 100 mg via INTRAMUSCULAR
  Filled 2021-09-24: qty 2

## 2021-09-24 MED ORDER — GABAPENTIN 100 MG PO CAPS
200.0000 mg | ORAL_CAPSULE | Freq: Two times a day (BID) | ORAL | Status: DC
  Administered 2021-09-24 – 2021-09-25 (×2): 200 mg via ORAL
  Filled 2021-09-24 (×2): qty 2

## 2021-09-24 MED ORDER — ALBUTEROL SULFATE HFA 108 (90 BASE) MCG/ACT IN AERS
2.0000 | INHALATION_SPRAY | Freq: Four times a day (QID) | RESPIRATORY_TRACT | Status: DC | PRN
  Filled 2021-09-24: qty 6.7

## 2021-09-24 MED ORDER — HYDROCORTISONE SOD SUC (PF) 250 MG IJ SOLR
200.0000 mg | Freq: Once | INTRAMUSCULAR | Status: AC
  Administered 2021-09-24: 200 mg via INTRAVENOUS
  Filled 2021-09-24: qty 200

## 2021-09-24 MED ORDER — IOHEXOL 350 MG/ML SOLN
75.0000 mL | Freq: Once | INTRAVENOUS | Status: AC | PRN
  Administered 2021-09-24: 75 mL via INTRAVENOUS

## 2021-09-24 MED ORDER — PANTOPRAZOLE SODIUM 40 MG PO TBEC
40.0000 mg | DELAYED_RELEASE_TABLET | Freq: Every day | ORAL | Status: DC
  Administered 2021-09-24 – 2021-09-25 (×2): 40 mg via ORAL
  Filled 2021-09-24 (×2): qty 1

## 2021-09-24 MED ORDER — CHLORDIAZEPOXIDE HCL 25 MG PO CAPS: 25.0000 mg | ORAL_CAPSULE | ORAL | Status: DC

## 2021-09-24 MED ORDER — CHLORDIAZEPOXIDE HCL 25 MG PO CAPS: 25.0000 mg | ORAL_CAPSULE | Freq: Three times a day (TID) | ORAL | Status: DC

## 2021-09-24 MED ORDER — ADULT MULTIVITAMIN W/MINERALS CH
1.0000 | ORAL_TABLET | Freq: Every day | ORAL | Status: DC
  Administered 2021-09-24 – 2021-09-25 (×2): 1 via ORAL
  Filled 2021-09-24 (×2): qty 1

## 2021-09-24 MED ORDER — LOPERAMIDE HCL 2 MG PO CAPS: 2.0000 mg | ORAL_CAPSULE | ORAL | Status: DC | PRN

## 2021-09-24 MED ORDER — HYDROXYZINE HCL 25 MG PO TABS: 25.0000 mg | ORAL_TABLET | Freq: Four times a day (QID) | ORAL | Status: DC | PRN

## 2021-09-24 MED ORDER — TRAZODONE HCL 50 MG PO TABS: 50.0000 mg | ORAL_TABLET | Freq: Every evening | ORAL | Status: DC | PRN

## 2021-09-24 MED ORDER — CHLORDIAZEPOXIDE HCL 25 MG PO CAPS: 25.0000 mg | ORAL_CAPSULE | Freq: Four times a day (QID) | ORAL | Status: DC | PRN

## 2021-09-24 NOTE — ED Notes (Signed)
Dinner tray provided

## 2021-09-24 NOTE — ED Notes (Signed)
Pt vomited large amount of undigested food. Given clean blanket. Will give zofran for nausea.

## 2021-09-24 NOTE — ED Provider Notes (Addendum)
[facility] Emergency Department Provider Note   ____________________________________________   Event Date/Time   First MD Initiated Contact with Patient 09/24/21 1340     (approximate)  I have reviewed the triage vital signs and the nursing notes.   HISTORY  Chief Complaint Suicidal    HP Katrina Turner is a 40 y.o. female who comes in saying she will cut her throat or slit her jugular because her husband keeps yelling at her and she cannot take it anymore.  She also complains of a "sore on her butt" and pain in her belly where she had her Roux-en-Y for weight loss and lysis of adhesions and mesh put in which is worse than usual.  It is dull achy pain up in the belly of its been going on for quite some time but the last again several days has been worse than usual.  She complains about 2 days of pain in the right mid back worse with respirations and not over the spine but more in the ribs posteriorly.  She is not running a fever or coughing or feeling short of breath.  Both pains are moderate 1 and the chest is sharpish.  Patient required ports she has peripheral neuropathy and has numbness in the legs and feet and hands.  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         Past Medical History:  Diagnosis Date   Allergy    Bipolar 1 disorder (HCC)    Depression    GERD (gastroesophageal reflux disease)    Neuromuscular disorder (HCC)    Pancreatitis 11/28/2019   approximated    PTSD (post-traumatic stress disorder)    Reynolds syndrome [facility])     Patient Active Problem List   Diagnosis Date Noted   Substance induced mood disorder (HCC) 09/22/2021   Polysubstance abuse (HCC)    Encounter to establish care 09/05/2021   History of anxiety 09/05/2021   Alcoholic intoxication without complication (HCC) 02/16/2021   GI bleed 10/16/2020   Essential hypertension 10/16/2020   Low back pain radiating to both legs 08/08/2020   Headache  disorder 07/19/2020   Numbness and tingling of both feet 07/19/2020   Low hemoglobin 07/11/2020   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   Hot flashes 06/05/2020   Full dentures 06/05/2020   Chronic pancreatitis (HCC) 06/05/2020   Chronic migraine without aura without status migrainosus, not intractable 06/05/2020   Seasonal allergies 06/05/2020   High risk medication use 06/05/2020   Personality disorder (HCC) 06/05/2020   Urinary symptom or sign 06/05/2020   Skin lesion 06/05/2020   Alcohol abuse 06/27/2019   Bipolar 1 disorder (HCC) 05/24/2019   H/O gastric bypass 05/24/2019   Acute pancreatitis 05/24/2019   Alcohol use 05/24/2019   Tobacco use disorder 03/04/2019   Abdominal pain 03/03/2019   Lupus (HCC) 03/03/2019   Raynaud's phenomenon without gangrene 03/03/2019    Past Surgical History:  Procedure Laterality Date   APPENDECTOMY     CESAREAN SECTION     ESOPHAGOGASTRODUODENOSCOPY N/A 10/17/2020   Procedure: ESOPHAGOGASTRODUODENOSCOPY (EGD);  Surgeon: Toledo, Boykin Nearing, MD;  Location: [facility] ENDOSCOPY;  Service: Gastroenterology;  Laterality: N/A;   HERNIA REPAIR     PARTIAL HYSTERECTOMY     ovaries present not uterus- precancerous cells of uterus    ROUX-EN-Y GASTRIC BYPASS      Prior to Admission medications   Medication Sig Start Date End Date Taking? Authorizing Provider  SUMAtriptan (IMITREX) 50 MG tablet  Take 1 tablet (50 mg total) by mouth once daily for 1 dose. May repeat in 2 hours if headache persists or recurs. (Max of 2 doses in 24 hours). 09/05/21 09/24/21 Yes Iloabachie, Chioma E, NP  albuterol (VENTOLIN HFA) 108 (90 Base) MCG/ACT inhaler Inhale 1-2 puffs into the lungs every 6 (six) hours as needed for wheezing or shortness of breath (excercise induced asthma). 09/05/21   Iloabachie, Chioma E, NP  cetirizine (ZYRTEC ALLERGY) 10 MG tablet Take 1 tablet (10 mg total) by mouth once daily. 09/05/21   Iloabachie, Chioma E, NP  DULoxetine (CYMBALTA) 20 MG  capsule Take 1 capsule (20 mg total) by mouth daily. 02/12/21 03/14/21  Flinchum, Eula Fried, FNP  gabapentin (NEURONTIN) 100 MG capsule Take 2 capsules (200 mg total) by mouth 2 (two) times daily. 09/05/21   Iloabachie, Chioma E, NP  pantoprazole (PROTONIX) 40 MG tablet Take 1 tablet (40 mg total) by mouth once daily. 09/05/21   Iloabachie, Chioma E, NP  traZODone (DESYREL) 50 MG tablet Take 0.5 tablets (25 mg total) by mouth at bedtime. Patient not taking: Reported on 09/05/2021 02/12/21   Flinchum, Eula Fried, FNP    Allergies Depakote er Mliss Sax sodium er], Other, Bee pollen, Bee venom, Carbamazepine, Iodinated diagnostic agents, and Nsaids  Family History  Problem Relation Age of Onset   Heart attack Mother    Diabetes Father    Heart disease Father    Schizophrenia Brother    Autism Son    Pancreatic cancer Maternal Grandmother    Heart disease Maternal Grandfather    Seizures Paternal Grandmother    Heart disease Paternal Grandfather    Prostate cancer Paternal Grandfather     Social History Social History   Tobacco Use   Smoking status: Every Day    Packs/day: 0.50    Years: 7.00    Pack years: 3.50    Types: Cigarettes   Smokeless tobacco: Never  Vaping Use   Vaping Use: Never used  Substance Use Topics   Alcohol use: Yes    Alcohol/week: 14.0 standard drinks    Types: 14 Cans of beer per week    Comment: pt states a qt. a day   Drug use: Never    Review of Systems  Constitutional: No fever/chills Eyes: No visual changes. ENT: No sore throat. Cardiovascular: Posterior chest pain. Respiratory: Denies shortness of breath. Gastrointestinal: abdominal pain.  No nausea, no vomiting.  No diarrhea.  No constipation. Genitourinary: Negative for dysuria. Musculoskeletal: Patient did not report back pain. Skin: Negative for rash. Neurological: Negative for headaches, focal weakness although she needs a walker to help her get around because her legs are not working  right she says.  ____________________________________________   PHYSICAL EXAM:  VITAL SIGNS: ED Triage Vitals [09/24/21 1256]  Enc Vitals Group     BP (!) 139/102     Pulse Rate (!) 57     Resp 18     Temp 97.6 F (36.4 C)     Temp Source Oral     SpO2 100 %     Weight      Height      Head Circumference      Peak Flow      Pain Score      Pain Loc      Pain Edu?      Excl. in GC?     Constitutional: Alert and oriented. Well appearing and in no acute distress. Eyes: Conjunctivae are normal.  Head:  Atraumatic. Nose: No congestion/rhinnorhea. Mouth/Throat: Mucous membranes are moist.  Oropharynx non-erythematous. Neck: No stridor.  Cardiovascular: Normal rate, regular rhythm. Grossly normal heart sounds.  Good peripheral circulation. Respiratory: Normal respiratory effort.  No retractions. Lungs CTAB. Gastrointestinal: Soft and mildly diffusely tender no distention. No abdominal bruits.  Musculoskeletal: No lower extremity tenderness nor edema.   Neurologic:  Normal speech and language. No new gross focal neurologic deficits are appreciated.  Patient reports numbness is as noted Skin:  Skin is warm, dry and intact. No rash noted.  The only exception to this is over the patient's sacrum where she has what appears to be a stage I decubitus with some erythema   ____________________________________________   LABS (all labs ordered are listed, but only abnormal results are displayed)  Labs Reviewed  COMPREHENSIVE METABOLIC PANEL - Abnormal; Notable for the following components:      Result Value   Alkaline Phosphatase 141 (*)    All other components within normal limits  ETHANOL - Abnormal; Notable for the following components:   Alcohol, Ethyl (B) 286 (*)    All other components within normal limits  SALICYLATE LEVEL - Abnormal; Notable for the following components:   Salicylate Lvl <7.0 (*)    All other components within normal limits  ACETAMINOPHEN LEVEL -  Abnormal; Notable for the following components:   Acetaminophen (Tylenol), Serum <10 (*)    All other components within normal limits  CBC - Abnormal; Notable for the following components:   RDW 18.4 (*)    All other components within normal limits  LIPASE, BLOOD - Abnormal; Notable for the following components:   Lipase 53 (*)    All other components within normal limits  URINE DRUG SCREEN, QUALITATIVE ([facility] ONLY)  D-DIMER, QUANTITATIVE  POC URINE PREG, ED   ____________________________________________  EKG  EKG read interpreted by me shows what I believe is normal sinus rhythm at 69 the computer is reading a flutter there are some irregular waves in V3 but only in V3 I am not convinced they are not artifact.  There does appear to be some ST segment elevation in lead 5 only. ____________________________________________  RADIOLOGY Jill Poling, personally viewed and evaluated these images (plain radiographs) as part of my medical decision making, as well as reviewing the written report by the radiologist.  ED MD interpretation:    Official radiology report(s): No results found.  ____________________________________________   PROCEDURES  Procedure(s) performed (including Critical Care):  Procedures   ____________________________________________   INITIAL IMPRESSION / ASSESSMENT AND PLAN / ED COURSE  As part of my medical decision making, I reviewed the following data within the electronic MEDICAL RECORD NUMBER  Previous ER visits are reviewed.  Labs have been reviewed as well.  I will have to order a CT scan to check on her belly as she has a history of enough problems that she could have some potentially bad pathology.  I am going to wait to do that till I get the D-dimer so if it is positive we can do the CT of the chest to rule out PE at the same time using the same contrast.  Going to sign this patient out to the oncoming physician. EKG was also read by me.          ____________________________________________   FINAL CLINICAL IMPRESSION(S) / ED DIAGNOSES  Final diagnoses:  Suicidal ideation  Depression, unspecified depression type  Generalized abdominal pain  Pleuritic chest pain  ED Discharge Orders     None        Note:  This document was prepared using Dragon voice recognition software and may include unintentional dictation errors.    Arnaldo Natal, MD 09/24/21 1545    Arnaldo Natal, MD 09/24/21 [phone]
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
## 2021-09-24 NOTE — ED Notes (Signed)
VOL  CONSULT  DONE  PENDING  PLACEMENT 

## 2021-09-24 NOTE — ED Notes (Signed)
Lunch tray given. 

## 2021-09-24 NOTE — ED Notes (Addendum)
Pt reports she had hernia surgery in 2008 which dehiscence and she had to go back for more surgery. Pt c/o of chronic pain to abdominal area since the repair.

## 2021-09-24 NOTE — ED Provider Notes (Signed)
  Clinical Course as of 09/24/21 2225  Tue Sep 24, 2021  1510 Pt received in signout from Dr. Darnelle Catalan pending D-dimer. Anticipate CT abd, possibly CTA chest [DS]  1615 Dimer elevated. Will order CTA chest. She unfortunately has a documented contrast allergy, we will pursue Emergency protocols with steroids and benadryl to facilitate imaging [DS]  2223 Cts unremarkable. We will discuss w psych [DS]  2224 The patient has been placed in psychiatric observation due to the need to provide a safe environment for the patient while obtaining psychiatric consultation and evaluation, as well as ongoing medical and medication management to treat the patient's condition.  The patient has not been placed under full IVC at this time.   [DS]    Clinical Course User Index [DS] Delton Prairie, MD      Delton Prairie, MD 09/24/21 2225
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
## 2021-09-24 NOTE — ED Notes (Signed)
This tech and Dorothy,RN assisted pt to change into [facility] scrubs. Belongings included Green sweater, blue jeans, white shirt, pink sport bra, brown boots, gray socks, and black small bag.
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
## 2021-09-24 NOTE — ED Notes (Signed)
Pt lying in bed with eyes open; calm, cooperative. Pt states "I feel anxious." Pt c/o of "just the regular pain" referring to her stomach and back; she rates pain 7-10 on 0-10 pain scale; pt requests pain medication. Pt denies SI/HI/AVH at this time. Pt says that her sleep is "eh" and states "I need to be knocked out"; pt requested sleep medication. Pt describes her appetite as "good" and says "I'm hungry all the time." Pt expresses feelings of anxiety and depression "because I'm her and I don't know how my husband is at home with my dog." Pt  requested and provided denture cream. No acute distress noted.

## 2021-09-25 ENCOUNTER — Telehealth: Payer: Self-pay | Admitting: Pharmacy Technician

## 2021-09-25 NOTE — BH Assessment (Signed)
No current beds available at [facility]  Referral information for Psychiatric Hospitalization faxed to;   [facility] [phone]) Unable to accept patients at this time  Katrina Katrina ([phone]),   [address] [phone]),   Katrina Katrina [phone]).

## 2021-09-25 NOTE — ED Provider Notes (Signed)
Emergency Medicine Observation Re-evaluation Note  Katrina Turner is a 40 y.o. female, seen on rounds today.  Pt initially presented to the ED for complaints of Suicidal Currently, the patient is resting, voices no medical complaints.  Physical Exam  BP (!) 148/105 (BP Location: Left Arm)   Pulse 90   Temp 98.8 F (37.1 C) (Oral)   Resp 18   SpO2 95%  Physical Exam General: Resting in no acute distress Cardiac: No cyanosis Lungs: Equal rise and fall Psych: Not agitated  ED Course / MDM  EKG:   I have reviewed the labs performed to date as well as medications administered while in observation.  Recent changes in the last 24 hours include no events overnight.  Plan  Current plan is for psychiatric disposition.  Katrina Turner is not under involuntary commitment.     Katrina Hong, MD 09/25/21 [phone]

## 2021-09-25 NOTE — Consult Note (Signed)
  Patient states that she is feeling much better today and wants to be discharged. She spoke with [facility] representative this morning and states that she will be following up with them for outpatient treatment. She is alert and oriented x 4. She denies suicidal or homicidal ideation, paranoia, auditory or visual hallucinations. Patient states that her husband is a good support for her and she wants to be with him.  Patient is not tearful, speaks in linear sentences. Does not appear to be responding to internal stimuli. Patient does not meet criteria for IVC. Discussed with Dr. Toni Amend. Patient will be discharged.

## 2021-09-25 NOTE — Telephone Encounter (Signed)
Patient stated that she is working with Unk P[place], peer support-[facility] to help obtain housing through [facility].  Sherilyn Dacosta Care Manager [facility]

## 2021-09-25 NOTE — ED Notes (Signed)
Meal given to pt.

## 2021-09-25 NOTE — ED Notes (Signed)
Pt given snack. 
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
## 2021-09-25 NOTE — ED Notes (Signed)
E-signature not working at this time. Pt verbalized understanding of D/C instructions, prescriptions and follow up care with no further questions at this time. Pt in NAD and ambulatory at time of D/C.  

## 2021-09-26 ENCOUNTER — Other Ambulatory Visit: Payer: Self-pay

## 2021-09-26 ENCOUNTER — Ambulatory Visit: Payer: Medicaid Other | Admitting: Obstetrics and Gynecology

## 2021-09-26 VITALS — BP 129/95 | HR 128 | Ht 66.5 in | Wt 146.6 lb

## 2021-09-26 DIAGNOSIS — G629 Polyneuropathy, unspecified: Secondary | ICD-10-CM

## 2021-09-26 DIAGNOSIS — G4700 Insomnia, unspecified: Secondary | ICD-10-CM | POA: Insufficient documentation

## 2021-09-26 MED ORDER — TRAZODONE HCL 50 MG PO TABS
25.0000 mg | ORAL_TABLET | Freq: Every day | ORAL | 0 refills | Status: DC
  Filled 2021-09-26: qty 15, 30d supply, fill #0

## 2021-09-26 NOTE — Progress Notes (Signed)
Pcp, No   Chief Complaint  Patient presents with   Insomnia   Numbness    Can't sleep, and not sleeping makes her want to reach for alcohol but she wants to resist that and stay healthy. Numbness in all extremities and she saw a neurologist in April at [place] for this. Says she is ready to quit smoking. Does not want narcotics but wants something for her chronic pain but capsaicin did not work in past.    HPI:      Ms. Katrina Turner is a 40 y.o. No obstetric history on file. whose LMP was No LMP recorded. Patient has had a hysterectomy., presents today for numbness and pain in hands/feet/LE, hx of Raynauds. Was doing nerve testing with neuro.  Sx for 7 yrs. Sx increased in past 1 yr. Drinks a case of beer a day for 2 yrs. Sx improved if doesn't drink as much. Not much improvement with gabapentin and sometimes feels dizzy with it. Used to get B12 injections, not taking MVI regularly. S/p gastric bypass in 2009. No recent B12 and folate labs but given B12 injection yesterday at [facility] ED.  Insomnia--trouble staying asleep and falling asleep. Melatonin and benadryl 3 times nightly without relief. Drinking a case of beer nightly. Sleeps well with trazodone. Would like Rx RF.   Hx of mental health disorders, recent SI. Went to ED. Has intake assessment with [facility] tomorrow.    Past Medical History:  Diagnosis Date   Allergy    Bipolar 1 disorder (HCC)    Depression    GERD (gastroesophageal reflux disease)    Neuromuscular disorder (HCC)    Pancreatitis 11/28/2019   approximated    Paranoid schizophrenia (HCC)    PTSD (post-traumatic stress disorder)    Reynolds syndrome [facility])     Past Surgical History:  Procedure Laterality Date   APPENDECTOMY     CESAREAN SECTION     ESOPHAGOGASTRODUODENOSCOPY N/A 10/17/2020   Procedure: ESOPHAGOGASTRODUODENOSCOPY (EGD);  Surgeon: Toledo, Boykin Nearing, MD;  Location: [facility] ENDOSCOPY;  Service: Gastroenterology;  Laterality: N/A;   HERNIA REPAIR      PARTIAL HYSTERECTOMY     ovaries present not uterus- precancerous cells of uterus    ROUX-EN-Y GASTRIC BYPASS      Family History  Problem Relation Age of Onset   Heart attack Mother    Diabetes Father    Heart disease Father    Schizophrenia Brother    Autism Son    Pancreatic cancer Maternal Grandmother    Heart disease Maternal Grandfather    Seizures Paternal Grandmother    Heart disease Paternal Grandfather    Prostate cancer Paternal Grandfather     Social History   Socioeconomic History   Marital status: Married    Spouse name: [name]   Number of children: 1   Years of education: Not on file   Highest education level: Not on file  Occupational History   Not on file  Tobacco Use   Smoking status: Every Day    Packs/day: 0.50    Years: 7.00    Pack years: 3.50    Types: Cigarettes   Smokeless tobacco: Never  Vaping Use   Vaping Use: Never used  Substance and Sexual Activity   Alcohol use: Yes    Alcohol/week: 14.0 standard drinks    Types: 14 Cans of beer per week    Comment: pt states a qt. a day   Drug use: Never   Sexual activity: Yes  Other Topics Concern   Not on file  Social History Narrative   Not on file   Social Determinants of Health   Financial Resource Strain: Not on file  Food Insecurity: No Food Insecurity   Worried About Running Out of Food in the Last Year: Never true   Ran Out of Food in the Last Year: Never true  Transportation Needs: No Transportation Needs   Lack of Transportation (Medical): No   Lack of Transportation (Non-Medical): No  Physical Activity: Not on file  Stress: Not on file  Social Connections: Not on file  Intimate Partner Violence: Not on file    Outpatient Medications Prior to Visit  Medication Sig Dispense Refill   albuterol (VENTOLIN HFA) 108 (90 Base) MCG/ACT inhaler Inhale 1-2 puffs into the lungs every 6 (six) hours as needed for wheezing or shortness of breath (excercise induced asthma). 6.7 g 2    cetirizine (ZYRTEC ALLERGY) 10 MG tablet Take 1 tablet (10 mg total) by mouth once daily. 30 tablet 2   gabapentin (NEURONTIN) 100 MG capsule Take 2 capsules (200 mg total) by mouth 2 (two) times daily. 120 capsule 0   pantoprazole (PROTONIX) 40 MG tablet Take 1 tablet (40 mg total) by mouth once daily. 30 tablet 0   DULoxetine (CYMBALTA) 20 MG capsule Take 1 capsule (20 mg total) by mouth daily. 30 capsule 0   SUMAtriptan (IMITREX) 50 MG tablet Take 1 tablet (50 mg total) by mouth once daily for 1 dose. May repeat in 2 hours if headache persists or recurs. (Max of 2 doses in 24 hours). 10 tablet 0   traZODone (DESYREL) 50 MG tablet Take 0.5 tablets (25 mg total) by mouth at bedtime. (Patient not taking: No sig reported) 15 tablet 0   No facility-administered medications prior to visit.      ROS:  Review of Systems  Constitutional: Negative.   Respiratory: Negative.    Musculoskeletal:  Positive for myalgias.  Neurological:  Positive for numbness.  Psychiatric/Behavioral:  Positive for behavioral problems, sleep disturbance and suicidal ideas. The patient is hyperactive.   BREAST: No symptoms   OBJECTIVE:   Vitals:  BP (!) 129/95   Pulse (!) 128   Ht 5' 6.5" (1.689 m)   Wt 146 lb 9.6 oz (66.5 kg)   SpO2 97%   BMI 23.31 kg/m   Physical Exam Vitals reviewed.  Constitutional:      Appearance: Normal appearance. She is well-developed.  Pulmonary:     Effort: Pulmonary effort is normal.  Musculoskeletal:        General: Normal range of motion.     Cervical back: Normal range of motion.  Skin:    General: Skin is warm and dry.  Neurological:     General: No focal deficit present.     Mental Status: She is alert and oriented to person, place, and time.     Cranial Nerves: No cranial nerve deficit.  Psychiatric:        Mood and Affect: Mood normal.        Behavior: Behavior normal.        Thought Content: Thought content normal.        Judgment: Judgment normal.     Assessment/Plan: Insomnia, unspecified type - Plan: traZODone (DESYREL) 50 MG tablet; Rx RF trazodone. D/C alcohol. F/u with [facility] for mgmt  Neuropathy - Plan: B12 and Folate Panel; sx not improved with gabapentin. Check labs, d/c alcohol use. Can f/u with neuro prn.  Meds ordered this encounter  Medications   traZODone (DESYREL) 50 MG tablet    Sig: Take 0.5 tablets (25 mg total) by mouth at bedtime.    Dispense:  15 tablet    Refill:  0    Order Specific Question:   Supervising Provider    Answer:   Nadara Mustard [[id number]]      Return if symptoms worsen or fail to improve.  Leler Brion B. Emila Steinhauser, PA-C 09/26/2021 8:01 PM

## 2021-09-27 ENCOUNTER — Other Ambulatory Visit: Payer: Self-pay

## 2021-10-02 ENCOUNTER — Other Ambulatory Visit: Payer: Medicaid Other

## 2021-10-03 ENCOUNTER — Ambulatory Visit: Payer: Medicaid Other | Admitting: Gerontology

## 2021-10-08 ENCOUNTER — Institutional Professional Consult (permissible substitution): Payer: Medicaid Other | Admitting: Licensed Clinical Social Worker

## 2021-10-17 ENCOUNTER — Other Ambulatory Visit: Payer: Self-pay

## 2021-10-27 ENCOUNTER — Emergency Department
Admission: EM | Admit: 2021-10-27 | Discharge: 2021-10-27 | Disposition: A | Discharge status: Home / Self Care | Payer: Medicaid Other | Attending: Emergency Medicine | Admitting: Emergency Medicine

## 2021-10-27 ENCOUNTER — Other Ambulatory Visit: Payer: Self-pay

## 2021-10-27 DIAGNOSIS — F10920 Alcohol use, unspecified with intoxication, uncomplicated: Secondary | ICD-10-CM

## 2021-10-27 DIAGNOSIS — F10120 Alcohol abuse with intoxication, uncomplicated: Secondary | ICD-10-CM | POA: Insufficient documentation

## 2021-10-27 DIAGNOSIS — Y908 Blood alcohol level of 240 mg/100 ml or more: Secondary | ICD-10-CM | POA: Insufficient documentation

## 2021-10-27 DIAGNOSIS — F17210 Nicotine dependence, cigarettes, uncomplicated: Secondary | ICD-10-CM | POA: Insufficient documentation

## 2021-10-27 DIAGNOSIS — I10 Essential (primary) hypertension: Secondary | ICD-10-CM | POA: Insufficient documentation

## 2021-10-27 DIAGNOSIS — U071 COVID-19: Secondary | ICD-10-CM | POA: Insufficient documentation

## 2021-10-27 DIAGNOSIS — Z79899 Other long term (current) drug therapy: Secondary | ICD-10-CM | POA: Insufficient documentation

## 2021-10-27 LAB — CBC WITH DIFFERENTIAL/PLATELET
Abs Immature Granulocytes: 0.02 10*3/uL (ref 0.00–0.07)
Basophils Absolute: 0.1 10*3/uL (ref 0.0–0.1)
Basophils Relative: 1 %
Eosinophils Absolute: 0.1 10*3/uL (ref 0.0–0.5)
Eosinophils Relative: 2 %
HCT: 38.6 % (ref 36.0–46.0)
Hemoglobin: 12.5 g/dL (ref 12.0–15.0)
Immature Granulocytes: 0 %
Lymphocytes Relative: 32 %
Lymphs Abs: 2 10*3/uL (ref 0.7–4.0)
MCH: 28.2 pg (ref 26.0–34.0)
MCHC: 32.4 g/dL (ref 30.0–36.0)
MCV: 86.9 fL (ref 80.0–100.0)
Monocytes Absolute: 0.5 10*3/uL (ref 0.1–1.0)
Monocytes Relative: 8 %
Neutro Abs: 3.5 10*3/uL (ref 1.7–7.7)
Neutrophils Relative %: 57 %
Platelets: 347 10*3/uL (ref 150–400)
RBC: 4.44 MIL/uL (ref 3.87–5.11)
RDW: 20 % — ABNORMAL HIGH (ref 11.5–15.5)
WBC: 6.2 10*3/uL (ref 4.0–10.5)
nRBC: 0 % (ref 0.0–0.2)

## 2021-10-27 LAB — COMPREHENSIVE METABOLIC PANEL
ALT: 13 U/L (ref 0–44)
AST: 22 U/L (ref 15–41)
Albumin: 3.5 g/dL (ref 3.5–5.0)
Alkaline Phosphatase: 117 U/L (ref 38–126)
Anion gap: 9 (ref 5–15)
BUN: 14 mg/dL (ref 6–20)
CO2: 25 mmol/L (ref 22–32)
Calcium: 8.5 mg/dL — ABNORMAL LOW (ref 8.9–10.3)
Chloride: 102 mmol/L (ref 98–111)
Creatinine, Ser: 0.7 mg/dL (ref 0.44–1.00)
GFR, Estimated: >60 mL/min (ref >60)
Glucose, Bld: 89 mg/dL (ref 70–99)
Potassium: 4.6 mmol/L (ref 3.5–5.1)
Sodium: 136 mmol/L (ref 135–145)
Total Bilirubin: 0.3 mg/dL (ref 0.3–1.2)
Total Protein: 7.6 g/dL (ref 6.5–8.1)

## 2021-10-27 LAB — RESP PANEL BY RT-PCR (FLU A&B, COVID) ARPGX2
Influenza A by PCR: NEGATIVE
Influenza B by PCR: NEGATIVE
SARS Coronavirus 2 by RT PCR: POSITIVE — AB

## 2021-10-27 LAB — URINE DRUG SCREEN, QUALITATIVE (ARMC ONLY)
Amphetamines, Ur Screen: NOT DETECTED
Barbiturates, Ur Screen: NOT DETECTED
Benzodiazepine, Ur Scrn: NOT DETECTED
Cannabinoid 50 Ng, Ur ~~LOC~~: NOT DETECTED
Cocaine Metabolite,Ur ~~LOC~~: NOT DETECTED
MDMA (Ecstasy)Ur Screen: NOT DETECTED
Methadone Scn, Ur: NOT DETECTED
Opiate, Ur Screen: NOT DETECTED
Phencyclidine (PCP) Ur S: NOT DETECTED
Tricyclic, Ur Screen: POSITIVE — AB

## 2021-10-27 LAB — ETHANOL: Alcohol, Ethyl (B): 449 mg/dL (ref <10)

## 2021-10-27 LAB — POC URINE PREG, ED: Preg Test, Ur: NEGATIVE

## 2021-10-27 LAB — MAGNESIUM: Magnesium: 1.7 mg/dL (ref 1.7–2.4)

## 2021-10-27 NOTE — ED Triage Notes (Signed)
Pt husband [name], husband requesting to visit, advised to check back later

## 2021-10-27 NOTE — ED Notes (Signed)
Pt dressed out :  Black socks  yellow socks Black jeans Purple sports bra Black shirt Black shoes Yellow necklace

## 2021-10-27 NOTE — ED Notes (Signed)
Pt requesting to leave. Pt given clothes back. Pt able to dress herself. Pt refused DC vitals. Pt refused to sign DC form. Pt wheeled to lobby.
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
## 2021-10-27 NOTE — ED Notes (Signed)
Pt positive for COVID. So moved pt to room with door for isolation.
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
## 2021-10-27 NOTE — ED Notes (Signed)
Pt given sandwich tray 

## 2021-11-07 ENCOUNTER — Encounter: Payer: Self-pay | Admitting: Emergency Medicine

## 2021-11-07 DIAGNOSIS — Z0471 Encounter for examination and observation following alleged adult physical abuse: Secondary | ICD-10-CM | POA: Insufficient documentation

## 2021-11-07 DIAGNOSIS — Z5321 Procedure and treatment not carried out due to patient leaving prior to being seen by health care provider: Secondary | ICD-10-CM | POA: Insufficient documentation
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
## 2021-11-07 NOTE — ED Notes (Signed)
Attempted to call pt's husband, [name]
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
## 2021-11-08 ENCOUNTER — Emergency Department
Admission: EM | Admit: 2021-11-08 | Discharge: 2021-11-08 | Disposition: A | Discharge status: Home / Self Care | Payer: Medicaid Other | Attending: Emergency Medicine | Admitting: Emergency Medicine

## 2021-11-08 NOTE — ED Notes (Signed)
Pt states that she does not need to be seen by a provider at this time. Pt encouraged to stay in her room & wait to be seen by the provider.

## 2021-11-13 ENCOUNTER — Ambulatory Visit: Payer: Medicaid Other | Admitting: Gastroenterology

## 2021-11-13 ENCOUNTER — Telehealth: Payer: Self-pay

## 2021-11-13 ENCOUNTER — Other Ambulatory Visit: Payer: Self-pay

## 2021-11-13 NOTE — Telephone Encounter (Signed)
Pt called seeking refills on her gabapentin and albuterol inhaler. We stated we cannot refill these until she completes her paperwork for eligibility. She has also cancelled her previous appointments. No refills will be given until patient follows up.

## 2021-11-15 ENCOUNTER — Other Ambulatory Visit: Payer: Self-pay

## 2021-11-26 ENCOUNTER — Other Ambulatory Visit: Payer: Self-pay

## 2021-12-08 ENCOUNTER — Emergency Department: Payer: BC Managed Care – PPO

## 2021-12-08 ENCOUNTER — Emergency Department
Admission: EM | Admit: 2021-12-08 | Discharge: 2021-12-08 | Disposition: A | Discharge status: Home / Self Care | Payer: BC Managed Care – PPO | Attending: Student in an Organized Health Care Education/Training Program | Admitting: Student in an Organized Health Care Education/Training Program

## 2021-12-08 ENCOUNTER — Other Ambulatory Visit: Payer: Self-pay

## 2021-12-08 ENCOUNTER — Encounter: Payer: Self-pay | Admitting: Intensive Care

## 2021-12-08 DIAGNOSIS — R1032 Left lower quadrant pain: Secondary | ICD-10-CM | POA: Insufficient documentation

## 2021-12-08 DIAGNOSIS — R1030 Lower abdominal pain, unspecified: Secondary | ICD-10-CM | POA: Diagnosis present

## 2021-12-08 DIAGNOSIS — F10288 Alcohol dependence with other alcohol-induced disorder: Secondary | ICD-10-CM | POA: Insufficient documentation

## 2021-12-08 DIAGNOSIS — N83202 Unspecified ovarian cyst, left side: Secondary | ICD-10-CM

## 2021-12-08 HISTORY — DX: Alcohol abuse, uncomplicated: F10.10

## 2021-12-08 LAB — HCG, QUANTITATIVE, PREGNANCY: hCG, Beta Chain, Quant, S: <1 m[IU]/mL (ref <5)

## 2021-12-08 LAB — URINALYSIS, ROUTINE W REFLEX MICROSCOPIC
Bilirubin Urine: NEGATIVE
Glucose, UA: NEGATIVE mg/dL
Hgb urine dipstick: NEGATIVE
Ketones, ur: NEGATIVE mg/dL
Leukocytes,Ua: NEGATIVE
Nitrite: NEGATIVE
Protein, ur: NEGATIVE mg/dL
Specific Gravity, Urine: 1.002 — ABNORMAL LOW (ref 1.005–1.030)
pH: 6 (ref 5.0–8.0)

## 2021-12-08 LAB — COMPREHENSIVE METABOLIC PANEL
ALT: 12 U/L (ref 0–44)
AST: 23 U/L (ref 15–41)
Albumin: 3.6 g/dL (ref 3.5–5.0)
Alkaline Phosphatase: 96 U/L (ref 38–126)
Anion gap: 10 (ref 5–15)
BUN: 13 mg/dL (ref 6–20)
CO2: 19 mmol/L — ABNORMAL LOW (ref 22–32)
Calcium: 8.9 mg/dL (ref 8.9–10.3)
Chloride: 98 mmol/L (ref 98–111)
Creatinine, Ser: 0.63 mg/dL (ref 0.44–1.00)
GFR, Estimated: >60 mL/min (ref >60)
Glucose, Bld: 74 mg/dL (ref 70–99)
Potassium: 4.1 mmol/L (ref 3.5–5.1)
Sodium: 127 mmol/L — ABNORMAL LOW (ref 135–145)
Total Bilirubin: 0.7 mg/dL (ref 0.3–1.2)
Total Protein: 7.4 g/dL (ref 6.5–8.1)

## 2021-12-08 LAB — CBC
HCT: 35.7 % — ABNORMAL LOW (ref 36.0–46.0)
Hemoglobin: 11.5 g/dL — ABNORMAL LOW (ref 12.0–15.0)
MCH: 27.3 pg (ref 26.0–34.0)
MCHC: 32.2 g/dL (ref 30.0–36.0)
MCV: 84.6 fL (ref 80.0–100.0)
Platelets: 398 10*3/uL (ref 150–400)
RBC: 4.22 MIL/uL (ref 3.87–5.11)
RDW: 20.4 % — ABNORMAL HIGH (ref 11.5–15.5)
WBC: 7.8 10*3/uL (ref 4.0–10.5)
nRBC: 0 % (ref 0.0–0.2)

## 2021-12-08 LAB — LIPASE, BLOOD: Lipase: 34 U/L (ref 11–51)

## 2021-12-08 MED ORDER — ONDANSETRON HCL 4 MG/2ML IJ SOLN
4.0000 mg | Freq: Once | INTRAMUSCULAR | Status: AC
  Administered 2021-12-08: 4 mg via INTRAVENOUS
  Filled 2021-12-08: qty 2

## 2021-12-08 MED ORDER — SODIUM CHLORIDE 0.9 % IV BOLUS
1000.0000 mL | Freq: Once | INTRAVENOUS | Status: AC
  Administered 2021-12-08: 1000 mL via INTRAVENOUS

## 2021-12-08 MED ORDER — SODIUM CHLORIDE 0.9 % IV BOLUS
500.0000 mL | Freq: Once | INTRAVENOUS | Status: AC
  Administered 2021-12-08: 500 mL via INTRAVENOUS

## 2021-12-08 MED ORDER — MORPHINE SULFATE (PF) 4 MG/ML IV SOLN
4.0000 mg | INTRAVENOUS | Status: DC | PRN
  Administered 2021-12-08 (×2): 4 mg via INTRAVENOUS
  Filled 2021-12-08 (×2): qty 1

## 2021-12-08 NOTE — BH Assessment (Signed)
Comprehensive Clinical Assessment (CCA) Screening, Triage and Referral Note  12/08/2021 Katrina Turner [id number]  Chief Complaint:  Chief Complaint  Patient presents with   Abdominal Pain   Visit Diagnosis: Alcohol Use Disorder  Katrina Turner is a 41 year old female who presents to the ER seeking assistance for alcohol detox. She states she was drinking on a daily basis and was trying to return to [facility] for their treatment. However, the husband was unable to verify their insurance. Which is needed for the referral. Husband states he couldn't get the information for the information until tomorrow (12/09/2020).   Writer provide the patient and her husband other options for detox facilities to follow up with. ______________ [facility] [phone]- [phone]),  [address] [phone]),  [facility] [phone] -or- [phone]),  [facility] [phone]) [facility] [phone]) [facility] [phone]) [facility] [phone])    Patient Reported Information How did you hear about [place]? Self  What Is the Reason for Your Visit/Call Today? Came to the ER seeking detox from alcohol.  How Long Has This Been Causing You Problems? 1 wk - 1 month  What Do You Feel Would Help You the Most Today? Alcohol or Drug Use Treatment   Have You Recently Had Any Thoughts About Hurting Yourself? No  Are You Planning to Commit Suicide/Harm Yourself At This time? No   Have you Recently Had Thoughts About Hurting Someone [name]? No  Are You Planning to Harm Someone at This Time? No  Explanation: No data recorded  Have You Used Any Alcohol or Drugs in the Past 24 Hours? Yes  How Long Ago Did You Use Drugs or Alcohol? No data recorded What Did You Use and How Much? Alcohol 12/08/2021   Do You Currently Have a Therapist/Psychiatrist? No  Name of Therapist/Psychiatrist: No data recorded  Have You Been Recently Discharged From Any  Office Practice or Programs? No  Explanation of Discharge From Practice/Program: No data recorded   CCA Screening Triage Referral Assessment Type of Contact: Face-to-Face  Telemedicine Service Delivery:   Is this Initial or Reassessment? No data recorded Date Telepsych consult ordered in [facility]:  No data recorded Time Telepsych consult ordered in [facility]:  No data recorded Location of Assessment: [facility] ED  Provider Location: [facility] ED   Collateral Involvement: Husband   Does Patient Have a Court Appointed Legal Guardian? No data recorded Name and Contact of Legal Guardian: No data recorded If Minor and Not Living with Parent(s), Who has Custody? No data recorded Is CPS involved or ever been involved? Never  Is APS involved or ever been involved? Never   Patient Determined To Be At Risk for Harm To Self or Others Based on Review of Patient Reported Information or Presenting Complaint? No  Method: No data recorded Availability of Means: No data recorded Intent: No data recorded Notification Required: No data recorded Additional Information for Danger to Others Potential: No data recorded Additional Comments for Danger to Others Potential: No data recorded Are There Guns or Other Weapons in Your Home? No data recorded Types of Guns/Weapons: No data recorded Are These Weapons Safely Secured?                            No data recorded Who Could Verify You Are Able To Have These Secured: No data recorded Do You Have any Outstanding Charges, Pending Court Dates, Parole/Probation? No data recorded Contacted To Inform of Risk of Harm To Self  or Others: Family/Significant Other:   Does Patient Present under Involuntary Commitment? No  IVC Papers Initial File Date: No data recorded  [place] of Residence:    Patient Currently Receiving the Following Services: Not Receiving Services   Determination of Need: Emergent (2 hours)   Options For Referral: ED Visit   Discharge  Disposition:    Lilyan Gilford MS, LCAS, [facility], [facility] Therapeutic Triage Specialist 12/08/2021 6:58 PM

## 2021-12-08 NOTE — ED Provider Notes (Signed)
Received patient in signout from Dr. Roxan Hockey  Following up on TTS consultation which is now cared as well as ultrasound left ovary   US PELVIC COMPLETE W TRANSVAGINAL AND TORSION R/O  Result Date: 12/08/2021 CLINICAL DATA:  Left lower quadrant abdominal pain. EXAM: TRANSABDOMINAL AND TRANSVAGINAL ULTRASOUND OF PELVIS DOPPLER ULTRASOUND OF OVARIES TECHNIQUE: Both transabdominal and transvaginal ultrasound examinations of the pelvis were performed. Transabdominal technique was performed for global imaging of the pelvis including uterus, ovaries, adnexal regions, and pelvic cul-de-sac. It was necessary to proceed with endovaginal exam following the transabdominal exam to visualize the adnexa. Color and duplex Doppler ultrasound was utilized to evaluate blood flow to the ovaries. COMPARISON:  None. FINDINGS: Uterus Surgically absent Right ovary Measurements: 4.0 x 3.3 x 3.1 cm = volume: 21.3 mL. A dominant follicle measures 2.7 x 2.4 x 2.7 cm. No additional lesions are present. Left ovary Measurements: 5.5 x 3.3 x 3.8 cm = volume: 35.7 mL. A left ovarian cyst measures 4.1 x 3.2 x 3.0 cm Pulsed Doppler evaluation of both ovaries demonstrates normal low-resistance arterial and venous waveforms. Other findings Trace free fluid is present. IMPRESSION: 1. 4.1 cm left ovarian cyst. No follow-up imaging is recommended. Reference: Radiology 2019 Nov;293(2):359-371 2. 2.7 cm dominant follicle in the right ovary. 3. Otherwise normal sonographic appearance of the ovaries including Doppler analysis. 4. Status post hysterectomy. Electronically Signed   By: Marin Roberts M.D.   On: 12/08/2021 15:57     Left ovarian ultrasound demonstrates cyst.  No evidence of torsion.  Discussed with patient as well as her friend her husband who is with her.  They advised that they will be getting and have health insurance but do not another group member is a just took effect, they plan to call her insurance company and we will set  up follow-up with appropriate gynecologist.  I did recommend they may follow-up with a local gynecologist   She is sitting quite comfortably eating a meal without distress fully awake and alert.  No tremulousness no sweating no evidence of obvious acute withdrawal.   Sharyn Creamer, MD 12/08/21 [phone]

## 2021-12-08 NOTE — ED Notes (Signed)
US at bedside

## 2021-12-08 NOTE — ED Triage Notes (Addendum)
Patient presents with abdominal pain with N/V/D X2 days. Reports she drinks 36-45 beers a day. 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 Husband states "she drinks to get the pain to go away because they wont give her pain medication. I need to speak with a  doctor because she will be leaving here to go straight to rehab you get her into"

## 2021-12-08 NOTE — ED Notes (Signed)
Pt given turkey tray and water  

## 2021-12-08 NOTE — ED Notes (Signed)
Pt up to use bathroom 

## 2021-12-08 NOTE — ED Provider Notes (Addendum)
[facility] Provider Note    Event Date/Time   First MD Initiated Contact with Patient 12/08/21 1240     (approximate)   History   Abdominal Pain   HPI  Katrina Turner is a 41 y.o. female with a history of substance abuse and alcohol dependence as well as history of small bowel obstruction with previous surgeries for lysis of adhesions presents to the ER for lower abdominal pain nausea vomiting.  Feels like her symptoms are similar to when she had previous small bowel obstruction.  States her last drink of alcohol was last night.  Denies any fevers no chest pain or shortness of breath.     Physical Exam   Triage Vital Signs: ED Triage Vitals  Enc Vitals Group     BP 12/08/21 1125 (!) 145/105     Pulse Rate 12/08/21 1125 (!) 119     Resp 12/08/21 1125 20     Temp 12/08/21 1125 99 F (37.2 C)     Temp Source 12/08/21 1125 Oral     SpO2 12/08/21 1125 96 %     Weight 12/08/21 1126 151 lb (68.5 kg)     Height 12/08/21 1126 5\' 7"  (1.702 m)     Head Circumference --      Peak Flow --      Pain Score 12/08/21 1126 10     Pain Loc --      Pain Edu? --      Excl. in GC? --     Most recent vital signs: Vitals:   12/08/21 1125  BP: (!) 145/105  Pulse: (!) 119  Resp: 20  Temp: 99 F (37.2 C)  SpO2: 96%     General: Awake, uncomfortable appearing CV:  Good peripheral perfusion.  Resp:  Normal effort.  Abd:  No distention. Generalized ttp, no guarding or rebound Other:  No tremor   ED Results / Procedures / Treatments   Labs (all labs ordered are listed, but only abnormal results are displayed) Labs Reviewed  COMPREHENSIVE METABOLIC PANEL - Abnormal; Notable for the following components:      Result Value   Sodium 127 (*)    CO2 19 (*)    All other components within normal limits  CBC - Abnormal; Notable for the following components:   Hemoglobin 11.5 (*)    HCT 35.7 (*)    RDW 20.4 (*)    All other components within normal limits   URINALYSIS, ROUTINE W REFLEX MICROSCOPIC - Abnormal; Notable for the following components:   Color, Urine STRAW (*)    APPearance CLEAR (*)    Specific Gravity, Urine 1.002 (*)    All other components within normal limits  LIPASE, BLOOD  HCG, QUANTITATIVE, PREGNANCY     EKG     RADIOLOGY I personally reviewed all radiographic images ordered to evaluate for the above acute complaints and reviewed radiology reports and findings.  These findings were personally discussed with the patient.  Please see medical record for radiology report.    PROCEDURES:  Critical Care performed: No  Procedures   MEDICATIONS ORDERED IN ED: Medications  morphine 4 MG/ML injection 4 mg (4 mg Intravenous Given 12/08/21 1302)  sodium chloride 0.9 % bolus 500 mL (has no administration in time range)  ondansetron (ZOFRAN) injection 4 mg (4 mg Intravenous Given 12/08/21 1301)  sodium chloride 0.9 % bolus 1,000 mL (1,000 mLs Intravenous New Bag/Given 12/08/21 1259)     IMPRESSION / MDM /  ASSESSMENT AND PLAN / ED COURSE  I reviewed the triage vital signs and the nursing notes.                              Differential diagnosis includes, but is not limited to, pancreatitis, enteritis, small bowel obstruction, diverticulitis, mass, perforation, torsion, UTI, pyelonephritis, stone, withdrawal  Presents to the ER with symptoms as described above.  She is afebrile mildly tachycardic mildly hypertensive does appear uncomfortable her IV fluids as well as IV narcotic medication.  Noted to be hypo natremia likely secondary to beer ingestion.  Based on her past medical history and presentation will order CT imaging to further evaluate.  This will be with noncontrast that she has a contrast allergy.   Clinical Course as of 12/08/21 1513  Sun Dec 08, 2021  1512 CT does show evidence of ovarian cyst given location of pain will order ultrasound to evaluate for torsion.  If this is negative do not see indication for  medical admission or hospitalization.  She does not appear to be in acute withdrawal but is asking for help with detox.  Will consult TTS. [PR]    Clinical Course User Index [PR] Willy Eddy, MD     FINAL CLINICAL IMPRESSION(S) / ED DIAGNOSES   Final diagnoses:  LLQ abdominal pain  Alcohol dependence with other alcohol-induced disorder [facility])     Rx / DC Orders   ED Discharge Orders     None        Note:  This document was prepared using Dragon voice recognition software and may include unintentional dictation errors.        Willy Eddy, MD 12/08/21 [phone]

## 2021-12-09 ENCOUNTER — Other Ambulatory Visit: Payer: Self-pay

## 2021-12-09 ENCOUNTER — Emergency Department: Payer: BC Managed Care – PPO

## 2021-12-09 ENCOUNTER — Emergency Department
Admission: EM | Admit: 2021-12-09 | Discharge: 2021-12-09 | Disposition: A | Discharge status: Home / Self Care | Payer: BC Managed Care – PPO | Attending: Emergency Medicine | Admitting: Emergency Medicine

## 2021-12-09 DIAGNOSIS — K5900 Constipation, unspecified: Secondary | ICD-10-CM | POA: Diagnosis not present

## 2021-12-09 DIAGNOSIS — N83201 Unspecified ovarian cyst, right side: Secondary | ICD-10-CM | POA: Insufficient documentation

## 2021-12-09 DIAGNOSIS — N83202 Unspecified ovarian cyst, left side: Secondary | ICD-10-CM | POA: Diagnosis not present

## 2021-12-09 DIAGNOSIS — R110 Nausea: Secondary | ICD-10-CM | POA: Insufficient documentation

## 2021-12-09 DIAGNOSIS — R1030 Lower abdominal pain, unspecified: Secondary | ICD-10-CM | POA: Diagnosis present

## 2021-12-09 DIAGNOSIS — R102 Pelvic and perineal pain: Secondary | ICD-10-CM

## 2021-12-09 LAB — CBC
HCT: 34.9 % — ABNORMAL LOW (ref 36.0–46.0)
Hemoglobin: 11.2 g/dL — ABNORMAL LOW (ref 12.0–15.0)
MCH: 27.8 pg (ref 26.0–34.0)
MCHC: 32.1 g/dL (ref 30.0–36.0)
MCV: 86.6 fL (ref 80.0–100.0)
Platelets: 345 10*3/uL (ref 150–400)
RBC: 4.03 MIL/uL (ref 3.87–5.11)
RDW: 20 % — ABNORMAL HIGH (ref 11.5–15.5)
WBC: 6.3 10*3/uL (ref 4.0–10.5)
nRBC: 0 % (ref 0.0–0.2)

## 2021-12-09 LAB — BASIC METABOLIC PANEL
Anion gap: 4 — ABNORMAL LOW (ref 5–15)
BUN: 12 mg/dL (ref 6–20)
CO2: 28 mmol/L (ref 22–32)
Calcium: 8.9 mg/dL (ref 8.9–10.3)
Chloride: 101 mmol/L (ref 98–111)
Creatinine, Ser: 0.46 mg/dL (ref 0.44–1.00)
GFR, Estimated: >60 mL/min (ref >60)
Glucose, Bld: 118 mg/dL — ABNORMAL HIGH (ref 70–99)
Potassium: 4.5 mmol/L (ref 3.5–5.1)
Sodium: 133 mmol/L — ABNORMAL LOW (ref 135–145)

## 2021-12-09 LAB — LIPASE, BLOOD: Lipase: 45 U/L (ref 11–51)

## 2021-12-09 MED ORDER — ONDANSETRON HCL 4 MG/2ML IJ SOLN
4.0000 mg | Freq: Once | INTRAMUSCULAR | Status: AC
  Administered 2021-12-09: 4 mg via INTRAVENOUS
  Filled 2021-12-09: qty 2

## 2021-12-09 MED ORDER — HYDROCODONE-ACETAMINOPHEN 5-325 MG PO TABS
1.0000 | ORAL_TABLET | Freq: Once | ORAL | Status: AC
  Administered 2021-12-09: 1 via ORAL
  Filled 2021-12-09: qty 1

## 2021-12-09 MED ORDER — MORPHINE SULFATE (PF) 4 MG/ML IV SOLN
4.0000 mg | Freq: Once | INTRAVENOUS | Status: AC
  Administered 2021-12-09: 4 mg via INTRAVENOUS
  Filled 2021-12-09: qty 1

## 2021-12-09 MED ORDER — OXYCODONE-ACETAMINOPHEN 5-325 MG PO TABS: 1.0000 | ORAL_TABLET | ORAL | 0 refills | Status: AC | PRN

## 2021-12-09 NOTE — ED Provider Notes (Addendum)
[facility] Provider Note    Event Date/Time   First MD Initiated Contact with Patient 12/09/21 1801     (approximate)   History   Abdominal Pain   HPI  Katrina Turner is a 41 y.o. female with history of alcohol dependence and substance abuse, multiple abdominal surgeries, small bowel obstruction, and previous surgery for lysis of adhesions who presents with lower abdominal pain over the last 2 days, persistent course, nonradiating, and associated with intermittent nausea but no active vomiting.  She states she is slightly constipated but has no diarrhea.  She denies any vaginal bleeding or discharge.  The patient was seen for the same complaint in the ED last night and was diagnosed with a left ovarian cyst.  She states that the pain worsened today.  She was not prescribed any medication.     Physical Exam   Triage Vital Signs: ED Triage Vitals  Enc Vitals Group     BP 12/09/21 1232 (!) 151/109     Pulse Rate 12/09/21 1230 83     Resp 12/09/21 1230 18     Temp 12/09/21 1230 98.2 F (36.8 C)     Temp Source 12/09/21 1230 Oral     SpO2 12/09/21 1230 98 %     Weight 12/09/21 1230 152 lb 1.9 oz (69 kg)     Height 12/09/21 1230 5\' 7"  (1.702 m)     Head Circumference --      Peak Flow --      Pain Score 12/09/21 1230 10     Pain Loc --      Pain Edu? --      Excl. in GC? --     Most recent vital signs: Vitals:   12/09/21 1848 12/09/21 1919  BP: (!) 154/105 (!) 142/77  Pulse: 86 79  Resp: 18 19  Temp:    SpO2: 98% 98%     General: Awake, slightly uncomfortable appearing but in no acute distress. CV:  Good peripheral perfusion.  Resp:  Normal effort.  Abd:  Soft with mild left suprapubic tenderness.  No distention. Other:  Calm and cooperative.  No tremor or asterixis.  No tongue fasciculation.  Motor intact in all extremities.   ED Results / Procedures / Treatments   Labs (all labs ordered are listed, but only abnormal results are  displayed) Labs Reviewed  CBC - Abnormal; Notable for the following components:      Result Value   Hemoglobin 11.2 (*)    HCT 34.9 (*)    RDW 20.0 (*)    All other components within normal limits  BASIC METABOLIC PANEL - Abnormal; Notable for the following components:   Sodium 133 (*)    Glucose, Bld 118 (*)    Anion gap 4 (*)    All other components within normal limits  LIPASE, BLOOD     EKG     RADIOLOGY  02/06/22 pelvis: I reviewed the images and radiology report.  Bilateral ovarian cyst, unchanged from ultrasound performed yesterday.  IMPRESSION:  1. Bilateral ovarian cysts are unchanged. Normal blood flow to  bilateral ovaries without evidence of torsion.  2. Status post hysterectomy.    PROCEDURES:  Critical Care performed: No  Procedures   MEDICATIONS ORDERED IN ED: Medications  HYDROcodone-acetaminophen (NORCO/VICODIN) 5-325 MG per tablet 1 tablet (1 tablet Oral Given 12/09/21 1242)  morphine 4 MG/ML injection 4 mg (4 mg Intravenous Given 12/09/21 1834)  ondansetron (ZOFRAN) injection 4 mg (4  mg Intravenous Given 12/09/21 1834)     IMPRESSION / MDM / ASSESSMENT AND PLAN / ED COURSE  I reviewed the triage vital signs and the nursing notes.  41 year old female with PMH as noted above presents with persistent and somewhat worsening left lower quadrant/left suprapubic pain after being diagnosed with an ovarian cyst last night.  Overall presentation is consistent with continued pain from an ovarian cyst.  I reviewed the past medical records; the patient had a CT performed yesterday which showed no acute findings.  Ultrasound yesterday showed a left ovarian cyst with no evidence of torsion or other acute complication.  Repeat ultrasound today continues to show ovarian cyst with no evidence of torsion or other acute complication.  Patient is status post hysterectomy and denies vaginal discharge or bleeding; there is no evidence of PID or other infectious cause.  Pelvic exam  is deferred since the patient has no bleeding or discharge, is status post hysterectomy, and already had pelvic imaging today.  Lab work-up was reviewed and shows no remarkable findings.  The patient was slightly hyponatremic yesterday but this has improved.  I reviewed the past records; the patient was seen at a [facility] ED in 2/22 for suicidal ideation and vomiting.  She has had no other recent ED visits or admissions for abdominal pain, bowel obstruction, or surgical complications.  Her [facility]related [facility] when the patient had RLQ abd pain with multiple lab abnormalities although ultimately had resolution of her symptoms and lab abnormalities and did not require surgery.  At this time, there continues to be no clinical evidence for ovarian torsion or indication for emergent OB/GYN consultation.  Although the CT obtained yesterday was without contrast (since the patient has a contrast allergy), it showed no bowel wall thickening or distention, or other acute abnormalities.  The patient has no abdominal distention on exam and has been tolerating p.o.  There is no clinical evidence for SBO or other surgical etiology.  Based on the patient's age, lack of risk factors and the location and quality of the pain there is no evidence of vascular etiology.  I discussed the possibility of obtaining a repeat CT including risks, benefits and alternatives, and the low likelihood of acute pathology; she agrees with not obtaining another CT at this time.    Since the patient had minimal relief from hydrocodone earlier, I will give a single dose of IV morphine and Zofran for treatment of acute pain.  ----------------------------------------- 7:27 PM on 12/09/2021 -----------------------------------------  The patient had immediate relief with IV analgesia.  She appears well and states she feels much better.  She is tolerating p.o. and has been able to eat and drink without  difficulty.  At this time, she has been effectively observed in the ED for over 8 hours with no progression of her symptoms.  Based on her well appearance, reassuring lab work-up, and the negative ultrasound, there continues to be no evidence of torsion, SBO, or other acute complication.  The patient states she wants to go home.  I counseled her on the results of the work-up.  I reviewed her record and the PDMP and she does not have any prior opiate prescriptions.  She does not have a history of opiate abuse and states that she has not drinking alcohol.  I will prescribe a small quantity of Percocet for acute pain.  I have rereferred her to OB/GYN.  I also discussed the patient's follow-up plan for  alcohol detox.  She currently does not demonstrate any tremor, tongue fasciculation, tachycardia, or other signs or symptoms of active alcohol withdrawal after 8 hours in the ED.  She still plans to go to outpatient detox.  I gave the patient thorough return precautions both for abdominal/pelvic pain and alcohol withdrawal she expressed understanding.     FINAL CLINICAL IMPRESSION(S) / ED DIAGNOSES   Final diagnoses:  Pelvic pain  Cysts of both ovaries     Rx / DC Orders   ED Discharge Orders          Ordered    oxyCODONE-acetaminophen (PERCOCET) 5-325 MG tablet  Every 4 hours PRN        12/09/21 1923             Note:  This document was prepared using Dragon voice recognition software and may include unintentional dictation errors.       Dionne Bucy, MD 12/09/21 1945

## 2021-12-09 NOTE — ED Notes (Signed)
Report received from Kelly, RN

## 2021-12-09 NOTE — ED Provider Notes (Signed)
°  Emergency Medicine Provider Triage Evaluation Note  Katrina Turner , a 41 y.o.female,  was evaluated in triage.  Pt complains of left lower quadrant pain.  Patient states that she was seen here yesterday, diagnosed with 4.1 cm ovarian cyst.  She says her pain was 6/10 yesterday, but experienced a sudden worsening last night at home.  Reports 10/10 now.   Review of Systems  Positive: Abdominal pain Negative: Denies fever, chest pain, vomiting  Physical Exam   Vitals:   12/09/21 1230 12/09/21 1232  BP:  (!) 151/109  Pulse: 83   Resp: 18   Temp: 98.2 F (36.8 C)   SpO2: 98%    Gen:   Awake, in pain Resp:  Normal effort  MSK:   Moves extremities without difficulty  Other:    Medical Decision Making  Given the patient's initial medical screening exam, the following diagnostic evaluation has been ordered. The patient will be placed in the appropriate treatment space, once one is available, to complete the evaluation and treatment. I have discussed the plan of care with the patient and I have advised the patient that an ED physician or mid-level practitioner will reevaluate their condition after the test results have been received, as the results may give them additional insight into the type of treatment they may need.    Diagnostics: Labs, pelvic ultrasound  Treatments: Hydrocodone   Varney Daily, PA 12/09/21 1237    Shaune Pollack, MD 12/09/21 [phone]

## 2021-12-09 NOTE — Discharge Instructions (Addendum)
Return to the ER for new, worsening, or persistent severe pain, pain spreading to other areas, swelling of your abdomen, nausea or vomiting, vaginal bleeding or discharge, difficulty urinating, fever, diarrhea, weakness, or any other new or worsening symptoms that concern you.  You should also return immediately if you develop worsening anxiety, tremors, shakiness, confusion, vomiting, seizure, or any other symptoms of alcohol withdrawal.  Follow-up with the OB/GYN within the next week.  Follow-up as discussed yesterday for outpatient detox.

## 2021-12-09 NOTE — ED Triage Notes (Signed)
Pt to ED with husband for LLQ pain, states was seen yesterday and dx with ovarian cyst, was told to come back if pain worsened.   Pt and husband fighting and cursing in triage area.   States last ETOH yesterday am.

## 2021-12-11 ENCOUNTER — Other Ambulatory Visit: Payer: Self-pay

## 2021-12-11 ENCOUNTER — Telehealth: Payer: Self-pay | Admitting: Pharmacy Technician

## 2021-12-11 NOTE — Telephone Encounter (Signed)
Patient called stating that she has prescription drug coverage.  Explained that patient is no longer able to receive medication assistance from [facility].  Patient verbally acknowledged that she understood.  Patient requested that prescriptions be transferred to [facility].  Made pharmacy staff aware of patient's request to transfer prescriptions.  Sherilyn Dacosta Care Manager [facility]

## 2021-12-12 ENCOUNTER — Other Ambulatory Visit: Payer: Self-pay

## 2021-12-12 ENCOUNTER — Emergency Department
Admission: EM | Admit: 2021-12-12 | Discharge: 2021-12-13 | Disposition: A | Discharge status: Home / Self Care | Payer: BC Managed Care – PPO | Attending: Emergency Medicine | Admitting: Emergency Medicine

## 2021-12-12 DIAGNOSIS — R45850 Homicidal ideations: Secondary | ICD-10-CM | POA: Insufficient documentation

## 2021-12-12 DIAGNOSIS — Z20822 Contact with and (suspected) exposure to covid-19: Secondary | ICD-10-CM | POA: Diagnosis not present

## 2021-12-12 DIAGNOSIS — F1010 Alcohol abuse, uncomplicated: Secondary | ICD-10-CM | POA: Diagnosis present

## 2021-12-12 DIAGNOSIS — F1020 Alcohol dependence, uncomplicated: Secondary | ICD-10-CM | POA: Insufficient documentation

## 2021-12-12 DIAGNOSIS — R45851 Suicidal ideations: Secondary | ICD-10-CM | POA: Diagnosis not present

## 2021-12-12 DIAGNOSIS — R112 Nausea with vomiting, unspecified: Secondary | ICD-10-CM | POA: Diagnosis present

## 2021-12-12 DIAGNOSIS — Y906 Blood alcohol level of 120-199 mg/100 ml: Secondary | ICD-10-CM | POA: Insufficient documentation

## 2021-12-12 LAB — URINE DRUG SCREEN, QUALITATIVE (ARMC ONLY)
Amphetamines, Ur Screen: NOT DETECTED
Barbiturates, Ur Screen: NOT DETECTED
Benzodiazepine, Ur Scrn: NOT DETECTED
Cannabinoid 50 Ng, Ur ~~LOC~~: NOT DETECTED
Cocaine Metabolite,Ur ~~LOC~~: NOT DETECTED
MDMA (Ecstasy)Ur Screen: NOT DETECTED
Methadone Scn, Ur: NOT DETECTED
Opiate, Ur Screen: NOT DETECTED
Phencyclidine (PCP) Ur S: NOT DETECTED
Tricyclic, Ur Screen: NOT DETECTED

## 2021-12-12 LAB — COMPREHENSIVE METABOLIC PANEL
ALT: 13 U/L (ref 0–44)
AST: 18 U/L (ref 15–41)
Albumin: 3.4 g/dL — ABNORMAL LOW (ref 3.5–5.0)
Alkaline Phosphatase: 87 U/L (ref 38–126)
Anion gap: 7 (ref 5–15)
BUN: 13 mg/dL (ref 6–20)
CO2: 27 mmol/L (ref 22–32)
Calcium: 8.7 mg/dL — ABNORMAL LOW (ref 8.9–10.3)
Chloride: 102 mmol/L (ref 98–111)
Creatinine, Ser: 0.54 mg/dL (ref 0.44–1.00)
GFR, Estimated: >60 mL/min (ref >60)
Glucose, Bld: 102 mg/dL — ABNORMAL HIGH (ref 70–99)
Potassium: 4.7 mmol/L (ref 3.5–5.1)
Sodium: 136 mmol/L (ref 135–145)
Total Bilirubin: 0.4 mg/dL (ref 0.3–1.2)
Total Protein: 6.8 g/dL (ref 6.5–8.1)

## 2021-12-12 LAB — CBC
HCT: 34.4 % — ABNORMAL LOW (ref 36.0–46.0)
Hemoglobin: 10.9 g/dL — ABNORMAL LOW (ref 12.0–15.0)
MCH: 27 pg (ref 26.0–34.0)
MCHC: 31.7 g/dL (ref 30.0–36.0)
MCV: 85.1 fL (ref 80.0–100.0)
Platelets: 312 10*3/uL (ref 150–400)
RBC: 4.04 MIL/uL (ref 3.87–5.11)
RDW: 20.2 % — ABNORMAL HIGH (ref 11.5–15.5)
WBC: 5 10*3/uL (ref 4.0–10.5)
nRBC: 0 % (ref 0.0–0.2)

## 2021-12-12 LAB — SALICYLATE LEVEL: Salicylate Lvl: <7 mg/dL — ABNORMAL LOW (ref 7.0–30.0)

## 2021-12-12 LAB — ACETAMINOPHEN LEVEL: Acetaminophen (Tylenol), Serum: <10 ug/mL — ABNORMAL LOW (ref 10–30)

## 2021-12-12 LAB — RESP PANEL BY RT-PCR (FLU A&B, COVID) ARPGX2
Influenza A by PCR: NEGATIVE
Influenza B by PCR: NEGATIVE
SARS Coronavirus 2 by RT PCR: NEGATIVE

## 2021-12-12 LAB — POC URINE PREG, ED: Preg Test, Ur: NEGATIVE

## 2021-12-12 LAB — ETHANOL: Alcohol, Ethyl (B): 124 mg/dL — ABNORMAL HIGH (ref <10)

## 2021-12-12 MED ORDER — TRAZODONE HCL 50 MG PO TABS
25.0000 mg | ORAL_TABLET | Freq: Every day | ORAL | Status: DC
  Administered 2021-12-12: 25 mg via ORAL
  Filled 2021-12-12 (×2): qty 0.5

## 2021-12-12 MED ORDER — THIAMINE HCL 100 MG/ML IJ SOLN
100.0000 mg | Freq: Every day | INTRAMUSCULAR | Status: DC
  Filled 2021-12-12: qty 1

## 2021-12-12 MED ORDER — LORAZEPAM 2 MG/ML IJ SOLN: 0.0000 mg | Freq: Four times a day (QID) | INTRAMUSCULAR | Status: DC

## 2021-12-12 MED ORDER — GABAPENTIN 100 MG PO CAPS
200.0000 mg | ORAL_CAPSULE | Freq: Two times a day (BID) | ORAL | Status: DC
  Administered 2021-12-12 – 2021-12-13 (×3): 200 mg via ORAL
  Filled 2021-12-12 (×3): qty 2

## 2021-12-12 MED ORDER — THIAMINE HCL 100 MG PO TABS
100.0000 mg | ORAL_TABLET | Freq: Every day | ORAL | Status: DC
  Administered 2021-12-12 – 2021-12-13 (×2): 100 mg via ORAL
  Filled 2021-12-12 (×2): qty 1

## 2021-12-12 MED ORDER — LORAZEPAM 2 MG/ML IJ SOLN: 0.0000 mg | Freq: Two times a day (BID) | INTRAMUSCULAR | Status: DC

## 2021-12-12 MED ORDER — ALBUTEROL SULFATE HFA 108 (90 BASE) MCG/ACT IN AERS
1.0000 | INHALATION_SPRAY | Freq: Four times a day (QID) | RESPIRATORY_TRACT | Status: DC | PRN
  Filled 2021-12-12: qty 6.7

## 2021-12-12 MED ORDER — PANTOPRAZOLE SODIUM 40 MG PO TBEC
40.0000 mg | DELAYED_RELEASE_TABLET | Freq: Every day | ORAL | Status: DC
  Administered 2021-12-12 – 2021-12-13 (×2): 40 mg via ORAL
  Filled 2021-12-12 (×2): qty 1

## 2021-12-12 MED ORDER — LORAZEPAM 2 MG PO TABS
0.0000 mg | ORAL_TABLET | Freq: Four times a day (QID) | ORAL | Status: DC
  Administered 2021-12-12 – 2021-12-13 (×4): 2 mg via ORAL
  Filled 2021-12-12 (×4): qty 1

## 2021-12-12 MED ORDER — LORAZEPAM 2 MG PO TABS: 0.0000 mg | ORAL_TABLET | Freq: Two times a day (BID) | ORAL | Status: DC

## 2021-12-12 NOTE — BH Assessment (Signed)
Referral information for Psychiatric Hospitalization faxed to;   [facility] [phone]),   [facility] [phone])  [facility] [phone]),  [facility] [phone])  [facility] [phone] or [phone])  [facility] [phone]),   [facility] [phone]- [phone]),   [facility] [phone])  [facility] [phone]).  [facility] [phone])  [facility] [phone]

## 2021-12-12 NOTE — ED Provider Notes (Signed)
[facility] Provider Note    Event Date/Time   First MD Initiated Contact with Patient 12/12/21 1025     (approximate)   History   Homicidal   HPI  Katrina Turner is a 41 y.o. female with a past medical history of multiple prior abdominal surgeries associated with small bowel obstruction and previously requiring lysis of adhesions most recently seen for abdominal pain on 1/2 as well as history of alcohol dependence and subs abuse who presents for assessment requesting assistance with her alcohol abuse stating she feels like she was cut her husband last night.  She states she drinks 2 cases of liquor per day.  He states he sometimes feels suicidal on and off but this been going on for several months and is not particular new today.  She denies any suicidal plan.  She states that she thought she was getting hit her husband over the head with something last night.  She also endorses subacute to chronic hallucinations but does not describe it at this time.  She states she has some chronic nausea and vomiting but it is not any different today and denies any new abdominal pain, back pain, chest pain, fevers, cough, diarrhea, urinary symptoms or any other acute complaints.  ]      Physical Exam  Triage Vital Signs: ED Triage Vitals  Enc Vitals Group     BP 12/12/21 0907 131/80     Pulse Rate 12/12/21 0907 81     Resp 12/12/21 0907 18     Temp 12/12/21 0907 98.7 F (37.1 C)     Temp Source 12/12/21 0907 Oral     SpO2 12/12/21 0907 98 %     Weight 12/12/21 0906 152 lb 1.9 oz (69 kg)     Height 12/12/21 0906 5\' 7"  (1.702 m)     Head Circumference --      Peak Flow --      Pain Score 12/12/21 0905 0     Pain Loc --      Pain Edu? --      Excl. in GC? --     Most recent vital signs: Vitals:   12/12/21 0907  BP: 131/80  Pulse: 81  Resp: 18  Temp: 98.7 F (37.1 C)  SpO2: 98%    General: Awake, no distress.  CV:  Good peripheral perfusion.   Resp:  Normal effort.  Abd:  No distention.  Other:  Appears mildly intoxicated.  Does not seem psychotic or actively suicidal.  Patient states she often feels like she wants to kill her husband.   ED Results / Procedures / Treatments  Labs (all labs ordered are listed, but only abnormal results are displayed) Labs Reviewed  COMPREHENSIVE METABOLIC PANEL - Abnormal; Notable for the following components:      Result Value   Glucose, Bld 102 (*)    Calcium 8.7 (*)    Albumin 3.4 (*)    All other components within normal limits  ETHANOL - Abnormal; Notable for the following components:   Alcohol, Ethyl (B) 124 (*)    All other components within normal limits  SALICYLATE LEVEL - Abnormal; Notable for the following components:   Salicylate Lvl <7.0 (*)    All other components within normal limits  ACETAMINOPHEN LEVEL - Abnormal; Notable for the following components:   Acetaminophen (Tylenol), Serum <10 (*)    All other components within normal limits  CBC - Abnormal; Notable for the following components:  Hemoglobin 10.9 (*)    HCT 34.4 (*)    RDW 20.2 (*)    All other components within normal limits  RESP PANEL BY RT-PCR (FLU A&B, COVID) ARPGX2  URINE DRUG SCREEN, QUALITATIVE ([facility] ONLY)  POC URINE PREG, ED     EKG     RADIOLOGY    PROCEDURES:  Critical Care performed: No  Procedures    MEDICATIONS ORDERED IN ED: Medications  albuterol (VENTOLIN HFA) 108 (90 Base) MCG/ACT inhaler 1-2 puff (has no administration in time range)  gabapentin (NEURONTIN) capsule 200 mg (has no administration in time range)  pantoprazole (PROTONIX) EC tablet 40 mg (has no administration in time range)  traZODone (DESYREL) tablet 25 mg (has no administration in time range)  LORazepam (ATIVAN) injection 0-4 mg (has no administration in time range)    Or  LORazepam (ATIVAN) tablet 0-4 mg (has no administration in time range)  LORazepam (ATIVAN) injection 0-4 mg (has no  administration in time range)    Or  LORazepam (ATIVAN) tablet 0-4 mg (has no administration in time range)  thiamine tablet 100 mg (has no administration in time range)    Or  thiamine (B-1) injection 100 mg (has no administration in time range)     IMPRESSION / MDM / ASSESSMENT AND PLAN / ED COURSE  I reviewed the triage vital signs and the nursing notes.                              Patient presents with above-stated history exam requesting assistance with alcohol abuse and for help with her [place] thoughts towards her husband and chronic suicidal ideations and hallucinations.  On arrival she is afebrile hemodynamically stable.  She does appear mildly intoxicated.  She states she thinks he may have a history of bipolar disorder although has not been on medications for several months.  Differential includes symptoms related to acute alcohol intoxication ongoing abuse, decompensated psychiatric illness versus other substance induced symptoms.  Lower suspicion for time based on history exam for preceding acute physical traumatic event or other significant metabolic or endocrine derangement.  CMP shows no significant electrolyte or metabolic derangements.  Ethanol elevated at 124.  Also late and acetaminophen levels undetectable.  CBC is unremarkable aside from some chronic appearing anemia with hemoglobin of 10.9 compared to 11.23 days ago.  UDS negative.  Pregnancy test was negative.  Psychiatry and TTS consulted.  I will place patient on CIWA protocol pending psychiatry evaluation.  The patient has been placed in psychiatric observation due to the need to provide a safe environment for the patient while obtaining psychiatric consultation and evaluation, as well as ongoing medical and medication management to treat the patient's condition.  The patient has not been placed under full IVC at this time.      FINAL CLINICAL IMPRESSION(S) / ED DIAGNOSES   Final diagnoses:  Alcohol abuse   Homicidal ideation     Rx / DC Orders   ED Discharge Orders     None        Note:  This document was prepared using Dragon voice recognition software and may include unintentional dictation errors.   Gilles Chiquito, MD 12/12/21 1032

## 2021-12-12 NOTE — ED Notes (Signed)
VOL/Consult completed/Pending Placement 

## 2021-12-12 NOTE — Consult Note (Signed)
[facility] Face-to-Face Psychiatry Consult   Reason for Consult:  alcohol intoxication Referring Physician:  EDP Patient Identification: Katrina Turner MRN:  [id number] Principal Diagnosis: Alcohol abuse Diagnosis:  Principal Problem:   Alcohol abuse   Total Time spent with patient: 45 minutes  Subjective: "I have a problem with alcohol.'  Katrina Turner is a 41 y.o. female patient admitted with alcohol intoxication and "threatening to kill husband".  HPI:  Chart reviewed. Patient was seen several hours after initial admission to ED. She had been given ativan according to CIWA protocol and she was sleepy during assessment. Patient states that she wants alcohol rehab. She denies thoughts of suicide. She admits to saying that she "wanted to kill her husband' earlier, but does not want to now. She does say that she has auditory hallucinations sometimes, but not currently. Patient is requesting alcohol dependence treatment.   Past Psychiatric History: alcohol use disorder  Risk to Self:   Risk to Others:   Prior Inpatient Therapy:   Prior Outpatient Therapy:    Past Medical History:  Past Medical History:  Diagnosis Date   Alcohol abuse    Allergy    Bipolar 1 disorder ([place])    Depression    GERD (gastroesophageal reflux disease)    Neuromuscular disorder ([place])    Pancreatitis 11/28/2019   approximated    Paranoid schizophrenia ([place])    PTSD (post-traumatic stress disorder)    Reynolds syndrome [facility])     Past Surgical History:  Procedure Laterality Date   APPENDECTOMY     CESAREAN SECTION     ESOPHAGOGASTRODUODENOSCOPY N/A 10/17/2020   Procedure: ESOPHAGOGASTRODUODENOSCOPY (EGD);  Surgeon: Toledo, Benay Pike, MD;  Location: [facility] ENDOSCOPY;  Service: Gastroenterology;  Laterality: N/A;   HERNIA REPAIR     PARTIAL HYSTERECTOMY     ovaries present not uterus- precancerous cells of uterus    ROUX-EN-Y GASTRIC BYPASS     Family History:  Family History  Problem Relation Age of Onset    Heart attack Mother    Diabetes Father    Heart disease Father    Schizophrenia Brother    Autism Son    Pancreatic cancer Maternal Grandmother    Heart disease Maternal Grandfather    Seizures Paternal Grandmother    Heart disease Paternal Grandfather    Prostate cancer Paternal Grandfather    Family Psychiatric  History: unknown Social History:  Social History   Substance and Sexual Activity  Alcohol Use Yes   Alcohol/week: 36.0 standard drinks   Types: 36 Cans of beer per week   Comment: pt states a qt. a day     Social History   Substance and Sexual Activity  Drug Use Not Currently    Social History   Socioeconomic History   Marital status: Married    Spouse name: Juanda Crumble   Number of children: 1   Years of education: Not on file   Highest education level: Not on file  Occupational History   Not on file  Tobacco Use   Smoking status: Every Day    Packs/day: 0.50    Years: 7.00    Pack years: 3.50    Types: Cigarettes   Smokeless tobacco: Never  Vaping Use   Vaping Use: Never used  Substance and Sexual Activity   Alcohol use: Yes    Alcohol/week: 36.0 standard drinks    Types: 36 Cans of beer per week    Comment: pt states a qt. a day   Drug use: Not Currently  Sexual activity: Yes  Other Topics Concern   Not on file  Social History Narrative   Not on file   Social Determinants of Health   Financial Resource Strain: Not on file  Food Insecurity: No Food Insecurity   Worried About Running Out of Food in the Last Year: Never true   Ran Out of Food in the Last Year: Never true  Transportation Needs: No Transportation Needs   Lack of Transportation (Medical): No   Lack of Transportation (Non-Medical): No  Physical Activity: Not on file  Stress: Not on file  Social Connections: Not on file   Additional Social History:    Allergies:   Allergies  Allergen Reactions   Depakote Er [Divalproex Sodium Er]     Alopecia and hive   Other Swelling     Throat swelling per pt   Bee Pollen Other (See Comments)   Bee Venom    Carbamazepine Hives   Iodinated Contrast Media Hives and Other (See Comments)    Tongue swells.    Nsaids     Labs:  Results for orders placed or performed during the hospital encounter of 12/12/21 (from the past 48 hour(s))  Comprehensive metabolic panel     Status: Abnormal   Collection Time: 12/12/21  9:07 AM  Result Value Ref Range   Sodium 136 135 - 145 mmol/L   Potassium 4.7 3.5 - 5.1 mmol/L   Chloride 102 98 - 111 mmol/L   CO2 27 22 - 32 mmol/L   Glucose, Bld 102 (H) 70 - 99 mg/dL    Comment: Glucose reference range applies only to samples taken after fasting for at least 8 hours.   BUN 13 6 - 20 mg/dL   Creatinine, Ser 0.54 0.44 - 1.00 mg/dL   Calcium 8.7 (L) 8.9 - 10.3 mg/dL   Total Protein 6.8 6.5 - 8.1 g/dL   Albumin 3.4 (L) 3.5 - 5.0 g/dL   AST 18 15 - 41 U/L   ALT 13 0 - 44 U/L   Alkaline Phosphatase 87 38 - 126 U/L   Total Bilirubin 0.4 0.3 - 1.2 mg/dL   GFR, Estimated >60 >60 mL/min    Comment: (NOTE) Calculated using the CKD-EPI Creatinine Equation (2021)    Anion gap 7 5 - 15    Comment: Performed at [facility], [address], [place], [place] [address]  Ethanol     Status: Abnormal   Collection Time: 12/12/21  9:07 AM  Result Value Ref Range   Alcohol, Ethyl (B) 124 (H) <10 mg/dL    Comment: (NOTE) Lowest detectable limit for serum alcohol is 10 mg/dL.  For medical purposes only. Performed at [facility], [address] [place], [place] [id number]   Salicylate level     Status: Abnormal   Collection Time: 12/12/21  9:07 AM  Result Value Ref Range   Salicylate Lvl Q000111Q (L) 7.0 - 30.0 mg/dL    Comment: Performed at [facility], [address] [place], [place] [address]  Acetaminophen level     Status: Abnormal   Collection Time: 12/12/21  9:07 AM  Result Value Ref Range   Acetaminophen (Tylenol), Serum <10 (L) 10 - 30 ug/mL    Comment:  (NOTE) Therapeutic concentrations vary significantly. A range of 10-30 ug/mL  may be an effective concentration for many patients. However, some  are best treated at concentrations outside of this range. Acetaminophen concentrations >150 ug/mL at 4 hours after ingestion  and >50  ug/mL at 12 hours after ingestion are often associated with  toxic reactions.  Performed at [facility], [place], [address] [place], [place] [address]   cbc     Status: Abnormal   Collection Time: 12/12/21  9:07 AM  Result Value Ref Range   WBC 5.0 4.0 - 10.5 K/uL   RBC 4.04 3.87 - 5.11 MIL/uL   Hemoglobin 10.9 (L) 12.0 - 15.0 g/dL   HCT 34.4 (L) 36.0 - 46.0 %   MCV 85.1 80.0 - 100.0 fL   MCH 27.0 26.0 - 34.0 pg   MCHC 31.7 30.0 - 36.0 g/dL   RDW 20.2 (H) 11.5 - 15.5 %   Platelets 312 150 - 400 K/uL   nRBC 0.0 0.0 - 0.2 %    Comment: Performed at [facility], [address] [place], [place] [address]  Urine Drug Screen, Qualitative     Status: None   Collection Time: 12/12/21  9:07 AM  Result Value Ref Range   Tricyclic, Ur Screen NONE DETECTED NONE DETECTED   Amphetamines, Ur Screen NONE DETECTED NONE DETECTED   MDMA (Ecstasy)Ur Screen NONE DETECTED NONE DETECTED   Cocaine Metabolite,Ur [place] NONE DETECTED NONE DETECTED   Opiate, Ur Screen NONE DETECTED NONE DETECTED   Phencyclidine (PCP) Ur S NONE DETECTED NONE DETECTED   Cannabinoid 50 Ng, Ur [place] NONE DETECTED NONE DETECTED   Barbiturates, Ur Screen NONE DETECTED NONE DETECTED   Benzodiazepine, Ur Scrn NONE DETECTED NONE DETECTED   Methadone Scn, Ur NONE DETECTED NONE DETECTED    Comment: (NOTE) Tricyclics + metabolites, urine    Cutoff 1000 ng/mL Amphetamines + metabolites, urine  Cutoff 1000 ng/mL MDMA (Ecstasy), urine              Cutoff 500 ng/mL Cocaine Metabolite, urine          Cutoff 300 ng/mL Opiate + metabolites, urine        Cutoff 300 ng/mL Phencyclidine (PCP), urine         Cutoff 25 ng/mL Cannabinoid, urine                  Cutoff 50 ng/mL Barbiturates + metabolites, urine  Cutoff 200 ng/mL Benzodiazepine, urine              Cutoff 200 ng/mL Methadone, urine                   Cutoff 300 ng/mL  The urine drug screen provides only a preliminary, unconfirmed analytical test result and should not be used for non-medical purposes. Clinical consideration and professional judgment should be applied to any positive drug screen result due to possible interfering substances. A more specific alternate chemical method must be used in order to obtain a confirmed analytical result. Gas chromatography / mass spectrometry (GC/MS) is the preferred confirm atory method. Performed at [facility], [address] [place], [place] [address]   POC urine preg, ED     Status: None   Collection Time: 12/12/21  9:20 AM  Result Value Ref Range   Preg Test, Ur NEGATIVE NEGATIVE    Comment:        THE SENSITIVITY OF THIS METHODOLOGY IS >24 mIU/mL   Resp Panel by RT-PCR (Flu A&B, Covid) Nasopharyngeal Swab     Status: None   Collection Time: 12/12/21 10:32 AM   Specimen: Nasopharyngeal Swab; Nasopharyngeal(NP) swabs in vial transport medium  Result Value Ref Range   SARS Coronavirus 2 by RT PCR  NEGATIVE NEGATIVE    Comment: (NOTE) SARS-CoV-2 target nucleic acids are NOT DETECTED.  The SARS-CoV-2 RNA is generally detectable in upper respiratory specimens during the acute phase of infection. The lowest concentration of SARS-CoV-2 viral copies this assay can detect is 138 copies/mL. A negative result does not preclude SARS-Cov-2 infection and should not be used as the sole basis for treatment or other patient management decisions. A negative result may occur with  improper specimen collection/handling, submission of specimen other than nasopharyngeal swab, presence of viral mutation(s) within the areas targeted by this assay, and inadequate number of viral copies(<138 copies/mL). A negative result must be  combined with clinical observations, patient history, and epidemiological information. The expected result is Negative.  Fact Sheet for Patients:  EntrepreneurPulse.com.au  Fact Sheet for Healthcare Providers:  IncredibleEmployment.be  This test is no t yet approved or cleared by the [place] FDA and  has been authorized for detection and/or diagnosis of SARS-CoV-2 by FDA under an Emergency Use Authorization (EUA). This EUA will remain  in effect (meaning this test can be used) for the duration of the COVID-19 declaration under Section 564(b)(1) of the Act, 21 U.S.C.section 360bbb-3(b)(1), unless the authorization is terminated  or revoked sooner.       Influenza A by PCR NEGATIVE NEGATIVE   Influenza B by PCR NEGATIVE NEGATIVE    Comment: (NOTE) The Xpert Xpress SARS-CoV-2/FLU/RSV plus assay is intended as an aid in the diagnosis of influenza from Nasopharyngeal swab specimens and should not be used as a sole basis for treatment. Nasal washings and aspirates are unacceptable for Xpert Xpress SARS-CoV-2/FLU/RSV testing.  Fact Sheet for Patients: EntrepreneurPulse.com.au  Fact Sheet for Healthcare Providers: IncredibleEmployment.be  This test is not yet approved or cleared by the [place] FDA and has been authorized for detection and/or diagnosis of SARS-CoV-2 by FDA under an Emergency Use Authorization (EUA). This EUA will remain in effect (meaning this test can be used) for the duration of the COVID-19 declaration under Section 564(b)(1) of the Act, 21 U.S.C. section 360bbb-3(b)(1), unless the authorization is terminated or revoked.  Performed at [facility], [address] [place], [place] [address]     Current Facility-Administered Medications  Medication Dose Route Frequency Provider Last Rate Last Admin   albuterol (VENTOLIN HFA) 108 (90 Base) MCG/ACT inhaler 1-2 puff   1-2 puff Inhalation Q6H PRN Lucrezia Starch, MD       gabapentin (NEURONTIN) capsule 200 mg  200 mg Oral BID Lucrezia Starch, MD   200 mg at 12/12/21 1103   LORazepam (ATIVAN) injection 0-4 mg  0-4 mg Intravenous Q6H Lucrezia Starch, MD       Or   LORazepam (ATIVAN) tablet 0-4 mg  0-4 mg Oral Q6H Lucrezia Starch, MD   2 mg at 12/12/21 1103   [START ON 12/14/2021] LORazepam (ATIVAN) injection 0-4 mg  0-4 mg Intravenous Q12H Lucrezia Starch, MD       Or   Derrill Memo ON 12/14/2021] LORazepam (ATIVAN) tablet 0-4 mg  0-4 mg Oral Q12H Lucrezia Starch, MD       pantoprazole (PROTONIX) EC tablet 40 mg  40 mg Oral Daily Lucrezia Starch, MD   40 mg at 12/12/21 1102   thiamine tablet 100 mg  100 mg Oral Daily Lucrezia Starch, MD   100 mg at 12/12/21 1102   Or   thiamine (B-1) injection 100 mg  100 mg Intravenous Daily Lucrezia Starch, MD  traZODone (DESYREL) tablet 25 mg  25 mg Oral QHS Lucrezia Starch, MD       Current Outpatient Medications  Medication Sig Dispense Refill   albuterol (VENTOLIN HFA) 108 (90 Base) MCG/ACT inhaler Inhale 1-2 puffs into the lungs every 6 (six) hours as needed for wheezing or shortness of breath (excercise induced asthma). 6.7 g 2   cetirizine (ZYRTEC ALLERGY) 10 MG tablet Take 1 tablet (10 mg total) by mouth once daily. 30 tablet 2   DULoxetine (CYMBALTA) 20 MG capsule Take 1 capsule (20 mg total) by mouth daily. 30 capsule 0   gabapentin (NEURONTIN) 100 MG capsule Take 2 capsules (200 mg total) by mouth 2 (two) times daily. 120 capsule 0   oxyCODONE-acetaminophen (PERCOCET) 5-325 MG tablet Take 1 tablet by mouth every 4 (four) hours as needed for up to 5 days for severe pain. 10 tablet 0   pantoprazole (PROTONIX) 40 MG tablet Take 1 tablet (40 mg total) by mouth once daily. 30 tablet 0   SUMAtriptan (IMITREX) 50 MG tablet Take 1 tablet (50 mg total) by mouth once daily for 1 dose. May repeat in 2 hours if headache persists or recurs. (Max of 2 doses in 24 hours). 10  tablet 0   traZODone (DESYREL) 50 MG tablet Take (1/2) tablet (25 mg total) by mouth once daily at bedtime. 15 tablet 0    Musculoskeletal: Strength & Muscle Tone: within normal limits Gait & Station:  not assessed Patient leans: N/A            Psychiatric Specialty Exam:  Presentation  General Appearance: Disheveled  Eye Contact:Fleeting  Speech:Slurred  Speech Volume:Normal  Handedness:Right   Mood and Affect  Mood:Dysphoric  Affect:Blunt   Thought Process  Thought Processes:Goal Directed  Descriptions of Associations:Intact  Orientation:Full (Time, Place and Person)  Thought Content:WDL  History of Schizophrenia/Schizoaffective disorder:No  Duration of Psychotic Symptoms:No data recorded Hallucinations:Hallucinations: None  Ideas of Reference:None  Suicidal Thoughts:Suicidal Thoughts: No  Homicidal Thoughts:Homicidal Thoughts: No   Sensorium  Memory:Immediate Good  Judgment:Poor  Insight:Poor   Executive Functions  Concentration:Fair  Attention Span:Fair  [place]   Psychomotor Activity  Psychomotor Activity:Psychomotor Activity: Decreased   Assets  Assets:Desire for Improvement; Resilience   Sleep  Sleep:Sleep: Poor   Physical Exam: Physical Exam Vitals and nursing note reviewed.  Constitutional:      Comments: Sleepy, slurred speech  HENT:     Head: Normocephalic.     Nose: No congestion or rhinorrhea.  Eyes:     General:        Right eye: No discharge.        Left eye: No discharge.  Cardiovascular:     Rate and Rhythm: Normal rate.  Pulmonary:     Effort: Pulmonary effort is normal.  Musculoskeletal:        General: Normal range of motion.     Cervical back: Normal range of motion.  Neurological:     Mental Status: She is oriented to person, place, and time.  Psychiatric:        Mood and Affect: Affect is blunt.        Behavior: Behavior is cooperative.         Thought Content: Thought content normal.        Judgment: Judgment is impulsive.   Review of Systems  Psychiatric/Behavioral:  Positive for depression and substance abuse.   All other systems reviewed and are negative. Blood pressure 129/87,  pulse 92, temperature 98 F (36.7 C), temperature source Oral, resp. rate 16, height 5\' 7"  (1.702 m), weight 69 kg, SpO2 98 %. Body mass index is 23.82 kg/m.  Treatment Plan Summary: Plan Refer to alcohol dependence program . Reviewed with EDP. CIWA protocol has been in place.  Disposition: Refer to alcohol dependence program. TTS is referring out.   Sherlon Handing, NP 12/12/2021 5:24 PM

## 2021-12-12 NOTE — ED Notes (Signed)
Pt dressed out by Kara Mead, NT.   Pt belongings: 1 green jacket 1 purple sweater 1 black dress 2 white socks 2 black shoes 1 brown hat 1 pink underwear

## 2021-12-12 NOTE — ED Notes (Signed)
Pt. Transferred to BHU from ED to room 5 after screening for contraband. Report to include Situation, Background, Assessment and Recommendations from Kim RN. Pt. Oriented to unit including Q15 minute rounds as well as the security cameras for their protection. Patient is alert and oriented, warm and dry in no acute distress. Patient denies SI, HI, and AVH. Pt. Encouraged to let me know if needs arise. 

## 2021-12-12 NOTE — ED Triage Notes (Signed)
Pt to ED for thoughts of killing husband, pt states "I just want to beat the shit out of him because he wont shut his mouth" Last ETOH use 45 mins ago Denies drug use Pt slurring words.

## 2021-12-13 ENCOUNTER — Other Ambulatory Visit: Payer: Medicaid Other

## 2021-12-13 MED ORDER — ACETAMINOPHEN 325 MG PO TABS
650.0000 mg | ORAL_TABLET | Freq: Four times a day (QID) | ORAL | Status: DC | PRN
  Administered 2021-12-13: 650 mg via ORAL
  Filled 2021-12-13: qty 2

## 2021-12-13 MED ORDER — DULOXETINE HCL 20 MG PO CPEP
20.0000 mg | ORAL_CAPSULE | Freq: Every day | ORAL | Status: DC
  Administered 2021-12-13: 20 mg via ORAL
  Filled 2021-12-13 (×2): qty 1

## 2021-12-13 MED ORDER — NICOTINE 21 MG/24HR TD PT24
21.0000 mg | MEDICATED_PATCH | Freq: Every day | TRANSDERMAL | Status: DC
  Administered 2021-12-13: 21 mg via TRANSDERMAL
  Filled 2021-12-13 (×2): qty 1

## 2021-12-13 NOTE — ED Notes (Signed)
VS not taken, patient asleep 

## 2021-12-13 NOTE — ED Notes (Signed)
Pt given ice cream

## 2021-12-13 NOTE — Consult Note (Signed)
Client continues to deny suicidal/homicidal ideations, hallucinations.  She does complain of a headache and tremors, PRN medications in place, nurse notified.  Psychiatrically cleared for alcohol detox/rehab.  Nanine Means, PMHNP

## 2021-12-13 NOTE — ED Notes (Signed)
VOL/Pending Placement 

## 2021-12-13 NOTE — ED Provider Notes (Signed)
Procedures     ----------------------------------------- 7:34 PM on 12/13/2021 ----------------------------------------- Patient remains medically stable.  She has been cleared by psychiatry.  They have attempted to arrange detox placement for her, but have been unsuccessful.  She is suitable for discharge with outpatient follow-up information with substance abuse treatment programs.     Sharman Cheek, MD 12/13/21 [phone]

## 2021-12-13 NOTE — BH Assessment (Signed)
Patient referred out for detox treatment but insurance was unable to be verified.

## 2021-12-13 NOTE — BH Assessment (Addendum)
Referral Checks;    [facility] [phone]),    [facility] [phone])   [facility] [phone]),   [facility] [phone])   [facility] [phone] or [phone])   [facility] [phone]),    [facility] [phone] -or- [phone]),    [facility] [phone]) Denied due to patient wanting Alcohol treatment   [facility] [phone]).   [facility] [phone])   [facility] [phone]  [facility] [phone]) reports having beds available but to contact back after 8am

## 2021-12-13 NOTE — ED Notes (Signed)
Husband Katrina Turner returned phone call. He was informed patient was being discharged and would need to picked up. [name] states he is on the way. Patient dressed out and took to lobby to wait for husband.

## 2021-12-13 NOTE — ED Notes (Signed)
Dinner tray and sprite given to pt.

## 2021-12-13 NOTE — BH Assessment (Signed)
TTS unable to secure detox facility due to patient's insurance issues. This Clinical research associate provided patient with [facility] Out[facility] referral for Outpatient Substance abuse treatment

## 2021-12-17 ENCOUNTER — Other Ambulatory Visit: Payer: Self-pay

## 2021-12-18 ENCOUNTER — Telehealth: Payer: Self-pay

## 2021-12-18 NOTE — Telephone Encounter (Signed)
called to make in person visit for patient no answer left voicemail for a call  back

## 2021-12-19 ENCOUNTER — Other Ambulatory Visit: Payer: Self-pay

## 2021-12-19 ENCOUNTER — Telehealth: Payer: Self-pay

## 2021-12-19 NOTE — Telephone Encounter (Signed)
Called no answer letter sent

## 2022-01-15 ENCOUNTER — Other Ambulatory Visit: Payer: Self-pay

## 2022-02-10 ENCOUNTER — Ambulatory Visit: Payer: Medicaid Other | Admitting: Adult Health

## 2022-02-14 ENCOUNTER — Encounter: Payer: Self-pay | Admitting: Physician Assistant

## 2022-02-14 ENCOUNTER — Ambulatory Visit: Payer: Medicaid Other | Admitting: Physician Assistant

## 2022-02-14 ENCOUNTER — Ambulatory Visit: Payer: BC Managed Care – PPO | Admitting: Physician Assistant

## 2022-02-14 ENCOUNTER — Other Ambulatory Visit: Payer: Self-pay

## 2022-02-14 DIAGNOSIS — G43709 Chronic migraine without aura, not intractable, without status migrainosus: Secondary | ICD-10-CM

## 2022-02-14 DIAGNOSIS — Z8719 Personal history of other diseases of the digestive system: Secondary | ICD-10-CM

## 2022-02-14 DIAGNOSIS — R200 Anesthesia of skin: Secondary | ICD-10-CM

## 2022-02-14 DIAGNOSIS — G4700 Insomnia, unspecified: Secondary | ICD-10-CM

## 2022-02-14 DIAGNOSIS — M79604 Pain in right leg: Secondary | ICD-10-CM

## 2022-02-14 DIAGNOSIS — F1010 Alcohol abuse, uncomplicated: Secondary | ICD-10-CM

## 2022-02-14 DIAGNOSIS — I10 Essential (primary) hypertension: Secondary | ICD-10-CM | POA: Diagnosis not present

## 2022-02-14 DIAGNOSIS — R202 Paresthesia of skin: Secondary | ICD-10-CM

## 2022-02-14 DIAGNOSIS — F1994 Other psychoactive substance use, unspecified with psychoactive substance-induced mood disorder: Secondary | ICD-10-CM

## 2022-02-14 DIAGNOSIS — Z8709 Personal history of other diseases of the respiratory system: Secondary | ICD-10-CM | POA: Diagnosis not present

## 2022-02-14 DIAGNOSIS — M545 Low back pain, unspecified: Secondary | ICD-10-CM

## 2022-02-14 DIAGNOSIS — G629 Polyneuropathy, unspecified: Secondary | ICD-10-CM

## 2022-02-14 DIAGNOSIS — F319 Bipolar disorder, unspecified: Secondary | ICD-10-CM

## 2022-02-14 DIAGNOSIS — M79605 Pain in left leg: Secondary | ICD-10-CM

## 2022-02-14 MED ORDER — HYDROXYZINE PAMOATE 50 MG PO CAPS: 50.0000 mg | ORAL_CAPSULE | Freq: Three times a day (TID) | ORAL | 0 refills | Status: AC

## 2022-02-14 MED ORDER — QUETIAPINE FUMARATE 100 MG PO TABS: 100.0000 mg | ORAL_TABLET | Freq: Every day | ORAL | 0 refills | Status: DC

## 2022-02-14 MED ORDER — VENLAFAXINE HCL 37.5 MG PO TABS: 37.5000 mg | ORAL_TABLET | Freq: Two times a day (BID) | ORAL | 0 refills | Status: DC

## 2022-02-14 MED ORDER — CLONIDINE HCL 0.1 MG PO TABS: ORAL_TABLET | ORAL | 0 refills | Status: DC

## 2022-02-14 MED ORDER — GABAPENTIN 100 MG PO CAPS: 200.0000 mg | ORAL_CAPSULE | Freq: Two times a day (BID) | ORAL | 0 refills | Status: AC

## 2022-02-14 MED ORDER — PANTOPRAZOLE SODIUM 40 MG PO TBEC: 40.0000 mg | DELAYED_RELEASE_TABLET | Freq: Every day | ORAL | 0 refills | Status: AC

## 2022-02-14 MED ORDER — BACLOFEN 10 MG PO TABS: 10.0000 mg | ORAL_TABLET | Freq: Three times a day (TID) | ORAL | 0 refills | Status: AC

## 2022-02-14 NOTE — Progress Notes (Signed)
Established patient visit   Patient: Katrina Turner   DOB: 09/05/1981   41 y.o. Female  MRN: [id number] Visit Date: 02/14/2022  Today's healthcare provider: Debera LatJanna Xachary Hambly, PA-C   Chief Complaint  Patient presents with   Follow-up   Subjective    HPI   Patient is here to establish care. Last clinic visit was on  Follow up for Bipolar 1 Disorder  The patient was last seen for this 1 years ago. Changes made at last visit include referral to psychiatry, continue Cymbalta 60mg  .Patient states that she went to rehab for 40 days and medication was discontinued and changed to  Effexor 27.5mg  BID , Seroquel 25mg  Seroquel 100mg , Vistaril 50mg  and Propranolol 10mg    She reports excellent compliance with treatment. She feels that condition is Improved. She is having side effects. Patient reports issues with constipation on medications. Taking Colace for constipation with relief.  -----------------------------------------------------------------------------------------  Follow up for Insomnia  The patient was last seen for this 1 years ago. Changes made at last visit include none, continue Trazodone. Patient states that when she went to rehab they changed Trazodone to Hydroxyzine 50 mg   She reports excellent compliance with treatment. She feels that condition is Improved. She is not having side effects.   -----------------------------------------------------------------------------------------  Patient complaints of severe migraines. She is taking baclofen. Was started by [place] provider from [place].  She reports numbness and pain in her feet. Taking gabapentin.  Reports problems with anxiety and restless leg syndrome.; taking clonidine. Was started by a provider from rehab program in [place].  For control of GERD symptoms and after by pass surgery, she has been taking PPI.  She was started on clonidine for HTN control.  Medications: Outpatient Medications Prior  to Visit  Medication Sig   albuterol (VENTOLIN HFA) 108 (90 Base) MCG/ACT inhaler Inhale 1-2 puffs into the lungs every 6 (six) hours as needed for wheezing or shortness of breath (excercise induced asthma).   docusate sodium (COLACE) 100 MG capsule Take 100 mg by mouth 2 (two) times daily.   fluticasone (FLONASE) 50 MCG/ACT nasal spray Place into both nostrils.   naltrexone (DEPADE) 50 MG tablet Take 50 mg by mouth daily.   prazosin (MINIPRESS) 1 MG capsule Take 1 mg by mouth at bedtime.   propranolol (INDERAL) 10 MG tablet Take 10 mg by mouth 3 (three) times daily.   rOPINIRole (REQUIP) 0.5 MG tablet Take 0.5 mg by mouth at bedtime.   traZODone (DESYREL) 50 MG tablet Take (1/2) tablet (25 mg total) by mouth once daily at bedtime.   [DISCONTINUED] baclofen (LIORESAL) 10 MG tablet Take 10 mg by mouth 3 (three) times daily.   [DISCONTINUED] cetirizine (ZYRTEC ALLERGY) 10 MG tablet Take 1 tablet (10 mg total) by mouth once daily.   [DISCONTINUED] cloNIDine (CATAPRES) 0.1 MG tablet Take 0.2 mg by mouth 2 (two) times daily.   [DISCONTINUED] gabapentin (NEURONTIN) 100 MG capsule Take 2 capsules (200 mg total) by mouth 2 (two) times daily.   [DISCONTINUED] hydrOXYzine (VISTARIL) 50 MG capsule Take 50 mg by mouth 3 (three) times daily.   [DISCONTINUED] pantoprazole (PROTONIX) 40 MG tablet Take 1 tablet (40 mg total) by mouth once daily.   [DISCONTINUED] QUEtiapine (SEROQUEL) 100 MG tablet Take 100 mg by mouth at bedtime.   [DISCONTINUED] QUEtiapine (SEROQUEL) 25 MG tablet Take 50 mg by mouth 2 (two) times daily.   [DISCONTINUED] QUEtiapine (SEROQUEL) 50 MG tablet Take 50 mg by mouth  at bedtime.   [DISCONTINUED] venlafaxine (EFFEXOR) 37.5 MG tablet Take 37.5 mg by mouth 2 (two) times daily.   SUMAtriptan (IMITREX) 50 MG tablet Take 1 tablet (50 mg total) by mouth once daily for 1 dose. May repeat in 2 hours if headache persists or recurs. (Max of 2 doses in 24 hours).   [DISCONTINUED] DULoxetine  (CYMBALTA) 20 MG capsule Take 1 capsule (20 mg total) by mouth daily.   No facility-administered medications prior to visit.    Review of Systems  Constitutional: Negative.   HENT: Negative.    Eyes: Negative.   Endocrine: Negative.   Skin: Negative.   All other systems reviewed and are negative. See HPI    Objective    There were no vitals taken for this visit.  Physical Exam  Gen: well appearing, in NAD Card: RRR Lungs: CTAB Ext: WWP, no edema   Assessment & Plan     Per patient, returned from  program for treatment of "personality d/o, schizophrenia and bipolar" in [place] . Issues with insurance were resolved Per chart review, She was seen at ED on 12/12/20 for alcohol intoxication and was sent to alcohol dependence program but there were some issues with health insurance.  Patient signed a release form for an inquiry to rehab program in [place]. We planned to proceed with a temporary medication refill for a month with urgent referral to psychiatry. Consulted with a supervision physician. All medication were prescribed by a provider from rehab program in [place]. Medications will be adjusted and discontinue accordingly post [facility] consultation or reply from rehab program in [place].  1. Bipolar 1 disorder (HCC) - QUEtiapine (SEROQUEL) 100 MG tablet; Take 1 tablet (100 mg total) by mouth at bedtime.  Dispense: 30 tablet; Refill: 0 - Ambulatory referral to Psychiatry.  - Patient is currently on a multidrug therapy. Patient needs a proper adjustment. 2. Neuropathy/Numbness and tingling of both feet - gabapentin (NEURONTIN) 100 MG capsule; Take 2 capsules (200 mg total) by mouth 2 (two) times daily.  Dispense: 120 capsule; Refill: 0 - Ambulatory referral to Neurology - Comprehensive Metabolic Panel (CMET)  3. Insomnia, unspecified type Trazodone was d/c in [place] - hydrOXYzine (VISTARIL) 50 MG capsule; Take 1 capsule (50 mg total) by mouth 3 (three) times daily.  Dispense: 30  capsule; Refill: 0 - Ambulatory referral to Neurology  4 Chronic migraines without aura without status migrainosus/Low back pain radiating to both legs - baclofen (LIORESAL) 10 MG tablet; Take 1 tablet (10 mg total) by mouth 3 (three) times daily.  Dispense: 30 each; Refill: 0 Medication was prescribed by provider from rehab program in [place], per patient. Planned to change this medication to propanolol for symptoms relief.  - CBC w/Diff/Platelet - Alcohol level ordered. - Ambulatory referral to Neurology for evaluation. We will follow up with the adjusted medication regimen  5. Substance induced mood disorder (HCC)  - venlafaxine (EFFEXOR) 37.5 MG tablet; Take 1 tablet (37.5 mg total) by mouth 2 (two) times daily.  Dispense: 60 tablet; Refill: 0 - hydrOXYzine (VISTARIL) 50 MG capsule; Take 1 capsule (50 mg total) by mouth 3 (three) times daily.  Dispense: 30 capsule; Refill: 0 - cloNIDine (CATAPRES) 0.1 MG tablet; Take 1 tab daily  Dispense: 30 tablet; Refill: 0 Medication was prescribed by provider from rehab program in [place], per patient. Planned to change this medication to propranolol for symptoms relief.  - Ambulatory referral to Psychiatry   6. History of gastroesophageal reflux (GERD) - pantoprazole (PROTONIX) 40  MG tablet; Take 1 tablet (40 mg total) by mouth once daily.  Dispense: 30 tablet; Refill: 0  7. Essential hypertension/Restless leg syndrome/Anxiety - cloNIDine (CATAPRES) 0.1 MG tablet; Take 1 tab daily  Dispense: 30 tablet; Refill: 0 Medication was prescribed by a provider from rehab program in [place]. Planned to changed this medication to propanolol Will check her Hb/Hct - CBC ordered    8. Polysubstance abuse - alcohol level ordered. - Urine drug test ordered - Pregnancy test ordered The patient was advised to call back or seek an in-person evaluation if the symptoms worsen or if the condition fails to improve as anticipated.  I discussed the assessment and  treatment plan with the patient, his spouse and my supervising physician. The patient was provided an opportunity to ask questions and all were answered. The patient agreed with the plan and demonstrated an understanding of the instructions.  The entirety of the information documented in the History of Present Illness, Review of Systems and Physical Exam were personally obtained by me. Portions of this information were initially documented by the CMA and reviewed by me for thoroughness and accuracy.    I,Kathleen J Wolford,acting as a Neurosurgeon for [employer], PA-C.,have documented all relevant documentation on the behalf of Debera Lat, PA-C,as directed by  [employer], PA-C while in the presence of [employer], PA-C.   Debera Lat, PA-C  [facility] [phone] (phone) [phone] (fax)  [facility]

## 2022-02-14 NOTE — Progress Notes (Deleted)
?  ? ? ?  Established patient visit ? ? ?Patient: Katrina Turner   DOB: [birth year]   41 y.o. Female  MRN: [id number] ?Visit Date: 02/14/2022 ? ?Today's healthcare provider: Mardene Speak, PA-C  ? ?No chief complaint on file. ? ?Subjective  ?  ?HPI  ?Depression, Follow-up ? ?She  was last seen for this 1 years ago. ?Changes made at last visit include referral to psychiatry and continuing on Cymbalta 20mg . ?  ?She reports {excellent/good/fair/poor:19665} compliance with treatment. ?She {is/is not:21021397} having side effects. *** ? ?She reports {DESC; GOOD/FAIR/POOR:18685} tolerance of treatment. ?Current symptoms include: {Symptoms; depression:1002} ?She feels she is {improved/worse/unchanged:3041574} since last visit. ? ?No flowsheet data found.  ?-----------------------------------------------------------------------------------------  ?Follow up for Insomnia ? ?The patient was last seen for this 1 years ago. ?Changes made at last visit include none. ? ?She reports {excellent/good/fair/poor:19665} compliance with treatment. ?She feels that condition is {improved/worse/unchanged:3041574}. ?She {is/is not:21021397} having side effects. *** ? ?-----------------------------------------------------------------------------------------  ? ?Medications: ?Outpatient Medications Prior to Visit  ?Medication Sig  ? albuterol (VENTOLIN HFA) 108 (90 Base) MCG/ACT inhaler Inhale 1-2 puffs into the lungs every 6 (six) hours as needed for wheezing or shortness of breath (excercise induced asthma).  ? cetirizine (ZYRTEC ALLERGY) 10 MG tablet Take 1 tablet (10 mg total) by mouth once daily. (Patient not taking: Reported on 12/12/2021)  ? DULoxetine (CYMBALTA) 20 MG capsule Take 1 capsule (20 mg total) by mouth daily.  ? gabapentin (NEURONTIN) 100 MG capsule Take 2 capsules (200 mg total) by mouth 2 (two) times daily. (Patient not taking: Reported on 12/12/2021)  ? pantoprazole (PROTONIX) 40 MG tablet Take 1 tablet (40 mg total) by mouth once  daily. (Patient not taking: Reported on 12/12/2021)  ? SUMAtriptan (IMITREX) 50 MG tablet Take 1 tablet (50 mg total) by mouth once daily for 1 dose. May repeat in 2 hours if headache persists or recurs. (Max of 2 doses in 24 hours).  ? traZODone (DESYREL) 50 MG tablet Take (1/2) tablet (25 mg total) by mouth once daily at bedtime. (Patient not taking: Reported on 12/12/2021)  ? ?No facility-administered medications prior to visit.  ? ? ?Review of Systems ? ?{Labs  Heme  Chem  Endocrine  Serology  Results Review (optional):23779} ?  Objective  ?  ?There were no vitals taken for this visit. ?{Show previous vital signs (optional):23777} ? ?Physical Exam  ?*** ? ?No results found for any visits on 02/14/22. ? Assessment & Plan  ?  ? ?*** ? ?No follow-ups on file.  ?   ? ?{provider attestation***:1} ? ? ?Mardene Speak, PA-C  ?[place] ?[phone] (phone) ?[phone] (fax) ? ? [facility] Group ?

## 2022-02-16 MED ORDER — PROPRANOLOL HCL 10 MG PO TABS: 10.0000 mg | ORAL_TABLET | Freq: Three times a day (TID) | ORAL | 0 refills | Status: DC

## 2022-02-18 ENCOUNTER — Telehealth: Payer: Self-pay

## 2022-02-18 NOTE — Telephone Encounter (Signed)
Contacted patient because I need her to RTC for urine drug screen, appt is not needed she can come by the office M-F from 8-5 to give urine, also I need patient to sign a medical release form that we did not get her to sign at visit so I can get medical records from rehabilitation center she was at. Patient needs to be advised that she cannot take Baclofen and Clonidine together because of interaction of medication, Katrina Turner. A new prescription was sent in for Propranolol and sent to [facility] pharmacy. If patient returns call okay for [facility] nurse triage to advise of message. KW ?
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
## 2022-03-11 ENCOUNTER — Telehealth: Payer: BC Managed Care – PPO | Admitting: Physician Assistant

## 2022-03-13 LAB — CBC WITH DIFFERENTIAL/PLATELET
Basophils Absolute: 0 10*3/uL (ref 0.0–0.2)
Basos: 1 %
EOS (ABSOLUTE): 0.4 10*3/uL (ref 0.0–0.4)
Eos: 9 %
Hematocrit: 32.3 % — ABNORMAL LOW (ref 34.0–46.6)
Hemoglobin: 10.5 g/dL — ABNORMAL LOW (ref 11.1–15.9)
Immature Grans (Abs): 0 10*3/uL (ref 0.0–0.1)
Immature Granulocytes: 0 %
Lymphocytes Absolute: 1.7 10*3/uL (ref 0.7–3.1)
Lymphs: 41 %
MCH: 25.4 pg — ABNORMAL LOW (ref 26.6–33.0)
MCHC: 32.5 g/dL (ref 31.5–35.7)
MCV: 78 fL — ABNORMAL LOW (ref 79–97)
Monocytes Absolute: 0.7 10*3/uL (ref 0.1–0.9)
Monocytes: 17 %
Neutrophils Absolute: 1.3 10*3/uL — ABNORMAL LOW (ref 1.4–7.0)
Neutrophils: 32 %
Platelets: 262 10*3/uL (ref 150–450)
RBC: 4.13 x10E6/uL (ref 3.77–5.28)
RDW: 18.3 % — ABNORMAL HIGH (ref 11.7–15.4)
WBC: 4.1 10*3/uL (ref 3.4–10.8)

## 2022-03-13 LAB — COMPREHENSIVE METABOLIC PANEL
ALT: 11 IU/L (ref 0–32)
AST: 17 IU/L (ref 0–40)
Albumin/Globulin Ratio: 1.6 (ref 1.2–2.2)
Albumin: 4 g/dL (ref 3.8–4.8)
Alkaline Phosphatase: 148 IU/L — ABNORMAL HIGH (ref 44–121)
BUN/Creatinine Ratio: 11 (ref 9–23)
BUN: 8 mg/dL (ref 6–24)
Bilirubin Total: 0.2 mg/dL (ref 0.0–1.2)
CO2: 22 mmol/L (ref 20–29)
Calcium: 9.2 mg/dL (ref 8.7–10.2)
Chloride: 108 mmol/L — ABNORMAL HIGH (ref 96–106)
Creatinine, Ser: 0.71 mg/dL (ref 0.57–1.00)
Globulin, Total: 2.5 g/dL (ref 1.5–4.5)
Glucose: 100 mg/dL — ABNORMAL HIGH (ref 70–99)
Potassium: 4 mmol/L (ref 3.5–5.2)
Sodium: 145 mmol/L — ABNORMAL HIGH (ref 134–144)
Total Protein: 6.5 g/dL (ref 6.0–8.5)
eGFR: 109 mL/min/{1.73_m2} (ref >59)

## 2022-03-14 ENCOUNTER — Other Ambulatory Visit: Payer: Self-pay | Admitting: Physician Assistant

## 2022-03-14 DIAGNOSIS — F319 Bipolar disorder, unspecified: Secondary | ICD-10-CM

## 2022-03-14 NOTE — Telephone Encounter (Signed)
LOV 02-14-22 ?NOV 03-26-22 ?LR  02-14-22 #30 no refills  ?

## 2022-03-17 ENCOUNTER — Other Ambulatory Visit: Payer: Self-pay | Admitting: Physician Assistant

## 2022-03-17 DIAGNOSIS — F1910 Other psychoactive substance abuse, uncomplicated: Secondary | ICD-10-CM

## 2022-03-17 DIAGNOSIS — F1994 Other psychoactive substance use, unspecified with psychoactive substance-induced mood disorder: Secondary | ICD-10-CM

## 2022-03-17 DIAGNOSIS — F319 Bipolar disorder, unspecified: Secondary | ICD-10-CM

## 2022-03-18 ENCOUNTER — Other Ambulatory Visit: Payer: Self-pay | Admitting: Physician Assistant

## 2022-03-18 DIAGNOSIS — I10 Essential (primary) hypertension: Secondary | ICD-10-CM

## 2022-03-18 DIAGNOSIS — F1994 Other psychoactive substance use, unspecified with psychoactive substance-induced mood disorder: Secondary | ICD-10-CM

## 2022-03-18 DIAGNOSIS — G43709 Chronic migraine without aura, not intractable, without status migrainosus: Secondary | ICD-10-CM

## 2022-03-21 ENCOUNTER — Ambulatory Visit: Payer: BC Managed Care – PPO | Admitting: Physician Assistant

## 2022-03-26 ENCOUNTER — Ambulatory Visit: Payer: BC Managed Care – PPO | Admitting: Physician Assistant

## 2022-04-01 ENCOUNTER — Telehealth (HOSPITAL_COMMUNITY): Payer: Self-pay | Admitting: Licensed Clinical Social Worker

## 2022-04-01 NOTE — Telephone Encounter (Signed)
The therapist messages Ms. Jethro Bastos, PA-C in order to verify that she is referring this patient to this office to see a Psychiatrist and to see if any additional level of care is being requested.  ? ?As the patient reportedly has issues with substance use, this therapist attempts to outreach her leaving a HIPAA-compiant voicemail with his direct callback number which is "[phone]." ? ?Myrna Blazer, MA, LCSW, [facility], LCAS ?04/01/2022 ?

## 2022-04-04 ENCOUNTER — Other Ambulatory Visit: Payer: Self-pay | Admitting: Physician Assistant

## 2022-04-04 DIAGNOSIS — G4700 Insomnia, unspecified: Secondary | ICD-10-CM

## 2022-04-04 DIAGNOSIS — R200 Anesthesia of skin: Secondary | ICD-10-CM

## 2022-04-04 DIAGNOSIS — M79604 Pain in right leg: Secondary | ICD-10-CM

## 2022-04-04 DIAGNOSIS — F319 Bipolar disorder, unspecified: Secondary | ICD-10-CM

## 2022-04-07 NOTE — Telephone Encounter (Signed)
Called and spoke with pt. States she missed her last appt because she didn't have the copay.  States she has an appt with Behavioral Health on 05-28-22 and is requesting refill for requip and gabapentin to get her to that appt if possible.  States she has some left of the other meds, but really needs these two.  Please advise.   ?

## 2022-04-07 NOTE — Telephone Encounter (Signed)
Per Janna's verbal pt needs BP checked before she will prescribe meds.  [facility]  pt needs appt with Letitia Libra or nurse visit to check BP.  Okay for [facility] triage to advise.   ?

## 2022-04-08 NOTE — Telephone Encounter (Signed)
Patient advised. Patient plans to come by the office some time tomorrow. She is unsure what time she will come, but states she will call to make sure a CMA is available.  ?

## 2022-04-10 ENCOUNTER — Other Ambulatory Visit: Payer: Self-pay

## 2022-04-10 NOTE — Telephone Encounter (Signed)
FYI ?Pt has not come in for BP check and not contacted office.   ?

## 2022-04-23 ENCOUNTER — Ambulatory Visit (INDEPENDENT_AMBULATORY_CARE_PROVIDER_SITE_OTHER): Payer: Self-pay | Admitting: Physician Assistant

## 2022-04-23 ENCOUNTER — Ambulatory Visit: Payer: Self-pay | Admitting: *Deleted

## 2022-04-23 ENCOUNTER — Encounter: Payer: Self-pay | Admitting: Physician Assistant

## 2022-04-23 ENCOUNTER — Telehealth: Payer: Self-pay

## 2022-04-23 VITALS — BP 122/93 | HR 79 | Temp 98.2°F | Resp 16 | Wt 171.0 lb

## 2022-04-23 DIAGNOSIS — R32 Unspecified urinary incontinence: Secondary | ICD-10-CM

## 2022-04-23 NOTE — Progress Notes (Signed)
?  ? ?  I,Roshena L Chambers,acting as a Education administrator for [employer], PA-C.,have documented all relevant documentation on the behalf of Mardene Speak, PA-C,as directed by  [employer], PA-C while in the presence of [employer], PA-C.  ? ?Established patient visit ? ? ?Patient: Katrina Turner   DOB: [birth year]   41 y.o. Female  MRN: [id number] ?Visit Date: 04/23/2022 ? ?Today's healthcare provider: Mardene Speak, PA-C  ? ? ?Patient left without being seen ? ? ? ? ?

## 2022-04-23 NOTE — Telephone Encounter (Signed)
Copied from [place] [phone]. Topic: Appointment Scheduling - Scheduling Inquiry for Clinic ?>> Apr 23, 2022  9:11 AM Oneta Rack wrote: ?Patient has a 1 pm appointment with PCP and does not have insurance because spouse lost his job therefore insurance was cancelled. Patient has no money to pay the self pay. ?
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
## 2022-04-25 ENCOUNTER — Encounter: Payer: Self-pay | Admitting: Emergency Medicine

## 2022-04-25 ENCOUNTER — Emergency Department
Admission: EM | Admit: 2022-04-25 | Discharge: 2022-04-25 | Discharge status: Against Medical Advice | Payer: BC Managed Care – PPO | Attending: Emergency Medicine | Admitting: Emergency Medicine

## 2022-04-25 DIAGNOSIS — R1031 Right lower quadrant pain: Secondary | ICD-10-CM | POA: Insufficient documentation

## 2022-04-25 DIAGNOSIS — R112 Nausea with vomiting, unspecified: Secondary | ICD-10-CM | POA: Diagnosis not present

## 2022-04-25 DIAGNOSIS — Z5321 Procedure and treatment not carried out due to patient leaving prior to being seen by health care provider: Secondary | ICD-10-CM | POA: Insufficient documentation

## 2022-04-25 LAB — CBC
HCT: 39.5 % (ref 36.0–46.0)
Hemoglobin: 12.6 g/dL (ref 12.0–15.0)
MCH: 24.9 pg — ABNORMAL LOW (ref 26.0–34.0)
MCHC: 31.9 g/dL (ref 30.0–36.0)
MCV: 78.1 fL — ABNORMAL LOW (ref 80.0–100.0)
Platelets: 393 10*3/uL (ref 150–400)
RBC: 5.06 MIL/uL (ref 3.87–5.11)
RDW: 20.5 % — ABNORMAL HIGH (ref 11.5–15.5)
WBC: 6.1 10*3/uL (ref 4.0–10.5)
nRBC: 0 % (ref 0.0–0.2)

## 2022-04-25 LAB — URINALYSIS, ROUTINE W REFLEX MICROSCOPIC
Bilirubin Urine: NEGATIVE
Glucose, UA: NEGATIVE mg/dL
Hgb urine dipstick: NEGATIVE
Ketones, ur: NEGATIVE mg/dL
Nitrite: POSITIVE — AB
Protein, ur: NEGATIVE mg/dL
Specific Gravity, Urine: 1.014 (ref 1.005–1.030)
pH: 5 (ref 5.0–8.0)

## 2022-04-25 LAB — COMPREHENSIVE METABOLIC PANEL
ALT: 16 U/L (ref 0–44)
AST: 30 U/L (ref 15–41)
Albumin: 4 g/dL (ref 3.5–5.0)
Alkaline Phosphatase: 132 U/L — ABNORMAL HIGH (ref 38–126)
Anion gap: 13 (ref 5–15)
BUN: 14 mg/dL (ref 6–20)
CO2: 25 mmol/L (ref 22–32)
Calcium: 8.8 mg/dL — ABNORMAL LOW (ref 8.9–10.3)
Chloride: 101 mmol/L (ref 98–111)
Creatinine, Ser: 0.89 mg/dL (ref 0.44–1.00)
GFR, Estimated: >60 mL/min (ref >60)
Glucose, Bld: 107 mg/dL — ABNORMAL HIGH (ref 70–99)
Potassium: 4.6 mmol/L (ref 3.5–5.1)
Sodium: 139 mmol/L (ref 135–145)
Total Bilirubin: 0.3 mg/dL (ref 0.3–1.2)
Total Protein: 7.6 g/dL (ref 6.5–8.1)

## 2022-04-25 LAB — LIPASE, BLOOD: Lipase: 37 U/L (ref 11–51)

## 2022-04-25 LAB — POC URINE PREG, ED: Preg Test, Ur: NEGATIVE
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
## 2022-04-25 NOTE — ED Notes (Signed)
EDT, [facility], notified this writer of pts leaving.  Pts dispo to be changed at this time.

## 2022-04-25 NOTE — ED Triage Notes (Addendum)
Pt presents via POV with complaints of RLQ that started this AM with associated N/V. Pt has a strong odor of ETOH and is tearful in triage. Per friend, she noted that the patient only drank one beer in her presence tonight but has a hx of alcohol abuse.

## 2022-05-28 ENCOUNTER — Telehealth (HOSPITAL_COMMUNITY): Payer: Self-pay | Admitting: Psychiatry

## 2022-06-10 ENCOUNTER — Emergency Department: Payer: BC Managed Care – PPO

## 2022-06-10 ENCOUNTER — Emergency Department
Admission: EM | Admit: 2022-06-10 | Discharge: 2022-06-10 | Disposition: A | Discharge status: Home / Self Care | Payer: BC Managed Care – PPO | Attending: Emergency Medicine | Admitting: Emergency Medicine

## 2022-06-10 DIAGNOSIS — M79604 Pain in right leg: Secondary | ICD-10-CM | POA: Insufficient documentation

## 2022-06-10 DIAGNOSIS — I10 Essential (primary) hypertension: Secondary | ICD-10-CM | POA: Insufficient documentation

## 2022-06-10 DIAGNOSIS — Y92410 Unspecified street and highway as the place of occurrence of the external cause: Secondary | ICD-10-CM | POA: Insufficient documentation

## 2022-06-10 DIAGNOSIS — F10129 Alcohol abuse with intoxication, unspecified: Secondary | ICD-10-CM | POA: Insufficient documentation

## 2022-06-10 DIAGNOSIS — F10920 Alcohol use, unspecified with intoxication, uncomplicated: Secondary | ICD-10-CM

## 2022-06-10 DIAGNOSIS — M79605 Pain in left leg: Secondary | ICD-10-CM | POA: Insufficient documentation

## 2022-06-10 DIAGNOSIS — Y907 Blood alcohol level of 200-239 mg/100 ml: Secondary | ICD-10-CM | POA: Insufficient documentation

## 2022-06-10 LAB — URINALYSIS, ROUTINE W REFLEX MICROSCOPIC
Bilirubin Urine: NEGATIVE
Glucose, UA: NEGATIVE mg/dL
Hgb urine dipstick: NEGATIVE
Ketones, ur: NEGATIVE mg/dL
Nitrite: POSITIVE — AB
Protein, ur: NEGATIVE mg/dL
Specific Gravity, Urine: 1.01 (ref 1.005–1.030)
pH: 5 (ref 5.0–8.0)

## 2022-06-10 LAB — CBC
HCT: 36 % (ref 36.0–46.0)
Hemoglobin: 11.2 g/dL — ABNORMAL LOW (ref 12.0–15.0)
MCH: 25.9 pg — ABNORMAL LOW (ref 26.0–34.0)
MCHC: 31.1 g/dL (ref 30.0–36.0)
MCV: 83.3 fL (ref 80.0–100.0)
Platelets: 422 10*3/uL — ABNORMAL HIGH (ref 150–400)
RBC: 4.32 MIL/uL (ref 3.87–5.11)
RDW: 19.1 % — ABNORMAL HIGH (ref 11.5–15.5)
WBC: 6.4 10*3/uL (ref 4.0–10.5)
nRBC: 0 % (ref 0.0–0.2)

## 2022-06-10 LAB — BASIC METABOLIC PANEL
Anion gap: 9 (ref 5–15)
BUN: 11 mg/dL (ref 6–20)
CO2: 25 mmol/L (ref 22–32)
Calcium: 8.8 mg/dL — ABNORMAL LOW (ref 8.9–10.3)
Chloride: 104 mmol/L (ref 98–111)
Creatinine, Ser: 0.71 mg/dL (ref 0.44–1.00)
GFR, Estimated: >60 mL/min (ref >60)
Glucose, Bld: 98 mg/dL (ref 70–99)
Potassium: 4.8 mmol/L (ref 3.5–5.1)
Sodium: 138 mmol/L (ref 135–145)

## 2022-06-10 LAB — ETHANOL: Alcohol, Ethyl (B): 234 mg/dL — ABNORMAL HIGH (ref <10)

## 2022-06-10 NOTE — ED Notes (Signed)
Pt husband waiting with pt at this time

## 2022-06-10 NOTE — ED Notes (Signed)
Pt A&O, pt given discharge instructions, pt ambulating with steady gait. 

## 2022-06-10 NOTE — ED Notes (Signed)
Pt asked if she could have a snack. Pt informed not to eat until cleared by MD.
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
## 2022-06-10 NOTE — ED Notes (Signed)
Pt given crackers PB and water.
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
## 2022-07-08 ENCOUNTER — Other Ambulatory Visit: Payer: Self-pay

## 2022-07-08 ENCOUNTER — Encounter: Payer: Self-pay | Admitting: Emergency Medicine

## 2022-07-08 ENCOUNTER — Emergency Department: Payer: Medicaid Other

## 2022-07-08 ENCOUNTER — Emergency Department
Admission: EM | Admit: 2022-07-08 | Discharge: 2022-07-09 | Disposition: A | Discharge status: Home / Self Care | Payer: Medicaid Other | Attending: Emergency Medicine | Admitting: Emergency Medicine

## 2022-07-08 DIAGNOSIS — F1090 Alcohol use, unspecified, uncomplicated: Secondary | ICD-10-CM | POA: Diagnosis present

## 2022-07-08 DIAGNOSIS — I10 Essential (primary) hypertension: Secondary | ICD-10-CM | POA: Diagnosis present

## 2022-07-08 DIAGNOSIS — R202 Paresthesia of skin: Secondary | ICD-10-CM | POA: Insufficient documentation

## 2022-07-08 DIAGNOSIS — F17210 Nicotine dependence, cigarettes, uncomplicated: Secondary | ICD-10-CM | POA: Insufficient documentation

## 2022-07-08 DIAGNOSIS — F10129 Alcohol abuse with intoxication, unspecified: Secondary | ICD-10-CM | POA: Diagnosis present

## 2022-07-08 DIAGNOSIS — F609 Personality disorder, unspecified: Secondary | ICD-10-CM | POA: Diagnosis present

## 2022-07-08 DIAGNOSIS — IMO0002 Reserved for concepts with insufficient information to code with codable children: Secondary | ICD-10-CM | POA: Diagnosis present

## 2022-07-08 DIAGNOSIS — K861 Other chronic pancreatitis: Secondary | ICD-10-CM | POA: Diagnosis present

## 2022-07-08 DIAGNOSIS — F332 Major depressive disorder, recurrent severe without psychotic features: Secondary | ICD-10-CM | POA: Diagnosis present

## 2022-07-08 DIAGNOSIS — Z7689 Persons encountering health services in other specified circumstances: Secondary | ICD-10-CM

## 2022-07-08 DIAGNOSIS — Z789 Other specified health status: Secondary | ICD-10-CM | POA: Diagnosis present

## 2022-07-08 DIAGNOSIS — R200 Anesthesia of skin: Secondary | ICD-10-CM | POA: Diagnosis present

## 2022-07-08 DIAGNOSIS — F32A Depression, unspecified: Secondary | ICD-10-CM

## 2022-07-08 DIAGNOSIS — R600 Localized edema: Secondary | ICD-10-CM | POA: Insufficient documentation

## 2022-07-08 DIAGNOSIS — F319 Bipolar disorder, unspecified: Secondary | ICD-10-CM | POA: Diagnosis present

## 2022-07-08 DIAGNOSIS — M329 Systemic lupus erythematosus, unspecified: Secondary | ICD-10-CM | POA: Diagnosis present

## 2022-07-08 DIAGNOSIS — L930 Discoid lupus erythematosus: Secondary | ICD-10-CM | POA: Insufficient documentation

## 2022-07-08 DIAGNOSIS — F10920 Alcohol use, unspecified with intoxication, uncomplicated: Secondary | ICD-10-CM | POA: Diagnosis present

## 2022-07-08 DIAGNOSIS — F1010 Alcohol abuse, uncomplicated: Secondary | ICD-10-CM | POA: Diagnosis present

## 2022-07-08 DIAGNOSIS — F17200 Nicotine dependence, unspecified, uncomplicated: Secondary | ICD-10-CM | POA: Diagnosis present

## 2022-07-08 DIAGNOSIS — Y907 Blood alcohol level of 200-239 mg/100 ml: Secondary | ICD-10-CM | POA: Insufficient documentation

## 2022-07-08 DIAGNOSIS — F1910 Other psychoactive substance abuse, uncomplicated: Secondary | ICD-10-CM | POA: Diagnosis present

## 2022-07-08 DIAGNOSIS — Z8659 Personal history of other mental and behavioral disorders: Secondary | ICD-10-CM

## 2022-07-08 LAB — URINE DRUG SCREEN, QUALITATIVE (ARMC ONLY)
Amphetamines, Ur Screen: NOT DETECTED
Barbiturates, Ur Screen: NOT DETECTED
Benzodiazepine, Ur Scrn: NOT DETECTED
Cannabinoid 50 Ng, Ur ~~LOC~~: NOT DETECTED
Cocaine Metabolite,Ur ~~LOC~~: NOT DETECTED
MDMA (Ecstasy)Ur Screen: NOT DETECTED
Methadone Scn, Ur: NOT DETECTED
Opiate, Ur Screen: NOT DETECTED
Phencyclidine (PCP) Ur S: NOT DETECTED
Tricyclic, Ur Screen: NOT DETECTED

## 2022-07-08 LAB — POC URINE PREG, ED: Preg Test, Ur: NEGATIVE

## 2022-07-08 LAB — MAGNESIUM: Magnesium: 2.1 mg/dL (ref 1.7–2.4)

## 2022-07-08 LAB — CBC
HCT: 35.8 % — ABNORMAL LOW (ref 36.0–46.0)
Hemoglobin: 11.7 g/dL — ABNORMAL LOW (ref 12.0–15.0)
MCH: 26.5 pg (ref 26.0–34.0)
MCHC: 32.7 g/dL (ref 30.0–36.0)
MCV: 81 fL (ref 80.0–100.0)
Platelets: 294 10*3/uL (ref 150–400)
RBC: 4.42 MIL/uL (ref 3.87–5.11)
RDW: 18.2 % — ABNORMAL HIGH (ref 11.5–15.5)
WBC: 6 10*3/uL (ref 4.0–10.5)
nRBC: 0 % (ref 0.0–0.2)

## 2022-07-08 LAB — COMPREHENSIVE METABOLIC PANEL
ALT: 14 U/L (ref 0–44)
AST: 25 U/L (ref 15–41)
Albumin: 3.8 g/dL (ref 3.5–5.0)
Alkaline Phosphatase: 152 U/L — ABNORMAL HIGH (ref 38–126)
Anion gap: 12 (ref 5–15)
BUN: 8 mg/dL (ref 6–20)
CO2: 20 mmol/L — ABNORMAL LOW (ref 22–32)
Calcium: 8.4 mg/dL — ABNORMAL LOW (ref 8.9–10.3)
Chloride: 103 mmol/L (ref 98–111)
Creatinine, Ser: 0.68 mg/dL (ref 0.44–1.00)
GFR, Estimated: >60 mL/min (ref >60)
Glucose, Bld: 155 mg/dL — ABNORMAL HIGH (ref 70–99)
Potassium: 3.5 mmol/L (ref 3.5–5.1)
Sodium: 135 mmol/L (ref 135–145)
Total Bilirubin: 0.2 mg/dL — ABNORMAL LOW (ref 0.3–1.2)
Total Protein: 7.5 g/dL (ref 6.5–8.1)

## 2022-07-08 LAB — ETHANOL: Alcohol, Ethyl (B): 222 mg/dL — ABNORMAL HIGH (ref <10)

## 2022-07-08 MED ORDER — QUETIAPINE FUMARATE 25 MG PO TABS
100.0000 mg | ORAL_TABLET | Freq: Every day | ORAL | Status: DC
Start: 2022-07-08 — End: 2022-07-09
  Administered 2022-07-08: 100 mg via ORAL
  Filled 2022-07-08: qty 4

## 2022-07-08 MED ORDER — VENLAFAXINE HCL 37.5 MG PO TABS
37.5000 mg | ORAL_TABLET | Freq: Two times a day (BID) | ORAL | Status: DC
  Administered 2022-07-08 – 2022-07-09 (×2): 37.5 mg via ORAL
  Filled 2022-07-08 (×3): qty 1

## 2022-07-08 MED ORDER — ROPINIROLE HCL 0.25 MG PO TABS
0.5000 mg | ORAL_TABLET | Freq: Every day | ORAL | Status: DC
  Administered 2022-07-08: 0.5 mg via ORAL
  Filled 2022-07-08: qty 2

## 2022-07-08 MED ORDER — PROPRANOLOL HCL 20 MG PO TABS
10.0000 mg | ORAL_TABLET | Freq: Three times a day (TID) | ORAL | Status: DC
  Administered 2022-07-08 – 2022-07-09 (×3): 10 mg via ORAL
  Filled 2022-07-08 (×3): qty 1

## 2022-07-08 MED ORDER — CLONIDINE HCL 0.1 MG PO TABS
0.1000 mg | ORAL_TABLET | Freq: Every day | ORAL | Status: DC
  Administered 2022-07-08 – 2022-07-09 (×2): 0.1 mg via ORAL
  Filled 2022-07-08 (×2): qty 1

## 2022-07-08 MED ORDER — THIAMINE HCL 100 MG PO TABS
100.0000 mg | ORAL_TABLET | Freq: Every day | ORAL | Status: DC
  Administered 2022-07-08 – 2022-07-09 (×2): 100 mg via ORAL
  Filled 2022-07-08 (×2): qty 1

## 2022-07-08 MED ORDER — PRAZOSIN HCL 1 MG PO CAPS
1.0000 mg | ORAL_CAPSULE | Freq: Every day | ORAL | Status: DC
  Administered 2022-07-08: 1 mg via ORAL
  Filled 2022-07-08: qty 1

## 2022-07-08 MED ORDER — PANTOPRAZOLE SODIUM 40 MG PO TBEC
40.0000 mg | DELAYED_RELEASE_TABLET | Freq: Every day | ORAL | Status: DC
Start: 2022-07-08 — End: 2022-07-09
  Administered 2022-07-08 – 2022-07-09 (×2): 40 mg via ORAL
  Filled 2022-07-08 (×2): qty 1
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
## 2022-07-08 NOTE — ED Notes (Signed)
VOL/  PENDING  CONSULT 

## 2022-07-08 NOTE — ED Notes (Deleted)
VOL/  PENDING  CONSULT 

## 2022-07-08 NOTE — ED Notes (Deleted)
VOL  PENDING  PLACEMENT 
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
## 2022-07-09 DIAGNOSIS — F1010 Alcohol abuse, uncomplicated: Secondary | ICD-10-CM

## 2022-07-09 DIAGNOSIS — F10920 Alcohol use, unspecified with intoxication, uncomplicated: Secondary | ICD-10-CM

## 2022-07-09 NOTE — ED Provider Notes (Signed)
Patient seen and cleared for discharge by psychiatry service.  Discharged in stable condition.   Gilles Chiquito, MD 07/09/22 1048

## 2022-07-09 NOTE — ED Notes (Signed)
Hospital meal provided, pt tolerated w/o complaints.  Waste discarded appropriately.  

## 2022-07-09 NOTE — ED Notes (Signed)
This RN reviewed paperwork with pt. No further complaints or questions. Pt ambulated to lobby to meet husband. Discharge form placed in med rec box.

## 2022-07-09 NOTE — ED Notes (Signed)
Call placed to pt's husband with no answer.

## 2022-07-09 NOTE — ED Notes (Signed)
Pt given blue scrubs to change into and will await ride in the lobby.

## 2022-07-09 NOTE — Consult Note (Signed)
[facility] Psych ED Progress Note  07/09/2022 11:01 AM Zakyah Tepedino  MRN:  [id number]   Method of visit?: Face to Face   Subjective:  "Alcohol is the demon." Re-assessment. Patient is alert and oriented x 3. Speaks in coherent sentences. Denies suicidal or homicidal ideations. Denies auditory to visual hallucinations. Does not appear to be responding to internal stimuli. Patient reports that she knows she needs to stop using alcohol. She says she knows she can go to [facility] for help with this, and states she will follow up with them.   Principal Problem: Alcohol abuse Diagnosis:  Principal Problem:   Alcohol abuse Active Problems:   Alcohol use   Bipolar 1 disorder (HCC)   Lupus ([place])   Tobacco use disorder   Chronic pancreatitis ([place])   Personality disorder ([place])   Essential hypertension   Alcoholic intoxication without complication (HCC)   Numbness and tingling of both feet   Encounter to establish care   History of anxiety   Polysubstance abuse ([place])   Severe recurrent major depression without psychotic features ([place])  Total Time spent with patient: 15 minutes  Past Psychiatric History: see previous  Past Medical History:  Past Medical History:  Diagnosis Date   Alcohol abuse    Allergy    Bipolar 1 disorder ([place])    Depression    GERD (gastroesophageal reflux disease)    Neuromuscular disorder ([place])    Pancreatitis 11/28/2019   approximated    Paranoid schizophrenia ([place])    PTSD (post-traumatic stress disorder)    Reynolds syndrome [facility])     Past Surgical History:  Procedure Laterality Date   APPENDECTOMY     CESAREAN SECTION     ESOPHAGOGASTRODUODENOSCOPY N/A 10/17/2020   Procedure: ESOPHAGOGASTRODUODENOSCOPY (EGD);  Surgeon: Toledo, Benay Pike, MD;  Location: [facility] ENDOSCOPY;  Service: Gastroenterology;  Laterality: N/A;   HERNIA REPAIR     PARTIAL HYSTERECTOMY     ovaries present not uterus- precancerous cells of uterus    ROUX-EN-Y GASTRIC BYPASS     Family  History:  Family History  Problem Relation Age of Onset   Heart attack Mother    Diabetes Father    Heart disease Father    Schizophrenia Brother    Autism Son    Pancreatic cancer Maternal Grandmother    Heart disease Maternal Grandfather    Seizures Paternal Grandmother    Heart disease Paternal Grandfather    Prostate cancer Paternal Grandfather    Family Psychiatric  History:  Social History:  Social History   Substance and Sexual Activity  Alcohol Use Yes   Alcohol/week: 36.0 standard drinks of alcohol   Types: 36 Cans of beer per week   Comment: pt states a qt. a day     Social History   Substance and Sexual Activity  Drug Use Not Currently    Social History   Socioeconomic History   Marital status: Married    Spouse name: Juanda Crumble   Number of children: 1   Years of education: Not on file   Highest education level: Not on file  Occupational History   Not on file  Tobacco Use   Smoking status: Every Day    Packs/day: 0.50    Years: 7.00    Total pack years: 3.50    Types: Cigarettes   Smokeless tobacco: Never  Vaping Use   Vaping Use: Never used  Substance and Sexual Activity   Alcohol use: Yes    Alcohol/week: 36.0 standard drinks of alcohol  Types: 36 Cans of beer per week    Comment: pt states a qt. a day   Drug use: Not Currently   Sexual activity: Yes  Other Topics Concern   Not on file  Social History Narrative   Not on file   Social Determinants of Health   Financial Resource Strain: Low Risk  (05/25/2019)   Overall Financial Resource Strain (CARDIA)    Difficulty of Paying Living Expenses: Not very hard  Food Insecurity: No Food Insecurity (09/05/2021)   Hunger Vital Sign    Worried About Running Out of Food in the Last Year: Never true    Ran Out of Food in the Last Year: Never true  Transportation Needs: No Transportation Needs (09/05/2021)   PRAPARE - Administrator, Civil Service (Medical): No    Lack of Transportation  (Non-Medical): No  Physical Activity: Unknown (05/25/2019)   Exercise Vital Sign    Days of Exercise per Week: Patient refused    Minutes of Exercise per Session: Patient refused  Stress: No Stress Concern Present (05/25/2019)   [employer] of Occupational Health - Occupational Stress Questionnaire    Feeling of Stress : Only a little  Social Connections: Unknown (05/25/2019)   Social Connection and Isolation Panel [NHANES]    Frequency of Communication with Friends and Family: Patient refused    Frequency of Social Gatherings with Friends and Family: Patient refused    Attends Religious Services: Patient refused    Database administrator or Organizations: Patient refused    Attends Banker Meetings: Patient refused    Marital Status: Patient refused    Sleep: Good  Appetite:  Good  Current Medications: Current Facility-Administered Medications  Medication Dose Route Frequency Provider Last Rate Last Admin   cloNIDine (CATAPRES) tablet 0.1 mg  0.1 mg Oral Daily Chesley Noon, MD   0.1 mg at 07/09/22 0920   pantoprazole (PROTONIX) EC tablet 40 mg  40 mg Oral Daily Chesley Noon, MD   40 mg at 07/09/22 0920   prazosin (MINIPRESS) capsule 1 mg  1 mg Oral QHS Chesley Noon, MD   1 mg at 07/08/22 2157   propranolol (INDERAL) tablet 10 mg  10 mg Oral TID Chesley Noon, MD   10 mg at 07/09/22 0920   QUEtiapine (SEROQUEL) tablet 100 mg  100 mg Oral QHS Chesley Noon, MD   100 mg at 07/08/22 2157   rOPINIRole (REQUIP) tablet 0.5 mg  0.5 mg Oral QHS Chesley Noon, MD   0.5 mg at 07/08/22 2157   thiamine (VITAMIN B1) tablet 100 mg  100 mg Oral Daily Chesley Noon, MD   100 mg at 07/09/22 0920   venlafaxine [facility]) tablet 37.5 mg  37.5 mg Oral BID WC Chesley Noon, MD   37.5 mg at 07/09/22 0920   Current Outpatient Medications  Medication Sig Dispense Refill   albuterol (VENTOLIN HFA) 108 (90 Base) MCG/ACT inhaler Inhale 1-2 puffs into the lungs every 6 (six)  hours as needed for wheezing or shortness of breath (excercise induced asthma). 6.7 g 2   baclofen (LIORESAL) 10 MG tablet Take 1 tablet (10 mg total) by mouth 3 (three) times daily. (Patient not taking: Reported on 07/08/2022) 30 each 0   cloNIDine (CATAPRES) 0.1 MG tablet TAKE 1 TABLET BY MOUTH DAILY (Patient not taking: Reported on 07/08/2022) 30 tablet 0   docusate sodium (COLACE) 100 MG capsule Take 100 mg by mouth 2 (two) times daily. (Patient not taking: Reported  on 07/08/2022)     fluticasone (FLONASE) 50 MCG/ACT nasal spray Place into both nostrils. (Patient not taking: Reported on 07/08/2022)     gabapentin (NEURONTIN) 100 MG capsule Take 2 capsules (200 mg total) by mouth 2 (two) times daily. (Patient not taking: Reported on 07/08/2022) 120 capsule 0   hydrOXYzine (VISTARIL) 50 MG capsule Take 1 capsule (50 mg total) by mouth 3 (three) times daily. (Patient not taking: Reported on 07/08/2022) 30 capsule 0   naltrexone (DEPADE) 50 MG tablet Take 50 mg by mouth daily. (Patient not taking: Reported on 07/08/2022)     pantoprazole (PROTONIX) 40 MG tablet Take 1 tablet (40 mg total) by mouth once daily. (Patient not taking: Reported on 07/08/2022) 30 tablet 0   prazosin (MINIPRESS) 1 MG capsule Take 1 mg by mouth at bedtime. (Patient not taking: Reported on 07/08/2022)     propranolol (INDERAL) 10 MG tablet TAKE 1 TABLET(10 MG) BY MOUTH THREE TIMES DAILY (Patient not taking: Reported on 07/08/2022) 90 tablet 0   QUEtiapine (SEROQUEL) 100 MG tablet TAKE 1 TABLET(100 MG) BY MOUTH AT BEDTIME (Patient not taking: Reported on 07/08/2022) 30 tablet 0   rOPINIRole (REQUIP) 0.5 MG tablet Take 0.5 mg by mouth at bedtime. (Patient not taking: Reported on 07/08/2022)     SUMAtriptan (IMITREX) 50 MG tablet Take 1 tablet (50 mg total) by mouth once daily for 1 dose. May repeat in 2 hours if headache persists or recurs. (Max of 2 doses in 24 hours). 10 tablet 0   venlafaxine (EFFEXOR) 37.5 MG tablet TAKE 1 TABLET(37.5 MG) BY MOUTH  TWICE DAILY (Patient not taking: Reported on 07/08/2022) 60 tablet 0    Lab Results:  Results for orders placed or performed during the hospital encounter of 07/08/22 (from the past 48 hour(s))  POC urine preg, ED (not at [facility])     Status: None   Collection Time: 07/08/22  1:50 PM  Result Value Ref Range   Preg Test, Ur NEGATIVE NEGATIVE    Comment:        THE SENSITIVITY OF THIS METHODOLOGY IS >24 mIU/mL   Urine Drug Screen, Qualitative     Status: None   Collection Time: 07/08/22  2:45 PM  Result Value Ref Range   Tricyclic, Ur Screen NONE DETECTED NONE DETECTED   Amphetamines, Ur Screen NONE DETECTED NONE DETECTED   MDMA (Ecstasy)Ur Screen NONE DETECTED NONE DETECTED   Cocaine Metabolite,Ur [place] NONE DETECTED NONE DETECTED   Opiate, Ur Screen NONE DETECTED NONE DETECTED   Phencyclidine (PCP) Ur S NONE DETECTED NONE DETECTED   Cannabinoid 50 Ng, Ur [place] NONE DETECTED NONE DETECTED   Barbiturates, Ur Screen NONE DETECTED NONE DETECTED   Benzodiazepine, Ur Scrn NONE DETECTED NONE DETECTED   Methadone Scn, Ur NONE DETECTED NONE DETECTED    Comment: (NOTE) Tricyclics + metabolites, urine    Cutoff 1000 ng/mL Amphetamines + metabolites, urine  Cutoff 1000 ng/mL MDMA (Ecstasy), urine              Cutoff 500 ng/mL Cocaine Metabolite, urine          Cutoff 300 ng/mL Opiate + metabolites, urine        Cutoff 300 ng/mL Phencyclidine (PCP), urine         Cutoff 25 ng/mL Cannabinoid, urine                 Cutoff 50 ng/mL Barbiturates + metabolites, urine  Cutoff 200 ng/mL Benzodiazepine, urine  Cutoff 200 ng/mL Methadone, urine                   Cutoff 300 ng/mL  The urine drug screen provides only a preliminary, unconfirmed analytical test result and should not be used for non-medical purposes. Clinical consideration and professional judgment should be applied to any positive drug screen result due to possible interfering substances. A more specific alternate chemical  method must be used in order to obtain a confirmed analytical result. Gas chromatography / mass spectrometry (GC/MS) is the preferred confirm atory method. Performed at [facility], [address] [place], [place] [address]   Comprehensive metabolic panel     Status: Abnormal   Collection Time: 07/08/22  2:47 PM  Result Value Ref Range   Sodium 135 135 - 145 mmol/L   Potassium 3.5 3.5 - 5.1 mmol/L   Chloride 103 98 - 111 mmol/L   CO2 20 (L) 22 - 32 mmol/L   Glucose, Bld 155 (H) 70 - 99 mg/dL    Comment: Glucose reference range applies only to samples taken after fasting for at least 8 hours.   BUN 8 6 - 20 mg/dL   Creatinine, Ser 0.68 0.44 - 1.00 mg/dL   Calcium 8.4 (L) 8.9 - 10.3 mg/dL   Total Protein 7.5 6.5 - 8.1 g/dL   Albumin 3.8 3.5 - 5.0 g/dL   AST 25 15 - 41 U/L   ALT 14 0 - 44 U/L   Alkaline Phosphatase 152 (H) 38 - 126 U/L   Total Bilirubin 0.2 (L) 0.3 - 1.2 mg/dL   GFR, Estimated >60 >60 mL/min    Comment: (NOTE) Calculated using the CKD-EPI Creatinine Equation (2021)    Anion gap 12 5 - 15    Comment: Performed at [facility], [address], [place], [place] [address]  Ethanol     Status: Abnormal   Collection Time: 07/08/22  2:47 PM  Result Value Ref Range   Alcohol, Ethyl (B) 222 (H) <10 mg/dL    Comment: (NOTE) Lowest detectable limit for serum alcohol is 10 mg/dL.  For medical purposes only. Performed at [facility], [address] [place], [place] [address]   cbc     Status: Abnormal   Collection Time: 07/08/22  2:47 PM  Result Value Ref Range   WBC 6.0 4.0 - 10.5 K/uL   RBC 4.42 3.87 - 5.11 MIL/uL   Hemoglobin 11.7 (L) 12.0 - 15.0 g/dL   HCT 35.8 (L) 36.0 - 46.0 %   MCV 81.0 80.0 - 100.0 fL   MCH 26.5 26.0 - 34.0 pg   MCHC 32.7 30.0 - 36.0 g/dL   RDW 18.2 (H) 11.5 - 15.5 %   Platelets 294 150 - 400 K/uL   nRBC 0.0 0.0 - 0.2 %    Comment: Performed at [facility], [address] [place],  [place] [address]  Magnesium     Status: None   Collection Time: 07/08/22  2:47 PM  Result Value Ref Range   Magnesium 2.1 1.7 - 2.4 mg/dL    Comment: Performed at [facility], [address] [place], [place] [address]    Blood Alcohol level:  Lab Results  Component Value Date   ETH 222 (H) 07/08/2022   ETH 234 (H) 06/10/2022    Physical Findings: AIMS:  , ,  ,  ,    CIWA:    COWS:     Musculoskeletal: Strength & Muscle Tone:  within normal limits Gait & Station: normal Patient leans: N/A  Psychiatric Specialty Exam:  Presentation  General Appearance: Casual  Eye Contact:Good  Speech:Clear and Coherent  Speech Volume:Normal  Handedness:Right   Mood and Affect  Mood:Euthymic  Affect:Congruent   Thought Process  Thought Processes:Coherent  Descriptions of Associations:Intact  Orientation:Full (Time, Place and Person)  Thought Content:WDL  History of Schizophrenia/Schizoaffective disorder:No  Duration of Psychotic Symptoms:No data recorded Hallucinations:Hallucinations: None  Ideas of Reference:None  Suicidal Thoughts:Suicidal Thoughts: No  Homicidal Thoughts:Homicidal Thoughts: No   Sensorium  Memory:Immediate Good  Judgment:Poor  Insight:Fair   Executive Functions  Concentration:Fair  Attention Span:Fair  [place]   Psychomotor Activity  Psychomotor Activity:Psychomotor Activity: Normal   Assets  Assets:Resilience; Social Support; Desire for Improvement; Housing   Sleep  Sleep:Sleep: Good    Physical Exam: Physical Exam ROS Blood pressure (!) 134/102, pulse 77, temperature 98.3 F (36.8 C), temperature source Oral, resp. rate 18, height 5\' 7"  (1.702 m), weight 72.6 kg, SpO2 97 %. Body mass index is 25.06 kg/m.  Treatment Plan Summary: Plan Patient does not meet criteria for inpatient psychiatric hospitalization. Discharge home with outpatient follow up. Reviewed with  EDP  Sherlon Handing, NP 07/09/2022, 11:01 AM

## 2022-07-18 ENCOUNTER — Emergency Department: Payer: Medicaid Other

## 2022-07-18 ENCOUNTER — Emergency Department
Admission: EM | Admit: 2022-07-18 | Discharge: 2022-07-20 | Disposition: A | Discharge status: Home / Self Care | Payer: Self-pay | Attending: Emergency Medicine | Admitting: Emergency Medicine

## 2022-07-18 ENCOUNTER — Encounter: Payer: Self-pay | Admitting: Emergency Medicine

## 2022-07-18 ENCOUNTER — Other Ambulatory Visit: Payer: Self-pay

## 2022-07-18 DIAGNOSIS — Z20822 Contact with and (suspected) exposure to covid-19: Secondary | ICD-10-CM | POA: Insufficient documentation

## 2022-07-18 DIAGNOSIS — F1090 Alcohol use, unspecified, uncomplicated: Secondary | ICD-10-CM | POA: Insufficient documentation

## 2022-07-18 DIAGNOSIS — Z789 Other specified health status: Secondary | ICD-10-CM | POA: Diagnosis present

## 2022-07-18 DIAGNOSIS — F1994 Other psychoactive substance use, unspecified with psychoactive substance-induced mood disorder: Secondary | ICD-10-CM | POA: Diagnosis present

## 2022-07-18 DIAGNOSIS — F10920 Alcohol use, unspecified with intoxication, uncomplicated: Secondary | ICD-10-CM

## 2022-07-18 DIAGNOSIS — F609 Personality disorder, unspecified: Secondary | ICD-10-CM | POA: Insufficient documentation

## 2022-07-18 DIAGNOSIS — F10129 Alcohol abuse with intoxication, unspecified: Secondary | ICD-10-CM | POA: Insufficient documentation

## 2022-07-18 DIAGNOSIS — F1914 Other psychoactive substance abuse with psychoactive substance-induced mood disorder: Secondary | ICD-10-CM | POA: Insufficient documentation

## 2022-07-18 DIAGNOSIS — R45851 Suicidal ideations: Secondary | ICD-10-CM | POA: Insufficient documentation

## 2022-07-18 DIAGNOSIS — F17200 Nicotine dependence, unspecified, uncomplicated: Secondary | ICD-10-CM | POA: Insufficient documentation

## 2022-07-18 DIAGNOSIS — F319 Bipolar disorder, unspecified: Secondary | ICD-10-CM | POA: Insufficient documentation

## 2022-07-18 DIAGNOSIS — F1010 Alcohol abuse, uncomplicated: Secondary | ICD-10-CM | POA: Insufficient documentation

## 2022-07-18 DIAGNOSIS — R4182 Altered mental status, unspecified: Secondary | ICD-10-CM | POA: Insufficient documentation

## 2022-07-18 DIAGNOSIS — Y908 Blood alcohol level of 240 mg/100 ml or more: Secondary | ICD-10-CM | POA: Insufficient documentation

## 2022-07-18 LAB — CBC
HCT: 35 % — ABNORMAL LOW (ref 36.0–46.0)
Hemoglobin: 11.4 g/dL — ABNORMAL LOW (ref 12.0–15.0)
MCH: 26.5 pg (ref 26.0–34.0)
MCHC: 32.6 g/dL (ref 30.0–36.0)
MCV: 81.4 fL (ref 80.0–100.0)
Platelets: 280 10*3/uL (ref 150–400)
RBC: 4.3 MIL/uL (ref 3.87–5.11)
RDW: 19.2 % — ABNORMAL HIGH (ref 11.5–15.5)
WBC: 5.5 10*3/uL (ref 4.0–10.5)
nRBC: 0 % (ref 0.0–0.2)

## 2022-07-18 LAB — LIPASE, BLOOD: Lipase: 110 U/L — ABNORMAL HIGH (ref 11–51)

## 2022-07-18 LAB — COMPREHENSIVE METABOLIC PANEL
ALT: 11 U/L (ref 0–44)
AST: 19 U/L (ref 15–41)
Albumin: 3.5 g/dL (ref 3.5–5.0)
Alkaline Phosphatase: 119 U/L (ref 38–126)
Anion gap: 9 (ref 5–15)
BUN: 10 mg/dL (ref 6–20)
CO2: 22 mmol/L (ref 22–32)
Calcium: 8.1 mg/dL — ABNORMAL LOW (ref 8.9–10.3)
Chloride: 101 mmol/L (ref 98–111)
Creatinine, Ser: 0.57 mg/dL (ref 0.44–1.00)
GFR, Estimated: >60 mL/min (ref >60)
Glucose, Bld: 95 mg/dL (ref 70–99)
Potassium: 4.1 mmol/L (ref 3.5–5.1)
Sodium: 132 mmol/L — ABNORMAL LOW (ref 135–145)
Total Bilirubin: 0.5 mg/dL (ref 0.3–1.2)
Total Protein: 7 g/dL (ref 6.5–8.1)

## 2022-07-18 LAB — RESP PANEL BY RT-PCR (FLU A&B, COVID) ARPGX2
Influenza A by PCR: NEGATIVE
Influenza B by PCR: NEGATIVE
SARS Coronavirus 2 by RT PCR: NEGATIVE

## 2022-07-18 LAB — ETHANOL: Alcohol, Ethyl (B): 431 mg/dL (ref <10)

## 2022-07-18 MED ORDER — PRAZOSIN HCL 1 MG PO CAPS
1.0000 mg | ORAL_CAPSULE | Freq: Every day | ORAL | Status: DC
Start: 2022-07-18 — End: 2022-07-20
  Administered 2022-07-18 – 2022-07-19 (×2): 1 mg via ORAL
  Filled 2022-07-18 (×2): qty 1

## 2022-07-18 MED ORDER — NICOTINE 21 MG/24HR TD PT24
21.0000 mg | MEDICATED_PATCH | Freq: Every day | TRANSDERMAL | Status: DC
  Administered 2022-07-18 – 2022-07-19 (×2): 21 mg via TRANSDERMAL
  Filled 2022-07-18 (×2): qty 1

## 2022-07-18 NOTE — ED Notes (Signed)
Psych np speaking with patient.

## 2022-07-18 NOTE — ED Notes (Signed)
Patient to CT via wheelchair.
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
## 2022-07-18 NOTE — ED Notes (Signed)
Patient incontinent large amount of urine.  Patient cleansed and linen changed.

## 2022-07-18 NOTE — BH Assessment (Signed)
Comprehensive Clinical Assessment (CCA) Note  07/18/2022 Katrina Turner [id number]  Chief Complaint: Patient is a 41 year old female presenting to [facility] ED voluntarily. Per triage note Pt to ED via POV, states she is SI with a plan to cut her carotid. Pt states she is "fucking up" and that she is "an alcholic bitch who needs rehab". Pt endorses ETOH use, states she has drank approx 1 case of beer today. 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 During assessment patient appears alert and oriented x4, calm and cooperative, patient appears to be under the influence of substances. Patient reports why she is presenting to the ED "because I'm a drunk bitch." Patient reports drinking "2 cases of beer today, I can't fix what's wrong in [place]." Patient has a long history of alcohol abuse and has never followed up with outpatient treatment, she reports "I was supposed to be going to [facility] but they never called me back." This writer tried to encourage the patient that she will probably have to follow-up on her own but the patient did not seem too interested in that. Patient reports that she has been drinking since the age of 38 and cannot recall her longest sobriety. Patient reports SI with a plan "to slit my carotid" she also reports AH "I hear conversations."    Chief Complaint  Patient presents with   Alcohol Intoxication   Visit Diagnosis: Alcohol induced mood disorder    CCA Screening, Triage and Referral (STR)  Patient Reported Information How did you hear about [place]? Self  Referral name: No data recorded Referral phone number: No data recorded  Whom do you see for routine medical problems? No data recorded Practice/Facility Name: No data recorded Practice/Facility Phone Number: No data recorded Name of Contact: No data recorded Contact Number: No data  recorded Contact Fax Number: No data recorded Prescriber Name: No data recorded Prescriber Address (if known): No data recorded  What Is the Reason for Your Visit/Call Today? Pt to ED via POV, states she is SI with a plan to cut her carotid. Pt states she is "fucking up" and that she is "an alcholic bitch who needs rehab". Pt endorses ETOH use, states she has drank approx 1 case of beer today. 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  How Long Has This Been Causing You Problems? > than 6 months  What Do You Feel Would Help You the Most Today? Alcohol or Drug Use Treatment; Treatment for Depression or other mood problem   Have You Recently Been in Any Inpatient Treatment (Hospital/Detox/Crisis Center/28-Day Program)? No data recorded Name/Location of Program/Hospital:No data recorded How Long Were You There? No data recorded When Were You Discharged? No data recorded  Have You Ever Received Services From [facility] Before? No data recorded Who Do You See at [facility]? No data recorded  Have You Recently Had Any Thoughts About Hurting Yourself? Yes  Are You Planning to Commit Suicide/Harm Yourself At This time? No   Have you Recently Had Thoughts About Hurting Someone Katrina Turner? No  Explanation: No data recorded  Have You Used Any Alcohol or Drugs in the Past 24 Hours? Yes  How Long Ago Did You Use Drugs or Alcohol? No  data recorded What Did You Use and How Much? Alcohol "beer, 1 case every day"   Do You Currently Have a Therapist/Psychiatrist? No  Name of Therapist/Psychiatrist: No data recorded  Have You Been Recently Discharged From Any Office Practice or Programs? No  Explanation of Discharge From Practice/Program: No data recorded    CCA Screening Triage Referral Assessment Type of Contact: Face-to-Face  Is this Initial or Reassessment? No data  recorded Date Telepsych consult ordered in [facility]:  No data recorded Time Telepsych consult ordered in [facility]:  No data recorded  Patient Reported Information Reviewed? No data recorded Patient Left Without Being Seen? No data recorded Reason for Not Completing Assessment: No data recorded  Collateral Involvement: Husband   Does Patient Have a Court Appointed Legal Guardian? No data recorded Name and Contact of Legal Guardian: No data recorded If Minor and Not Living with Parent(s), Who has Custody? No data recorded Is CPS involved or ever been involved? Never  Is APS involved or ever been involved? Never   Patient Determined To Be At Risk for Harm To Self or Others Based on Review of Patient Reported Information or Presenting Complaint? Yes, for Self-Harm  Method: No data recorded Availability of Means: No data recorded Intent: No data recorded Notification Required: No data recorded Additional Information for Danger to Others Potential: No data recorded Additional Comments for Danger to Others Potential: No data recorded Are There Guns or Other Weapons in Your Home? No data recorded Types of Guns/Weapons: No data recorded Are These Weapons Safely Secured?                            No data recorded Who Could Verify You Are Able To Have These Secured: No data recorded Do You Have any Outstanding Charges, Pending Court Dates, Parole/Probation? No data recorded Contacted To Inform of Risk of Harm To Self or Others: No data recorded  Location of Assessment: [facility] ED   Does Patient Present under Involuntary Commitment? No  IVC Papers Initial File Date: No data recorded  [place] of Residence: [place]   Patient Currently Receiving the Following Services: Not Receiving Services   Determination of Need: Emergent (2 hours)   Options For Referral: ED Visit     CCA Biopsychosocial Intake/Chief Complaint:  No data recorded Current Symptoms/Problems: No data recorded  Patient  Reported Schizophrenia/Schizoaffective Diagnosis in Past: No   Strengths: Patient is able to communicate  Preferences: No data recorded Abilities: No data recorded  Type of Services Patient Feels are Needed: No data recorded  Initial Clinical Notes/Concerns: No data recorded  Mental Health Symptoms Depression:   Change in energy/activity; Difficulty Concentrating; Hopelessness; Irritability; Worthlessness   Duration of Depressive symptoms:  Greater than two weeks   Mania:   None   Anxiety:    Restlessness; Tension; Worrying; Difficulty concentrating   Psychosis:   Hallucinations   Duration of Psychotic symptoms:  Greater than six months   Trauma:   None   Obsessions:   None   Compulsions:   None   Inattention:   None   Hyperactivity/Impulsivity:   None   Oppositional/Defiant Behaviors:   None   Emotional Irregularity:   Recurrent suicidal behaviors/gestures/threats; Intense/unstable relationships; Potentially harmful impulsivity; Chronic feelings of emptiness   Other Mood/Personality Symptoms:  No data recorded   Mental Status Exam Appearance and self-care  Stature:   Average   Weight:   Average weight   Clothing:  Casual   Grooming:   Normal   Cosmetic use:   None   Posture/gait:   Normal   Motor activity:   Not Remarkable   Sensorium  Attention:   Normal   Concentration:   Normal   Orientation:   X5   Recall/memory:   Normal   Affect and Mood  Affect:   Depressed   Mood:   Depressed; Anxious; Hopeless   Relating  Eye contact:   Normal   Facial expression:   Responsive   Attitude toward examiner:   Cooperative   Thought and Language  Speech flow:  Clear and Coherent   Thought content:   Appropriate to Mood and Circumstances   Preoccupation:   None   Hallucinations:   None   Organization:  No data recorded  [employer] of Knowledge:   Fair   Intelligence:   Average    Abstraction:   Normal   Judgement:   Impaired   Reality Testing:   Adequate   Insight:   Lacking; Poor   Decision Making:   Impulsive   Social Functioning  Social Maturity:   Irresponsible   Social Judgement:   Heedless   Stress  Stressors:   Family conflict; Financial   Coping Ability:   Exhausted   Skill Deficits:   None   Supports:   Family     Religion: Religion/Spirituality Are You A Religious Person?: No  Leisure/Recreation: Leisure / Recreation Do You Have Hobbies?: No  Exercise/Diet: Exercise/Diet Do You Exercise?: No Have You Gained or Lost A Significant Amount of Weight in the Past Six Months?: No Do You Follow a Special Diet?: No Do You Have Any Trouble Sleeping?: No   CCA Employment/Education Employment/Work Situation: Employment / Work Situation Employment Situation: Unemployed Patient's Job has Been Impacted by Current Illness: No Has Patient ever Been in Equities trader?: No  Education: Education Is Patient Currently Attending School?: No Did Theme park manager?: No Did You Have An Individualized Education Program (IIEP): No Did You Have Any Difficulty At [employer]?: No Patient's Education Has Been Impacted by Current Illness: No   CCA Family/Childhood History Family and Relationship History: Family history Marital status: Married Number of Years Married:  (Unknown) What types of issues is patient dealing with in the relationship?: Unknown Additional relationship information: None reported Does patient have children?:  (Unknown)  Childhood History:  Childhood History Did patient suffer any verbal/emotional/physical/sexual abuse as a child?: No Did patient suffer from severe childhood neglect?: No Has patient ever been sexually abused/assaulted/raped as an adolescent or adult?: No Was the patient ever a victim of a crime or a disaster?: No Witnessed domestic violence?: No Has patient been affected by domestic violence as  an adult?: No  Child/Adolescent Assessment:     CCA Substance Use Alcohol/Drug Use: Alcohol / Drug Use Pain Medications: See MAR Prescriptions: See MAR Over the Counter: See MAR History of alcohol / drug use?: Yes Longest period of sobriety (when/how long): Patient does not recall her longest period of sobriety Negative Consequences of Use: Financial, Personal relationships Withdrawal Symptoms: Agitation, Blackouts, Change in blood pressure, Irritability Substance #1 Name of Substance 1: Alcohol 1 - Age of First Use: "teenager" 1 - Amount (size/oz): 1 case of beer 1 - Frequency: daily 1 - Last Use / Amount: 07/18/22 1- Route of Use: Oral                       ASAM's:  Six Dimensions of Multidimensional Assessment  Dimension 1:  Acute Intoxication and/or Withdrawal Potential:      Dimension 2:  Biomedical Conditions and Complications:      Dimension 3:  Emotional, Behavioral, or Cognitive Conditions and Complications:     Dimension 4:  Readiness to Change:     Dimension 5:  Relapse, Continued use, or Continued Problem Potential:     Dimension 6:  Recovery/Living Environment:     ASAM Severity Score:    ASAM Recommended Level of Treatment: ASAM Recommended Level of Treatment: Level III Residential Treatment   Substance use Disorder (SUD) Substance Use Disorder (SUD)  Checklist Symptoms of Substance Use: Continued use despite having a persistent/recurrent physical/psychological problem caused/exacerbated by use, Evidence of tolerance, Evidence of withdrawal (Comment), Continued use despite persistent or recurrent social, interpersonal problems, caused or exacerbated by use, Large amounts of time spent to obtain, use or recover from the substance(s), Persistent desire or unsuccessful efforts to cut down or control use, Presence of craving or strong urge to use, Social, occupational, recreational activities given up or reduced due to use, Recurrent use that results in a  failure to fulfill major role obligations (work, school, home), Substance(s) often taken in larger amounts or over longer times than was intended  Recommendations for Services/Supports/Treatments: Recommendations for Services/Supports/Treatments Recommendations For Services/Supports/Treatments: IOP (Intensive Outpatient Program), Detox  DSM5 Diagnoses: Patient Active Problem List   Diagnosis Date Noted   Insomnia 09/26/2021   Neuropathy 09/26/2021   Severe recurrent major depression without psychotic features (HCC) 09/24/2021   Substance induced mood disorder (HCC) 09/22/2021   Polysubstance abuse (HCC)    Encounter to establish care 09/05/2021   History of anxiety 09/05/2021   Alcoholic intoxication without complication (HCC) 02/16/2021   GI bleed 10/16/2020   Essential hypertension 10/16/2020   Low back pain radiating to both legs 08/08/2020   Headache disorder 07/19/2020   Numbness and tingling of both feet 07/19/2020   Low hemoglobin 07/11/2020   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   Hot flashes 06/05/2020   Full dentures 06/05/2020   Chronic pancreatitis (HCC) 06/05/2020   Chronic migraine without aura without status migrainosus, not intractable 06/05/2020   Seasonal allergies 06/05/2020   High risk medication use 06/05/2020   Personality disorder (HCC) 06/05/2020   Urinary symptom or sign 06/05/2020   Skin lesion 06/05/2020   Alcohol abuse 06/27/2019   Bipolar 1 disorder (HCC) 05/24/2019   H/O gastric bypass 05/24/2019   Acute pancreatitis 05/24/2019   Alcohol use 05/24/2019   Tobacco use disorder 03/04/2019   Abdominal pain 03/03/2019   Lupus (HCC) 03/03/2019   Raynaud's phenomenon without gangrene 03/03/2019    Patient Centered Plan: Patient is on the following Treatment Plan(s):  Depression and Substance Abuse   Referrals to Alternative Service(s): Referred to Alternative Service(s):   Place:   Date:   Time:    Referred to Alternative Service(s):    Place:   Date:   Time:    Referred to Alternative Service(s):   Place:   Date:   Time:    Referred to Alternative Service(s):   Place:   Date:   Time:      @[facility]COLLABOFCARE @  , LCAS-A

## 2022-07-18 NOTE — ED Triage Notes (Addendum)
Pt to ED via POV, states she is SI with a plan to cut her carotid. Pt states she is "fucking up" and that she is "an alcholic bitch who needs rehab". Pt endorses ETOH use, states she has drank approx 1 case of beer today. 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

## 2022-07-19 DIAGNOSIS — F319 Bipolar disorder, unspecified: Secondary | ICD-10-CM

## 2022-07-19 DIAGNOSIS — F1994 Other psychoactive substance use, unspecified with psychoactive substance-induced mood disorder: Secondary | ICD-10-CM

## 2022-07-19 DIAGNOSIS — F609 Personality disorder, unspecified: Secondary | ICD-10-CM

## 2022-07-19 DIAGNOSIS — F10920 Alcohol use, unspecified with intoxication, uncomplicated: Secondary | ICD-10-CM

## 2022-07-19 LAB — URINE DRUG SCREEN, QUALITATIVE (ARMC ONLY)
Amphetamines, Ur Screen: NOT DETECTED
Barbiturates, Ur Screen: NOT DETECTED
Benzodiazepine, Ur Scrn: NOT DETECTED
Cannabinoid 50 Ng, Ur ~~LOC~~: NOT DETECTED
Cocaine Metabolite,Ur ~~LOC~~: NOT DETECTED
MDMA (Ecstasy)Ur Screen: NOT DETECTED
Methadone Scn, Ur: NOT DETECTED
Opiate, Ur Screen: NOT DETECTED
Phencyclidine (PCP) Ur S: NOT DETECTED
Tricyclic, Ur Screen: NOT DETECTED

## 2022-07-19 LAB — POC URINE PREG, ED: Preg Test, Ur: NEGATIVE

## 2022-07-19 MED ORDER — ACETAMINOPHEN 500 MG PO TABS
1000.0000 mg | ORAL_TABLET | Freq: Once | ORAL | Status: AC
  Administered 2022-07-19: 1000 mg via ORAL
  Filled 2022-07-19: qty 2

## 2022-07-19 MED ORDER — HYDROXYZINE HCL 25 MG PO TABS
50.0000 mg | ORAL_TABLET | Freq: Once | ORAL | Status: AC
  Administered 2022-07-19: 50 mg via ORAL
  Filled 2022-07-19: qty 2

## 2022-07-19 NOTE — ED Notes (Signed)
TTS and Psych NP speaking with patient. 

## 2022-07-19 NOTE — ED Notes (Signed)
Visitor in lobby to see pt; pt willing to have visitor; visitor notified they have 10 minutes. Security and NT notified.

## 2022-07-19 NOTE — ED Notes (Signed)
Patient incontinent of large amount of urine.  Patient to restroom to clean up and change into dry scrubs.  Bed linens changed.

## 2022-07-19 NOTE — ED Notes (Signed)
PT given breakfast tray  

## 2022-07-19 NOTE — ED Notes (Signed)
Hospital meal provided, pt tolerated w/o complaints.  Waste discarded appropriately.  

## 2022-07-19 NOTE — ED Notes (Signed)
VOL/pending reassessment in the AM 

## 2022-07-19 NOTE — ED Notes (Addendum)
41 yof lying supine in the bed. The pt is warm, pink, and dry. The pt was sleeping with her walker at her bedside.

## 2022-07-19 NOTE — ED Provider Notes (Signed)
-----------------------------------------   10:50 PM on 07/19/2022 ----------------------------------------- Patient has been seen and evaluated by psychiatry.  They believe the patient safe for discharge home from psychiatric standpoint.  Patient's medical work-up is been largely nonrevealing.  Patient no longer wishes to go to detox/rehab.  We will discharge the patient    Minna Antis, MD 07/19/22 2250

## 2022-07-19 NOTE — Discharge Instructions (Addendum)
You have been seen in the emergency department for a  psychiatric concern. You have been evaluated both medically as well as psychiatrically. Please follow-up with your outpatient resources provided. Return to the emergency department for any worsening symptoms, or any thoughts of hurting yourself or anyone else so that we may attempt to help you. 
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
## 2022-07-20 NOTE — ED Notes (Signed)
Patient has been seen and evaluated by psychiatry /patient safe for discharge to home /patient no longer wishes to go to detox/ rehab pt is vol will be discharged this am when she has a ride

## 2022-07-20 NOTE — ED Notes (Signed)
E-signature not working at this time. Pt verbalized understanding of D/C instructions, prescriptions and follow up care with no further questions at this time. Pt in NAD and ambulatory at time of D/C.  

## 2022-07-20 NOTE — ED Notes (Signed)
Belongings bag 1/1 returned to patient at time of departure 

## 2022-07-29 ENCOUNTER — Ambulatory Visit: Payer: Medicaid Other | Admitting: Gerontology

## 2022-07-29 ENCOUNTER — Emergency Department: Payer: Self-pay

## 2022-07-29 ENCOUNTER — Emergency Department: Payer: Medicaid Other

## 2022-07-29 ENCOUNTER — Encounter: Payer: Self-pay | Admitting: Emergency Medicine

## 2022-07-29 ENCOUNTER — Other Ambulatory Visit: Payer: Self-pay

## 2022-07-29 ENCOUNTER — Emergency Department
Admission: EM | Admit: 2022-07-29 | Discharge: 2022-07-30 | Disposition: A | Discharge status: Home / Self Care | Payer: Self-pay | Attending: Emergency Medicine | Admitting: Emergency Medicine

## 2022-07-29 VITALS — BP 139/98 | HR 76 | Temp 97.8°F

## 2022-07-29 DIAGNOSIS — R748 Abnormal levels of other serum enzymes: Secondary | ICD-10-CM | POA: Insufficient documentation

## 2022-07-29 DIAGNOSIS — M79605 Pain in left leg: Secondary | ICD-10-CM | POA: Insufficient documentation

## 2022-07-29 DIAGNOSIS — T7621XA Adult sexual abuse, suspected, initial encounter: Secondary | ICD-10-CM | POA: Insufficient documentation

## 2022-07-29 DIAGNOSIS — H5711 Ocular pain, right eye: Secondary | ICD-10-CM | POA: Insufficient documentation

## 2022-07-29 DIAGNOSIS — R0789 Other chest pain: Secondary | ICD-10-CM | POA: Insufficient documentation

## 2022-07-29 DIAGNOSIS — F1010 Alcohol abuse, uncomplicated: Secondary | ICD-10-CM | POA: Insufficient documentation

## 2022-07-29 DIAGNOSIS — Y908 Blood alcohol level of 240 mg/100 ml or more: Secondary | ICD-10-CM | POA: Insufficient documentation

## 2022-07-29 DIAGNOSIS — F10920 Alcohol use, unspecified with intoxication, uncomplicated: Secondary | ICD-10-CM

## 2022-07-29 LAB — SALICYLATE LEVEL: Salicylate Lvl: <7 mg/dL — ABNORMAL LOW (ref 7.0–30.0)

## 2022-07-29 LAB — COMPREHENSIVE METABOLIC PANEL
ALT: 13 U/L (ref 0–44)
AST: 29 U/L (ref 15–41)
Albumin: 3.5 g/dL (ref 3.5–5.0)
Alkaline Phosphatase: 154 U/L — ABNORMAL HIGH (ref 38–126)
Anion gap: 7 (ref 5–15)
BUN: 7 mg/dL (ref 6–20)
CO2: 26 mmol/L (ref 22–32)
Calcium: 7.9 mg/dL — ABNORMAL LOW (ref 8.9–10.3)
Chloride: 108 mmol/L (ref 98–111)
Creatinine, Ser: 0.54 mg/dL (ref 0.44–1.00)
GFR, Estimated: >60 mL/min (ref >60)
Glucose, Bld: 87 mg/dL (ref 70–99)
Potassium: 4.4 mmol/L (ref 3.5–5.1)
Sodium: 141 mmol/L (ref 135–145)
Total Bilirubin: 0.4 mg/dL (ref 0.3–1.2)
Total Protein: 7.1 g/dL (ref 6.5–8.1)

## 2022-07-29 LAB — CBC WITH DIFFERENTIAL/PLATELET
Abs Immature Granulocytes: 0.01 10*3/uL (ref 0.00–0.07)
Basophils Absolute: 0.1 10*3/uL (ref 0.0–0.1)
Basophils Relative: 1 %
Eosinophils Absolute: 0.2 10*3/uL (ref 0.0–0.5)
Eosinophils Relative: 3 %
HCT: 35.6 % — ABNORMAL LOW (ref 36.0–46.0)
Hemoglobin: 11.7 g/dL — ABNORMAL LOW (ref 12.0–15.0)
Immature Granulocytes: 0 %
Lymphocytes Relative: 41 %
Lymphs Abs: 2.3 10*3/uL (ref 0.7–4.0)
MCH: 27 pg (ref 26.0–34.0)
MCHC: 32.9 g/dL (ref 30.0–36.0)
MCV: 82.2 fL (ref 80.0–100.0)
Monocytes Absolute: 0.4 10*3/uL (ref 0.1–1.0)
Monocytes Relative: 8 %
Neutro Abs: 2.7 10*3/uL (ref 1.7–7.7)
Neutrophils Relative %: 47 %
Platelets: 193 10*3/uL (ref 150–400)
RBC: 4.33 MIL/uL (ref 3.87–5.11)
RDW: 19.9 % — ABNORMAL HIGH (ref 11.5–15.5)
WBC: 5.6 10*3/uL (ref 4.0–10.5)
nRBC: 0 % (ref 0.0–0.2)

## 2022-07-29 LAB — URINALYSIS, ROUTINE W REFLEX MICROSCOPIC
Bilirubin Urine: NEGATIVE
Glucose, UA: NEGATIVE mg/dL
Ketones, ur: NEGATIVE mg/dL
Nitrite: POSITIVE — AB
Protein, ur: 30 mg/dL — AB
Specific Gravity, Urine: 1.005 (ref 1.005–1.030)
WBC, UA: >50 WBC/hpf — ABNORMAL HIGH (ref 0–5)
pH: 5 (ref 5.0–8.0)

## 2022-07-29 LAB — ETHANOL: Alcohol, Ethyl (B): 370 mg/dL (ref <10)

## 2022-07-29 LAB — ACETAMINOPHEN LEVEL: Acetaminophen (Tylenol), Serum: <10 ug/mL — ABNORMAL LOW (ref 10–30)

## 2022-07-29 LAB — TROPONIN I (HIGH SENSITIVITY)
Troponin I (High Sensitivity): 3 ng/L (ref <18)
Troponin I (High Sensitivity): 3 ng/L (ref <18)

## 2022-07-29 MED ORDER — FOSFOMYCIN TROMETHAMINE 3 G PO PACK
3.0000 g | PACK | Freq: Once | ORAL | Status: AC
  Administered 2022-07-29: 3 g via ORAL
  Filled 2022-07-29: qty 3

## 2022-07-29 MED ORDER — LIDOCAINE 5 % EX PTCH
1.0000 | MEDICATED_PATCH | Freq: Once | CUTANEOUS | Status: DC
  Administered 2022-07-29: 1 via TRANSDERMAL
  Filled 2022-07-29: qty 1
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
## 2022-07-29 NOTE — Patient Instructions (Signed)
Alcohol Intoxication Alcohol intoxication happens when you cannot think clearly or function well after drinking alcohol. This can happen after just one drink. The effect that alcohol has on how you think and function depends on: How much alcohol you drank. Your age, your weight, and whether you are a man or a woman. How often you drink alcohol. If you have other medical problems. Alcohol intoxication can range from mild to severe. It can be dangerous, especially if: You drink a large amount of alcohol in a short time (binge drink). You drink alcohol and take drugs or medicines. What are the causes? This condition is caused by drinking alcohol. What increases the risk? Peer pressure in young adults. Difficulty managing stress. History of drug or alcohol abuse. Drinking alcohol and taking drugs. Family history of drug or alcohol abuse. Low body weight. Binge drinking. What are the signs or symptoms? Feeling relaxed or sleepy. Having mild difficulty with coordination, speech, memory, or attention. Strong anger or extreme sadness. Severe difficulty with coordination, speech, memory, or attention. Other symptoms may include: Passing out. Vomiting. Confusion. Slow breathing. Coma. How is this treated? This condition may be treated with: Close monitoring in the hospital. IV fluids to add water in your body. Medicine to treat vomiting or to get rid of alcohol in the body. Counseling about the dangers of alcohol. Oxygen therapy or a breathing machine (ventilator). You may also be treated for substance use disorder. Follow these instructions at home: Eating and drinking  Do not drink alcohol if: Your doctor tells you not to drink. You are pregnant, may be pregnant, or are planning to get pregnant. You are under the legal drinking age (42 years old in the [place] You are taking medicines that you should not take with alcohol. Alcohol causes your medical problem to get worse. You  have to drive or do activities that need you to be alert. You have substance use disorder. This is when using alcohol again and again causes problems with your health, your relationships, or with what you need to do at work, home, or school. Ask your doctor if alcohol is safe for you. If you are allowed to drink, limit how much you have to: 0-1 drink a day for women who are not pregnant. 0-2 drinks a day for men. Know how much alcohol is in your drink. In the [place] one drink equals one 12 oz bottle of beer (355 mL), one 5 oz glass of wine (148 mL), or one 1 oz glass of hard liquor (44 mL). Be sure to eat before you drink alcohol. Alcohol increases peeing. It is important to stay hydrated and avoid caffeine. Caffeine may be in coffee, tea, and some sodas. Caffeine can make you thirsty. Do not drink more than one drink in an hour. If you are having more than one drink, have a drink without alcohol (such as water) between your drinks. General instructions  Take over-the-counter and prescription medicines only as told by your doctor. Do not drive after drinking any amount of alcohol. Plan for a designated driver or another way to go home. Have someone you trust stay with you while you are intoxicated. Youshould not be left alone. Contact a doctor if: You do not feel better after a few days. You have problems at work, at school, or at home due to drinking. You have trouble with any of the following: Movement. Talking. Memory. Paying attention to things. Get help right away if: You have trouble staying awake. You  are light-headed or faint. You feel very confused. You have trouble staying awake. You are vomiting blood. The blood may be bright red or look like coffee grounds. You have been told that you have had jerky movements that you cannot control (seizure). You have blood in your poop (stool). The blood may: Be bright red. Make your poop black and tarry and make it smell bad. These  symptoms may be an emergency. Get help right away. Call 911. Do not wait to see if the symptoms will go away. Do not drive yourself to the hospital. Also, get help right away if: You have thoughts about hurting yourself or others. Take one of these steps if you feel like you may hurt yourself or others, or have thoughts about taking your own life: Call 911. Call the National Suicide Prevention Lifeline at 732-301-1986 or 988. This is open 24 hours a day. Text the Crisis Text Line at 702-057-0059. Summary Alcohol intoxication happens when you cannot think clearly or function well after drinking alcohol. This can happen after just one drink. If your doctor says that alcohol is safe for you, limit how much you drink. Contact a doctor if you have problems at work, at school, or at home due to drinking. Get help right away if you have thoughts about hurting yourself or others. This information is not intended to replace advice given to you by your health care provider. Make sure you discuss any questions you have with your health care provider. Document Revised: 02/10/2022 Document Reviewed: 02/10/2022 Elsevier Patient Education  2023 [employer].
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
## 2022-07-30 LAB — URINE DRUG SCREEN, QUALITATIVE (ARMC ONLY)
Amphetamines, Ur Screen: NOT DETECTED
Barbiturates, Ur Screen: NOT DETECTED
Benzodiazepine, Ur Scrn: NOT DETECTED
Cannabinoid 50 Ng, Ur ~~LOC~~: NOT DETECTED
Cocaine Metabolite,Ur ~~LOC~~: POSITIVE — AB
MDMA (Ecstasy)Ur Screen: NOT DETECTED
Methadone Scn, Ur: NOT DETECTED
Opiate, Ur Screen: NOT DETECTED
Phencyclidine (PCP) Ur S: NOT DETECTED
Tricyclic, Ur Screen: NOT DETECTED
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
## 2022-08-01 LAB — URINE CULTURE: Culture: >=100000 — AB

## 2022-08-13 ENCOUNTER — Emergency Department
Admission: EM | Admit: 2022-08-13 | Discharge: 2022-08-13 | Discharge status: Against Medical Advice | Payer: Medicaid Other | Attending: Emergency Medicine | Admitting: Emergency Medicine

## 2022-08-13 ENCOUNTER — Encounter: Payer: Self-pay | Admitting: Emergency Medicine

## 2022-08-13 ENCOUNTER — Other Ambulatory Visit: Payer: Self-pay

## 2022-08-13 DIAGNOSIS — F1010 Alcohol abuse, uncomplicated: Secondary | ICD-10-CM | POA: Insufficient documentation

## 2022-08-13 DIAGNOSIS — R300 Dysuria: Secondary | ICD-10-CM | POA: Insufficient documentation

## 2022-08-13 DIAGNOSIS — Y908 Blood alcohol level of 240 mg/100 ml or more: Secondary | ICD-10-CM | POA: Insufficient documentation

## 2022-08-13 DIAGNOSIS — R109 Unspecified abdominal pain: Secondary | ICD-10-CM | POA: Insufficient documentation

## 2022-08-13 DIAGNOSIS — Z5321 Procedure and treatment not carried out due to patient leaving prior to being seen by health care provider: Secondary | ICD-10-CM | POA: Insufficient documentation

## 2022-08-13 LAB — COMPREHENSIVE METABOLIC PANEL
ALT: 16 U/L (ref 0–44)
AST: 26 U/L (ref 15–41)
Albumin: 2.8 g/dL — ABNORMAL LOW (ref 3.5–5.0)
Alkaline Phosphatase: 128 U/L — ABNORMAL HIGH (ref 38–126)
Anion gap: 9 (ref 5–15)
BUN: <5 mg/dL — ABNORMAL LOW (ref 6–20)
CO2: 25 mmol/L (ref 22–32)
Calcium: 8.4 mg/dL — ABNORMAL LOW (ref 8.9–10.3)
Chloride: 102 mmol/L (ref 98–111)
Creatinine, Ser: 0.44 mg/dL (ref 0.44–1.00)
GFR, Estimated: >60 mL/min (ref >60)
Glucose, Bld: 94 mg/dL (ref 70–99)
Potassium: 3.1 mmol/L — ABNORMAL LOW (ref 3.5–5.1)
Sodium: 136 mmol/L (ref 135–145)
Total Bilirubin: 0.6 mg/dL (ref 0.3–1.2)
Total Protein: 6.8 g/dL (ref 6.5–8.1)

## 2022-08-13 LAB — URINALYSIS, ROUTINE W REFLEX MICROSCOPIC
Bacteria, UA: NONE SEEN
Bilirubin Urine: NEGATIVE
Glucose, UA: NEGATIVE mg/dL
Hgb urine dipstick: NEGATIVE
Ketones, ur: NEGATIVE mg/dL
Nitrite: NEGATIVE
Protein, ur: NEGATIVE mg/dL
Specific Gravity, Urine: 1.006 (ref 1.005–1.030)
pH: 6 (ref 5.0–8.0)

## 2022-08-13 LAB — LIPASE, BLOOD: Lipase: 105 U/L — ABNORMAL HIGH (ref 11–51)

## 2022-08-13 LAB — CBC
HCT: 32.3 % — ABNORMAL LOW (ref 36.0–46.0)
Hemoglobin: 10.4 g/dL — ABNORMAL LOW (ref 12.0–15.0)
MCH: 28 pg (ref 26.0–34.0)
MCHC: 32.2 g/dL (ref 30.0–36.0)
MCV: 87.1 fL (ref 80.0–100.0)
Platelets: 242 10*3/uL (ref 150–400)
RBC: 3.71 MIL/uL — ABNORMAL LOW (ref 3.87–5.11)
RDW: 23.1 % — ABNORMAL HIGH (ref 11.5–15.5)
WBC: 7.2 10*3/uL (ref 4.0–10.5)
nRBC: 0 % (ref 0.0–0.2)

## 2022-08-13 LAB — ETHANOL: Alcohol, Ethyl (B): 255 mg/dL — ABNORMAL HIGH (ref <10)
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
## 2022-08-13 NOTE — ED Notes (Signed)
Pt states that she does not want to stay and be seen. Pt is calling her husband to come pick her up.

## 2022-08-17 ENCOUNTER — Other Ambulatory Visit: Payer: Self-pay

## 2022-08-17 ENCOUNTER — Emergency Department
Admission: EM | Admit: 2022-08-17 | Discharge: 2022-08-18 | Disposition: A | Discharge status: Home / Self Care | Payer: Medicaid Other | Attending: Emergency Medicine | Admitting: Emergency Medicine

## 2022-08-17 ENCOUNTER — Encounter: Payer: Self-pay | Admitting: Emergency Medicine

## 2022-08-17 DIAGNOSIS — F1914 Other psychoactive substance abuse with psychoactive substance-induced mood disorder: Secondary | ICD-10-CM | POA: Insufficient documentation

## 2022-08-17 DIAGNOSIS — Z20822 Contact with and (suspected) exposure to covid-19: Secondary | ICD-10-CM | POA: Insufficient documentation

## 2022-08-17 DIAGNOSIS — Z046 Encounter for general psychiatric examination, requested by authority: Secondary | ICD-10-CM | POA: Insufficient documentation

## 2022-08-17 DIAGNOSIS — Y908 Blood alcohol level of 240 mg/100 ml or more: Secondary | ICD-10-CM | POA: Insufficient documentation

## 2022-08-17 DIAGNOSIS — R440 Auditory hallucinations: Secondary | ICD-10-CM | POA: Insufficient documentation

## 2022-08-17 DIAGNOSIS — F319 Bipolar disorder, unspecified: Secondary | ICD-10-CM | POA: Insufficient documentation

## 2022-08-17 DIAGNOSIS — F209 Schizophrenia, unspecified: Secondary | ICD-10-CM

## 2022-08-17 DIAGNOSIS — F1490 Cocaine use, unspecified, uncomplicated: Secondary | ICD-10-CM

## 2022-08-17 DIAGNOSIS — F10129 Alcohol abuse with intoxication, unspecified: Secondary | ICD-10-CM

## 2022-08-17 DIAGNOSIS — F1994 Other psychoactive substance use, unspecified with psychoactive substance-induced mood disorder: Secondary | ICD-10-CM | POA: Diagnosis present

## 2022-08-17 DIAGNOSIS — R443 Hallucinations, unspecified: Secondary | ICD-10-CM

## 2022-08-17 DIAGNOSIS — Z789 Other specified health status: Secondary | ICD-10-CM | POA: Diagnosis present

## 2022-08-17 LAB — CBC
HCT: 35.2 % — ABNORMAL LOW (ref 36.0–46.0)
Hemoglobin: 11.3 g/dL — ABNORMAL LOW (ref 12.0–15.0)
MCH: 27.6 pg (ref 26.0–34.0)
MCHC: 32.1 g/dL (ref 30.0–36.0)
MCV: 85.9 fL (ref 80.0–100.0)
Platelets: 471 10*3/uL — ABNORMAL HIGH (ref 150–400)
RBC: 4.1 MIL/uL (ref 3.87–5.11)
RDW: 22.8 % — ABNORMAL HIGH (ref 11.5–15.5)
WBC: 6.1 10*3/uL (ref 4.0–10.5)
nRBC: 0 % (ref 0.0–0.2)

## 2022-08-17 LAB — COMPREHENSIVE METABOLIC PANEL
ALT: 18 U/L (ref 0–44)
AST: 28 U/L (ref 15–41)
Albumin: 3 g/dL — ABNORMAL LOW (ref 3.5–5.0)
Alkaline Phosphatase: 140 U/L — ABNORMAL HIGH (ref 38–126)
Anion gap: 9 (ref 5–15)
BUN: <5 mg/dL — ABNORMAL LOW (ref 6–20)
CO2: 26 mmol/L (ref 22–32)
Calcium: 8.6 mg/dL — ABNORMAL LOW (ref 8.9–10.3)
Chloride: 105 mmol/L (ref 98–111)
Creatinine, Ser: 0.54 mg/dL (ref 0.44–1.00)
GFR, Estimated: >60 mL/min (ref >60)
Glucose, Bld: 97 mg/dL (ref 70–99)
Potassium: 3.7 mmol/L (ref 3.5–5.1)
Sodium: 140 mmol/L (ref 135–145)
Total Bilirubin: 0.3 mg/dL (ref 0.3–1.2)
Total Protein: 7.1 g/dL (ref 6.5–8.1)

## 2022-08-17 LAB — URINE DRUG SCREEN, QUALITATIVE (ARMC ONLY)
Amphetamines, Ur Screen: NOT DETECTED
Barbiturates, Ur Screen: NOT DETECTED
Benzodiazepine, Ur Scrn: NOT DETECTED
Cannabinoid 50 Ng, Ur ~~LOC~~: NOT DETECTED
Cocaine Metabolite,Ur ~~LOC~~: POSITIVE — AB
MDMA (Ecstasy)Ur Screen: NOT DETECTED
Methadone Scn, Ur: NOT DETECTED
Opiate, Ur Screen: NOT DETECTED
Phencyclidine (PCP) Ur S: NOT DETECTED
Tricyclic, Ur Screen: NOT DETECTED

## 2022-08-17 LAB — POC URINE PREG, ED: Preg Test, Ur: NEGATIVE

## 2022-08-17 LAB — ACETAMINOPHEN LEVEL: Acetaminophen (Tylenol), Serum: <10 ug/mL — ABNORMAL LOW (ref 10–30)

## 2022-08-17 LAB — ETHANOL: Alcohol, Ethyl (B): 322 mg/dL (ref <10)

## 2022-08-17 LAB — SALICYLATE LEVEL: Salicylate Lvl: <7 mg/dL — ABNORMAL LOW (ref 7.0–30.0)

## 2022-08-17 LAB — RESP PANEL BY RT-PCR (FLU A&B, COVID) ARPGX2
Influenza A by PCR: NEGATIVE
Influenza B by PCR: NEGATIVE
SARS Coronavirus 2 by RT PCR: NEGATIVE

## 2022-08-17 MED ORDER — ALBUTEROL SULFATE HFA 108 (90 BASE) MCG/ACT IN AERS
1.0000 | INHALATION_SPRAY | Freq: Four times a day (QID) | RESPIRATORY_TRACT | Status: DC | PRN
  Filled 2022-08-17: qty 6.7

## 2022-08-17 NOTE — ED Notes (Signed)
Snack and beverage given. 

## 2022-08-17 NOTE — ED Notes (Signed)
Pt given snack tray, icecream and drink.  Pt covid swabbed
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
## 2022-08-17 NOTE — ED Notes (Signed)
Patient dressed out by this RN and Caitlyn, EDT in hospital provided scrubs. Pt belongings placed in belongings bag and labeled with pt label and green tag. Pt had with them the following belongings:   1 pair of dark blue slip on shoes  1 pair of camo legging pants  1 green poky dot bra 1 pink hair tie 4 silver metal rings  Pt belongings taken with patient to quad. Pt calm and cooperative.
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
## 2022-08-18 DIAGNOSIS — F1490 Cocaine use, unspecified, uncomplicated: Secondary | ICD-10-CM

## 2022-08-18 DIAGNOSIS — F10129 Alcohol abuse with intoxication, unspecified: Secondary | ICD-10-CM

## 2022-08-18 DIAGNOSIS — R443 Hallucinations, unspecified: Secondary | ICD-10-CM | POA: Insufficient documentation

## 2022-08-18 DIAGNOSIS — F209 Schizophrenia, unspecified: Secondary | ICD-10-CM

## 2022-08-18 DIAGNOSIS — Z789 Other specified health status: Secondary | ICD-10-CM

## 2022-08-18 DIAGNOSIS — F319 Bipolar disorder, unspecified: Secondary | ICD-10-CM

## 2022-08-18 NOTE — ED Provider Notes (Signed)
Patient was seen by psychiatry and cleared for discharge.  She is no longer under IVC.  Released from IVC order completed.   Georga Hacking, MD 08/18/22 [phone]

## 2022-08-18 NOTE — ED Provider Notes (Signed)
Emergency Medicine Observation Re-evaluation Note  Katrina Turner is a 41 y.o. female, seen on rounds today.  Pt initially presented to the ED for complaints of IVC Currently, the patient is resting.  Physical Exam  BP (!) 108/90 (BP Location: Left Arm)   Pulse 67   Temp 98.4 F (36.9 C) (Oral)   Resp 16   SpO2 100%  Physical Exam General: resting no distress Lungs: unlabored breathing Psych: no agitation  ED Course / MDM  EKG:   I have reviewed the labs performed to date as well as medications administered while in observation.  Recent changes in the last 24 hours include no reported morning events.  Plan  Current plan is for sw dispo.    Pilar Jarvis, MD 08/18/22 [phone]

## 2022-08-18 NOTE — ED Notes (Signed)
Pt given snack. 

## 2022-08-18 NOTE — ED Notes (Signed)
Lunch tray and beverage provided 
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
## 2022-08-18 NOTE — ED Notes (Signed)
Breakfast tray and beverage provided 

## 2022-08-18 NOTE — ED Notes (Signed)
Ivc / psych consult complete /Re-assess this am

## 2022-08-18 NOTE — Consult Note (Signed)
[facility] Psych ED Progress Note  08/18/2022 6:22 PM Katrina Turner  MRN:  [id number]   Method of visit?: Face to Face   Subjective: "I am ready to go home."  On re-evaluation this afternoon, the patient is alert, oriented x 4.  She appears to have metabolized substances.  She denies suicidal or homicidal ideation, paranoia, auditory or visual hallucinations.  She is speaking in clear, coherent sentences.  She states that she does have outpatient therapy in [place] and she will return there, stating that they are going to help her with detox/rehab.  Discussion with patient regarding her continued alcohol use.  Denies cocaine use, even though her UDS was positive for cocaine.   Principal Problem: Alcohol abuse with intoxication (HCC) Diagnosis:  Principal Problem:   Alcohol abuse with intoxication (HCC) Active Problems:   Alcohol use   Bipolar 1 disorder (HCC)   Substance induced mood disorder (HCC)   Cocaine use  Total Time spent with patient: 20 minutes  Past Psychiatric History: See previous  Past Medical History:  Past Medical History:  Diagnosis Date   Alcohol abuse    Allergy    Bipolar 1 disorder (HCC)    Depression    GERD (gastroesophageal reflux disease)    Neuromuscular disorder (HCC)    Pancreatitis 11/28/2019   approximated    Paranoid schizophrenia (HCC)    PTSD (post-traumatic stress disorder)    Reynolds syndrome [facility])     Past Surgical History:  Procedure Laterality Date   APPENDECTOMY     CESAREAN SECTION     ESOPHAGOGASTRODUODENOSCOPY N/A 10/17/2020   Procedure: ESOPHAGOGASTRODUODENOSCOPY (EGD);  Surgeon: Toledo, Boykin Nearing, MD;  Location: [facility] ENDOSCOPY;  Service: Gastroenterology;  Laterality: N/A;   HERNIA REPAIR     PARTIAL HYSTERECTOMY     ovaries present not uterus- precancerous cells of uterus    ROUX-EN-Y GASTRIC BYPASS     Family History:  Family History  Problem Relation Age of Onset   Heart attack Mother    Diabetes Father    Heart disease  Father    Schizophrenia Brother    Autism Son    Pancreatic cancer Maternal Grandmother    Heart disease Maternal Grandfather    Seizures Paternal Grandmother    Heart disease Paternal Grandfather    Prostate cancer Paternal Grandfather    Family Psychiatric  History:  Social History:  Social History   Substance and Sexual Activity  Alcohol Use Yes   Alcohol/week: 36.0 standard drinks of alcohol   Types: 36 Cans of beer per week   Comment: pt states a qt. a day     Social History   Substance and Sexual Activity  Drug Use Not Currently    Social History   Socioeconomic History   Marital status: Married    Spouse name: [name]   Number of children: 1   Years of education: Not on file   Highest education level: Not on file  Occupational History   Not on file  Tobacco Use   Smoking status: Every Day    Packs/day: 0.50    Years: 7.00    Total pack years: 3.50    Types: Cigarettes   Smokeless tobacco: Never  Vaping Use   Vaping Use: Never used  Substance and Sexual Activity   Alcohol use: Yes    Alcohol/week: 36.0 standard drinks of alcohol    Types: 36 Cans of beer per week    Comment: pt states a qt. a day   Drug  use: Not Currently   Sexual activity: Yes  Other Topics Concern   Not on file  Social History Narrative   Not on file   Social Determinants of Health   Financial Resource Strain: Low Risk  (05/25/2019)   Overall Financial Resource Strain (CARDIA)    Difficulty of Paying Living Expenses: Not very hard  Food Insecurity: No Food Insecurity (09/05/2021)   Hunger Vital Sign    Worried About Running Out of Food in the Last Year: Never true    Ran Out of Food in the Last Year: Never true  Transportation Needs: No Transportation Needs (09/05/2021)   PRAPARE - Administrator, Civil Service (Medical): No    Lack of Transportation (Non-Medical): No  Physical Activity: Unknown (05/25/2019)   Exercise Vital Sign    Days of Exercise per Week:  Patient refused    Minutes of Exercise per Session: Patient refused  Stress: No Stress Concern Present (05/25/2019)   [employer] of Occupational Health - Occupational Stress Questionnaire    Feeling of Stress : Only a little  Social Connections: Unknown (05/25/2019)   Social Connection and Isolation Panel [NHANES]    Frequency of Communication with Friends and Family: Patient refused    Frequency of Social Gatherings with Friends and Family: Patient refused    Attends Religious Services: Patient refused    Database administrator or Organizations: Patient refused    Attends Engineer, structural: Patient refused    Marital Status: Patient refused    Sleep: Good  Appetite:  Good  Current Medications: Current Facility-Administered Medications  Medication Dose Route Frequency Provider Last Rate Last Admin   albuterol (VENTOLIN HFA) 108 (90 Base) MCG/ACT inhaler 1-2 puff  1-2 puff Inhalation Q6H PRN Ward, Layla Maw, DO       Current Outpatient Medications  Medication Sig Dispense Refill   albuterol (VENTOLIN HFA) 108 (90 Base) MCG/ACT inhaler Inhale 1-2 puffs into the lungs every 6 (six) hours as needed for wheezing or shortness of breath (excercise induced asthma). (Patient not taking: Reported on 07/18/2022) 6.7 g 2   baclofen (LIORESAL) 10 MG tablet Take 1 tablet (10 mg total) by mouth 3 (three) times daily. (Patient not taking: Reported on 07/08/2022) 30 each 0   cloNIDine (CATAPRES) 0.1 MG tablet TAKE 1 TABLET BY MOUTH DAILY (Patient not taking: Reported on 07/08/2022) 30 tablet 0   docusate sodium (COLACE) 100 MG capsule Take 100 mg by mouth 2 (two) times daily. (Patient not taking: Reported on 07/08/2022)     fluticasone (FLONASE) 50 MCG/ACT nasal spray Place into both nostrils. (Patient not taking: Reported on 07/08/2022)     gabapentin (NEURONTIN) 100 MG capsule Take 2 capsules (200 mg total) by mouth 2 (two) times daily. (Patient not taking: Reported on 07/08/2022) 120 capsule 0    hydrOXYzine (VISTARIL) 50 MG capsule Take 1 capsule (50 mg total) by mouth 3 (three) times daily. (Patient not taking: Reported on 07/08/2022) 30 capsule 0   naltrexone (DEPADE) 50 MG tablet Take 50 mg by mouth daily. (Patient not taking: Reported on 07/08/2022)     pantoprazole (PROTONIX) 40 MG tablet Take 1 tablet (40 mg total) by mouth once daily. (Patient not taking: Reported on 07/08/2022) 30 tablet 0   prazosin (MINIPRESS) 1 MG capsule Take 1 mg by mouth at bedtime. (Patient not taking: Reported on 07/08/2022)     propranolol (INDERAL) 10 MG tablet TAKE 1 TABLET(10 MG) BY MOUTH THREE TIMES DAILY (Patient not  taking: Reported on 07/08/2022) 90 tablet 0   QUEtiapine (SEROQUEL) 100 MG tablet TAKE 1 TABLET(100 MG) BY MOUTH AT BEDTIME (Patient not taking: Reported on 07/08/2022) 30 tablet 0   rOPINIRole (REQUIP) 0.5 MG tablet Take 0.5 mg by mouth at bedtime. (Patient not taking: Reported on 07/08/2022)     SUMAtriptan (IMITREX) 50 MG tablet Take 1 tablet (50 mg total) by mouth once daily for 1 dose. May repeat in 2 hours if headache persists or recurs. (Max of 2 doses in 24 hours). 10 tablet 0   venlafaxine (EFFEXOR) 37.5 MG tablet TAKE 1 TABLET(37.5 MG) BY MOUTH TWICE DAILY (Patient not taking: Reported on 07/08/2022) 60 tablet 0    Lab Results:  Results for orders placed or performed during the hospital encounter of 08/17/22 (from the past 48 hour(s))  Comprehensive metabolic panel     Status: Abnormal   Collection Time: 08/17/22  6:15 PM  Result Value Ref Range   Sodium 140 135 - 145 mmol/L   Potassium 3.7 3.5 - 5.1 mmol/L   Chloride 105 98 - 111 mmol/L   CO2 26 22 - 32 mmol/L   Glucose, Bld 97 70 - 99 mg/dL    Comment: Glucose reference range applies only to samples taken after fasting for at least 8 hours.   BUN <5 (L) 6 - 20 mg/dL   Creatinine, Ser 1.61 0.44 - 1.00 mg/dL   Calcium 8.6 (L) 8.9 - 10.3 mg/dL   Total Protein 7.1 6.5 - 8.1 g/dL   Albumin 3.0 (L) 3.5 - 5.0 g/dL   AST 28 15 - 41 U/L    ALT 18 0 - 44 U/L   Alkaline Phosphatase 140 (H) 38 - 126 U/L   Total Bilirubin 0.3 0.3 - 1.2 mg/dL   GFR, Estimated >09 >60 mL/min    Comment: (NOTE) Calculated using the CKD-EPI Creatinine Equation (2021)    Anion gap 9 5 - 15    Comment: Performed at [facility], [address] [place], [place] [address]  Ethanol     Status: Abnormal   Collection Time: 08/17/22  6:15 PM  Result Value Ref Range   Alcohol, Ethyl (B) 322 (HH) <10 mg/dL    Comment: CRITICAL RESULT CALLED TO, READ BACK BY AND VERIFIED WITH Chrissie Noa SMITH AT 1859 08/17/2022 DLB (NOTE) Lowest detectable limit for serum alcohol is 10 mg/dL.  For medical purposes only. Performed at [facility], [address] [place], [place] [address]   Salicylate level     Status: Abnormal   Collection Time: 08/17/22  6:15 PM  Result Value Ref Range   Salicylate Lvl <7.0 (L) 7.0 - 30.0 mg/dL    Comment: Performed at [facility], [address] [place], [place] [address]  Acetaminophen level     Status: Abnormal   Collection Time: 08/17/22  6:15 PM  Result Value Ref Range   Acetaminophen (Tylenol), Serum <10 (L) 10 - 30 ug/mL    Comment: (NOTE) Therapeutic concentrations vary significantly. A range of 10-30 ug/mL  may be an effective concentration for many patients. However, some  are best treated at concentrations outside of this range. Acetaminophen concentrations >150 ug/mL at 4 hours after ingestion  and >50 ug/mL at 12 hours after ingestion are often associated with  toxic reactions.  Performed at [facility], [address] [place] [place], [place] [address]   cbc     Status: Abnormal   Collection Time: 08/17/22  6:15 PM  Result Value  Ref Range   WBC 6.1 4.0 - 10.5 K/uL   RBC 4.10 3.87 - 5.11 MIL/uL   Hemoglobin 11.3 (L) 12.0 - 15.0 g/dL   HCT 16.135.2 (L) 09.636.0 - 04.546.0 %   MCV 85.9 80.0 - 100.0 fL   MCH 27.6 26.0 - 34.0 pg   MCHC 32.1 30.0 - 36.0 g/dL   RDW 40.922.8 (H) 81.111.5 - 91.415.5 %    Platelets 471 (H) 150 - 400 K/uL   nRBC 0.0 0.0 - 0.2 %    Comment: Performed at [facility], [address] [place], [place] [address]  Urine Drug Screen, Qualitative     Status: Abnormal   Collection Time: 08/17/22  6:15 PM  Result Value Ref Range   Tricyclic, Ur Screen NONE DETECTED NONE DETECTED   Amphetamines, Ur Screen NONE DETECTED NONE DETECTED   MDMA (Ecstasy)Ur Screen NONE DETECTED NONE DETECTED   Cocaine Metabolite,Ur [place] POSITIVE (A) NONE DETECTED   Opiate, Ur Screen NONE DETECTED NONE DETECTED   Phencyclidine (PCP) Ur S NONE DETECTED NONE DETECTED   Cannabinoid 50 Ng, Ur [place] NONE DETECTED NONE DETECTED   Barbiturates, Ur Screen NONE DETECTED NONE DETECTED   Benzodiazepine, Ur Scrn NONE DETECTED NONE DETECTED   Methadone Scn, Ur NONE DETECTED NONE DETECTED    Comment: (NOTE) Tricyclics + metabolites, urine    Cutoff 1000 ng/mL Amphetamines + metabolites, urine  Cutoff 1000 ng/mL MDMA (Ecstasy), urine              Cutoff 500 ng/mL Cocaine Metabolite, urine          Cutoff 300 ng/mL Opiate + metabolites, urine        Cutoff 300 ng/mL Phencyclidine (PCP), urine         Cutoff 25 ng/mL Cannabinoid, urine                 Cutoff 50 ng/mL Barbiturates + metabolites, urine  Cutoff 200 ng/mL Benzodiazepine, urine              Cutoff 200 ng/mL Methadone, urine                   Cutoff 300 ng/mL  The urine drug screen provides only a preliminary, unconfirmed analytical test result and should not be used for non-medical purposes. Clinical consideration and professional judgment should be applied to any positive drug screen result due to possible interfering substances. A more specific alternate chemical method must be used in order to obtain a confirmed analytical result. Gas chromatography / mass spectrometry (GC/MS) is the preferred confirm atory method. Performed at [facility], [address] [place], [place] [address]   POC urine preg, ED (not at  [facility])     Status: None   Collection Time: 08/17/22  6:29 PM  Result Value Ref Range   Preg Test, Ur NEGATIVE NEGATIVE    Comment:        THE SENSITIVITY OF THIS METHODOLOGY IS >24 mIU/mL   Resp Panel by RT-PCR (Flu A&B, Covid) Anterior Nasal Swab     Status: None   Collection Time: 08/17/22  8:33 PM   Specimen: Anterior Nasal Swab  Result Value Ref Range   SARS Coronavirus 2 by RT PCR NEGATIVE NEGATIVE    Comment: (NOTE) SARS-CoV-2 target nucleic acids are NOT DETECTED.  The SARS-CoV-2 RNA is generally detectable in upper respiratory specimens during the acute phase of infection. The lowest concentration of SARS-CoV-2 viral copies this assay can detect is 138  copies/mL. A negative result does not preclude SARS-Cov-2 infection and should not be used as the sole basis for treatment or other patient management decisions. A negative result may occur with  improper specimen collection/handling, submission of specimen other than nasopharyngeal swab, presence of viral mutation(s) within the areas targeted by this assay, and inadequate number of viral copies(<138 copies/mL). A negative result must be combined with clinical observations, patient history, and epidemiological information. The expected result is Negative.  Fact Sheet for Patients:  BloggerCourse.com  Fact Sheet for Healthcare Providers:  SeriousBroker.it  This test is no t yet approved or cleared by the [place] FDA and  has been authorized for detection and/or diagnosis of SARS-CoV-2 by FDA under an Emergency Use Authorization (EUA). This EUA will remain  in effect (meaning this test can be used) for the duration of the COVID-19 declaration under Section 564(b)(1) of the Act, 21 U.S.C.section 360bbb-3(b)(1), unless the authorization is terminated  or revoked sooner.       Influenza A by PCR NEGATIVE NEGATIVE   Influenza B by PCR NEGATIVE NEGATIVE    Comment:  (NOTE) The Xpert Xpress SARS-CoV-2/FLU/RSV plus assay is intended as an aid in the diagnosis of influenza from Nasopharyngeal swab specimens and should not be used as a sole basis for treatment. Nasal washings and aspirates are unacceptable for Xpert Xpress SARS-CoV-2/FLU/RSV testing.  Fact Sheet for Patients: BloggerCourse.com  Fact Sheet for Healthcare Providers: SeriousBroker.it  This test is not yet approved or cleared by the [place] FDA and has been authorized for detection and/or diagnosis of SARS-CoV-2 by FDA under an Emergency Use Authorization (EUA). This EUA will remain in effect (meaning this test can be used) for the duration of the COVID-19 declaration under Section 564(b)(1) of the Act, 21 U.S.C. section 360bbb-3(b)(1), unless the authorization is terminated or revoked.  Performed at [facility], [address] [place], [place] [address]     Blood Alcohol level:  Lab Results  Component Value Date   ETH 322 [facility]) 08/17/2022   ETH 255 (H) 08/13/2022    Physical Findings: AIMS:  , ,  ,  ,    CIWA:    COWS:     Musculoskeletal: Strength & Muscle Tone: within normal limits Gait & Station: normal Patient leans: N/A  Psychiatric Specialty Exam:  Presentation  General Appearance: Appropriate for Environment  Eye Contact:Good  Speech:Clear and Coherent  Speech Volume:Normal  Handedness:Right   Mood and Affect  Mood:Euthymic  Affect:Congruent   Thought Process  Thought Processes:Coherent  Descriptions of Associations:Intact  Orientation:Full (Time, Place and Person)  Thought Content:WDL  History of Schizophrenia/Schizoaffective disorder:Yes  Duration of Psychotic Symptoms:Greater than six months  Hallucinations:No data recorded Ideas of Reference:None  Suicidal Thoughts:Suicidal Thoughts: No  Homicidal Thoughts:Homicidal Thoughts: No   Sensorium  Memory:Immediate  Good  Judgment:Fair  Insight:Poor   Executive Functions  Concentration:Fair  Attention Span:Fair  Recall:Fair  Fund of Knowledge:Fair  Language:Fair   Psychomotor Activity  Psychomotor Activity:Psychomotor Activity: Normal   Assets  Assets:Social Support; Resilience; Physical Health; Housing   Sleep  Sleep:Sleep: Good    Physical Exam: Physical Exam Vitals (Okay to discharge per Dr. Sidney Ace,) reviewed.  HENT:     Head: Normocephalic.     Nose: No congestion or rhinorrhea.  Eyes:     General:        Right eye: No discharge.        Left eye: No discharge.  Cardiovascular:     Rate and Rhythm:  Normal rate.  Pulmonary:     Effort: Pulmonary effort is normal.  Musculoskeletal:        General: Normal range of motion.     Cervical back: Normal range of motion.  Skin:    General: Skin is dry.  Neurological:     Mental Status: She is alert and oriented to person, place, and time.  Psychiatric:        Attention and Perception: Attention normal.        Mood and Affect: Mood normal.        Behavior: Behavior normal.        Thought Content: Thought content normal.        Judgment: Judgment is impulsive (By history).    Review of Systems  Constitutional: Negative.   HENT: Negative.    Eyes: Negative.   Respiratory: Negative.    Musculoskeletal: Negative.   Neurological: Negative.   Psychiatric/Behavioral:  Positive for substance abuse. Negative for depression, hallucinations, memory loss and suicidal ideas. The patient is not nervous/anxious and does not have insomnia.    Blood pressure (!) 181/107, pulse 86, temperature 98 F (36.7 C), temperature source Oral, resp. rate 18, SpO2 98 %. There is no height or weight on file to calculate BMI.  Treatment Plan Summary: Plan discharge with outpatient resources.  Reviewed with EDP  Vanetta Mulders, NP 08/18/2022, 6:22 PM
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
## 2022-08-18 NOTE — ED Notes (Signed)
Pt given dinner tray and drink. Pt also given belongings to change back into her clothes for discharge.

## 2022-08-27 ENCOUNTER — Ambulatory Visit: Payer: Medicaid Other | Admitting: Gerontology

## 2023-01-20 ENCOUNTER — Other Ambulatory Visit: Payer: Self-pay | Admitting: Physician Assistant

## 2023-01-20 DIAGNOSIS — R200 Anesthesia of skin: Secondary | ICD-10-CM

## 2023-01-20 DIAGNOSIS — M5416 Radiculopathy, lumbar region: Secondary | ICD-10-CM

## 2023-01-20 DIAGNOSIS — G8929 Other chronic pain: Secondary | ICD-10-CM

## 2023-02-03 ENCOUNTER — Ambulatory Visit
Admission: RE | Admit: 2023-02-03 | Discharge: 2023-02-03 | Disposition: A | Discharge status: Home / Self Care | Payer: Medicaid Other | Source: Ambulatory Visit | Attending: Physician Assistant | Admitting: Physician Assistant

## 2023-02-03 DIAGNOSIS — R202 Paresthesia of skin: Secondary | ICD-10-CM | POA: Diagnosis present

## 2023-02-03 DIAGNOSIS — M545 Low back pain, unspecified: Secondary | ICD-10-CM | POA: Diagnosis present

## 2023-02-03 DIAGNOSIS — M5416 Radiculopathy, lumbar region: Secondary | ICD-10-CM

## 2023-02-03 DIAGNOSIS — R200 Anesthesia of skin: Secondary | ICD-10-CM | POA: Insufficient documentation

## 2023-02-03 DIAGNOSIS — G8929 Other chronic pain: Secondary | ICD-10-CM | POA: Insufficient documentation

## 2023-05-16 ENCOUNTER — Other Ambulatory Visit: Payer: Self-pay | Admitting: Physician Assistant

## 2023-05-16 DIAGNOSIS — F1994 Other psychoactive substance use, unspecified with psychoactive substance-induced mood disorder: Secondary | ICD-10-CM

## 2023-05-18 NOTE — Telephone Encounter (Signed)
NA to advise to schedule an appt.

## 2023-08-19 ENCOUNTER — Emergency Department
Admission: EM | Admit: 2023-08-19 | Discharge: 2023-08-20 | Disposition: A | Discharge status: Home / Self Care | Payer: MEDICAID | Attending: Student in an Organized Health Care Education/Training Program | Admitting: Student in an Organized Health Care Education/Training Program

## 2023-08-19 DIAGNOSIS — F319 Bipolar disorder, unspecified: Secondary | ICD-10-CM | POA: Diagnosis present

## 2023-08-19 DIAGNOSIS — F1010 Alcohol abuse, uncomplicated: Secondary | ICD-10-CM | POA: Diagnosis not present

## 2023-08-19 DIAGNOSIS — Z789 Other specified health status: Secondary | ICD-10-CM | POA: Diagnosis not present

## 2023-08-19 DIAGNOSIS — Y908 Blood alcohol level of 240 mg/100 ml or more: Secondary | ICD-10-CM | POA: Diagnosis not present

## 2023-08-19 DIAGNOSIS — F1090 Alcohol use, unspecified, uncomplicated: Secondary | ICD-10-CM | POA: Diagnosis present

## 2023-08-19 DIAGNOSIS — X781XXA Intentional self-harm by knife, initial encounter: Secondary | ICD-10-CM | POA: Insufficient documentation

## 2023-08-19 DIAGNOSIS — R45851 Suicidal ideations: Secondary | ICD-10-CM | POA: Diagnosis not present

## 2023-08-19 DIAGNOSIS — T1491XA Suicide attempt, initial encounter: Secondary | ICD-10-CM | POA: Diagnosis not present

## 2023-08-19 LAB — COMPREHENSIVE METABOLIC PANEL
ALT: 15 U/L (ref 0–44)
AST: 21 U/L (ref 15–41)
Albumin: 3.5 g/dL (ref 3.5–5.0)
Alkaline Phosphatase: 102 U/L (ref 38–126)
Anion gap: 12 (ref 5–15)
BUN: 9 mg/dL (ref 6–20)
CO2: 22 mmol/L (ref 22–32)
Calcium: 9 mg/dL (ref 8.9–10.3)
Chloride: 106 mmol/L (ref 98–111)
Creatinine, Ser: 0.45 mg/dL (ref 0.44–1.00)
GFR, Estimated: >60 mL/min (ref >60)
Glucose, Bld: 82 mg/dL (ref 70–99)
Potassium: 4.1 mmol/L (ref 3.5–5.1)
Sodium: 140 mmol/L (ref 135–145)
Total Bilirubin: 0.2 mg/dL — ABNORMAL LOW (ref 0.3–1.2)
Total Protein: 7.7 g/dL (ref 6.5–8.1)

## 2023-08-19 LAB — ETHANOL: Alcohol, Ethyl (B): 242 mg/dL — ABNORMAL HIGH (ref <10)

## 2023-08-19 LAB — ACETAMINOPHEN LEVEL: Acetaminophen (Tylenol), Serum: <10 ug/mL — ABNORMAL LOW (ref 10–30)

## 2023-08-19 LAB — CBC
HCT: 35.8 % — ABNORMAL LOW (ref 36.0–46.0)
Hemoglobin: 11.6 g/dL — ABNORMAL LOW (ref 12.0–15.0)
MCH: 26.1 pg (ref 26.0–34.0)
MCHC: 32.4 g/dL (ref 30.0–36.0)
MCV: 80.4 fL (ref 80.0–100.0)
Platelets: 416 10*3/uL — ABNORMAL HIGH (ref 150–400)
RBC: 4.45 MIL/uL (ref 3.87–5.11)
RDW: 18.2 % — ABNORMAL HIGH (ref 11.5–15.5)
WBC: 9.1 10*3/uL (ref 4.0–10.5)
nRBC: 0 % (ref 0.0–0.2)

## 2023-08-19 LAB — SALICYLATE LEVEL: Salicylate Lvl: <7 mg/dL — ABNORMAL LOW (ref 7.0–30.0)

## 2023-08-19 NOTE — ED Notes (Signed)
Dinner tray provided to pt

## 2023-08-19 NOTE — ED Notes (Addendum)
This NT provided pt with pm snack. 

## 2023-08-19 NOTE — ED Triage Notes (Signed)
Pt arrives via [place] [employer] voluntarily after originally calling 911 for suicidal thoughts. Pt reports recent homelessness. Pt states previously she had suicidal thoughts with plan to "slit my wrists with knives, but my blade is broken". Pt endorses command hallucinations telling her to "hurt people, they tell me I'm supposed to beat the shit out of people until I see red". Pt endorses recently smoking meth on Saturday.
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
## 2023-08-19 NOTE — ED Notes (Signed)
IVC PENDING  CONSULT ?

## 2023-08-19 NOTE — ED Notes (Signed)
Pt belongings placed in [facility] belongings closet

## 2023-08-19 NOTE — ED Notes (Signed)
Pt speaking with psych md via computer
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
## 2023-08-19 NOTE — ED Notes (Signed)
Hospital meal provided, pt tolerated w/o complaints.  Waste discarded appropriately.  
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
## 2023-08-19 NOTE — ED Notes (Signed)
Pt reporting she has been drinking, last drink was "when they picked me up". Pt also endorses peripheral neuropathy and "massive headache."

## 2023-08-19 NOTE — ED Notes (Addendum)
Pt belongings: Black tank top White bra Gray/pink pants Hair bow Nike tennis shoes Red/black heart underwear All pt jewelry in urine cup  Green glasses on person.

## 2023-08-20 DIAGNOSIS — Z789 Other specified health status: Secondary | ICD-10-CM

## 2023-08-20 DIAGNOSIS — R45851 Suicidal ideations: Secondary | ICD-10-CM | POA: Diagnosis not present

## 2023-08-20 DIAGNOSIS — F1010 Alcohol abuse, uncomplicated: Secondary | ICD-10-CM

## 2023-08-20 DIAGNOSIS — F319 Bipolar disorder, unspecified: Secondary | ICD-10-CM | POA: Diagnosis not present

## 2023-08-20 LAB — WET PREP, GENITAL
Clue Cells Wet Prep HPF POC: NONE SEEN
Sperm: NONE SEEN
Trich, Wet Prep: NONE SEEN
WBC, Wet Prep HPF POC: >=10 — AB (ref <10)
Yeast Wet Prep HPF POC: NONE SEEN

## 2023-08-20 LAB — CHLAMYDIA/NGC RT PCR (ARMC ONLY)
Chlamydia Tr: NOT DETECTED
N gonorrhoeae: NOT DETECTED

## 2023-08-20 NOTE — ED Notes (Signed)
ivc/reassess in the am/pending consult.

## 2023-08-20 NOTE — ED Notes (Signed)
IVC RESCINDED PER DR. Rosalia Hammers MD
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
## 2023-08-20 NOTE — Discharge Instructions (Addendum)
Return to the ER for new or worsening symptoms. °
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
## 2023-08-20 NOTE — ED Notes (Signed)
PT WAS MOVED FROM 24H TO BHU7

## 2023-09-05 IMAGING — CT CT ANGIO CHEST
2 of 6 series · 19 of 46 positions shown · IV contrast (omnipaque)
Comparison: None.

CLINICAL DATA: Two days of pleuritic chest pain on the right,
initial encounter

EXAM:
CT ANGIOGRAPHY CHEST WITH CONTRAST
TECHNIQUE: Multidetector CT imaging of the chest was performed using the
standard protocol during bolus administration of intravenous
contrast. Multiplanar CT image reconstructions and MIPs were
obtained to evaluate the vascular anatomy.
CONTRAST:  75mL OMNIPAQUE IOHEXOL 350 MG/ML SOLN. 4 hour
premedication was performed.

[Series 5: thins · axial · 0.77mm/px · z∈[-658,-371]mm · 16 of 315 slices shown]
[im 14/315  lung]
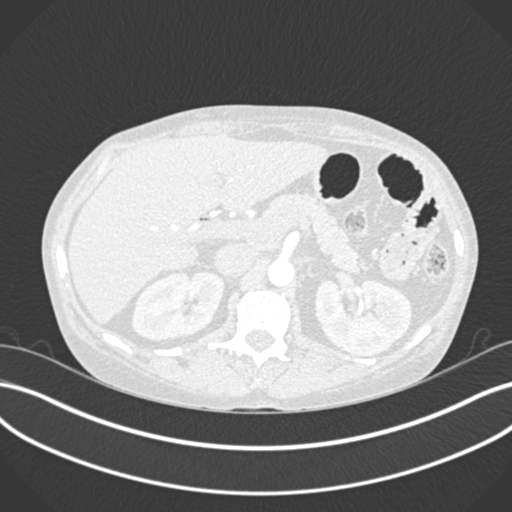
[im 41/315  soft-tissue]
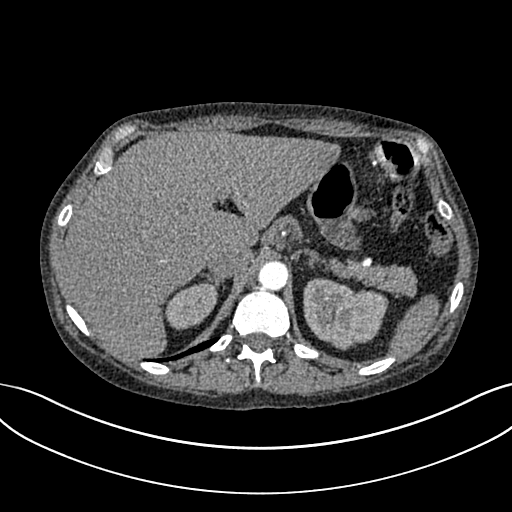
[im 55/315  lung]
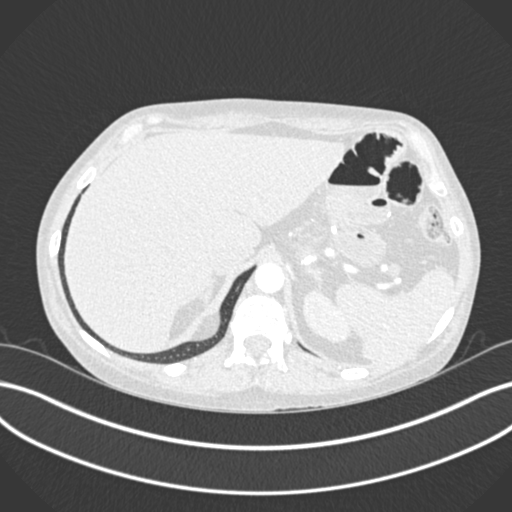
[im 69/315  soft-tissue]
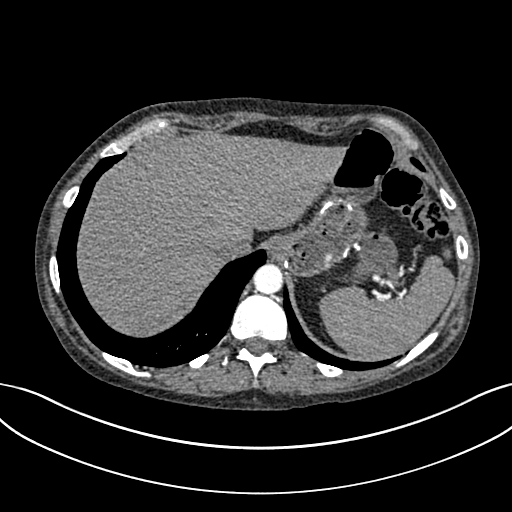
[im 96/315  lung]
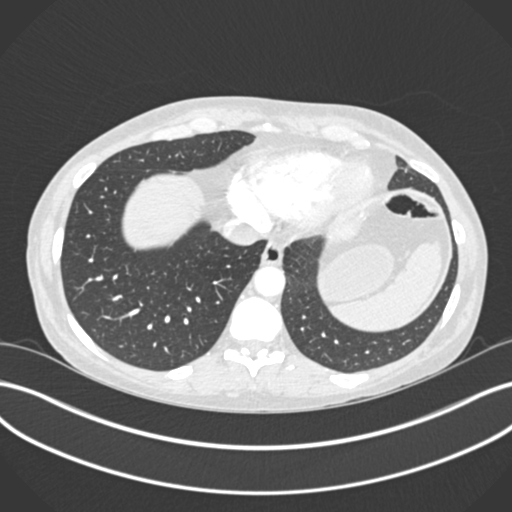
[im 110/315  soft-tissue]
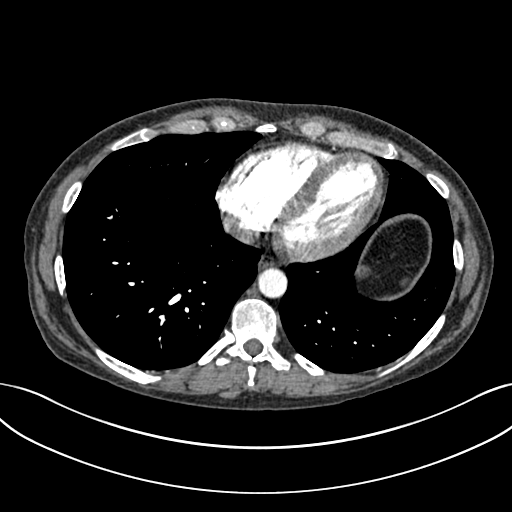
[im 123/315  lung]
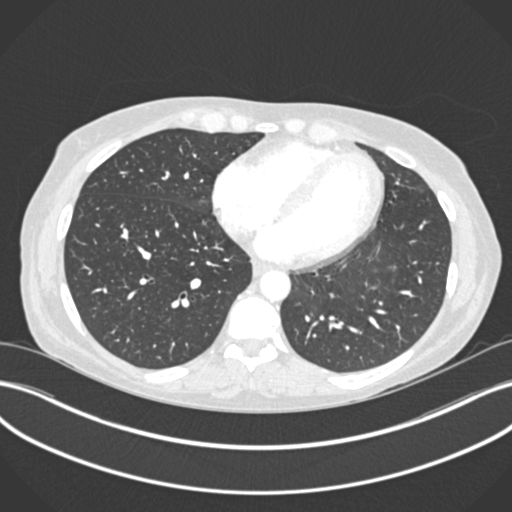
[im 151/315  soft-tissue]
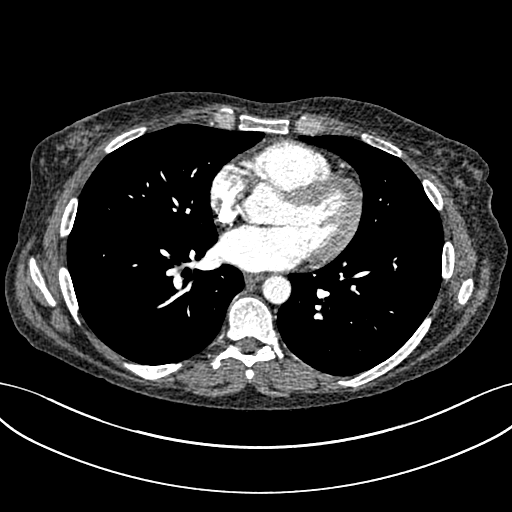
[im 164/315  lung]
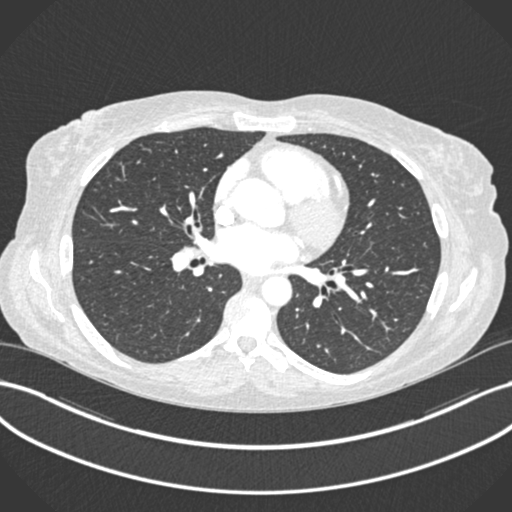
[im 192/315  soft-tissue]
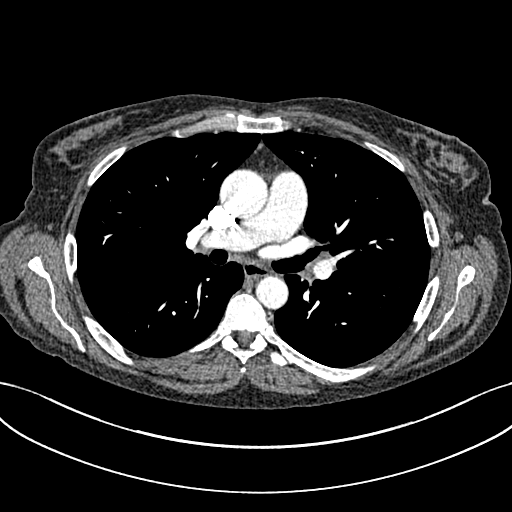
[im 205/315  lung]
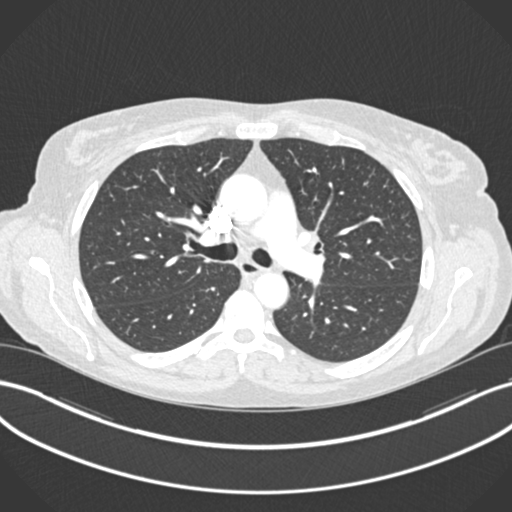
[im 219/315  soft-tissue]
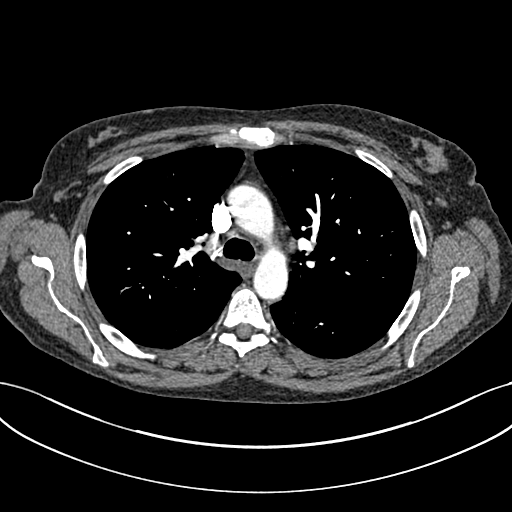
[im 246/315  lung]
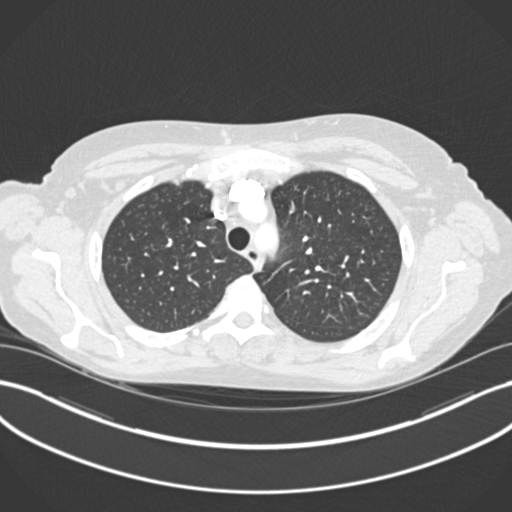
[im 260/315  soft-tissue]
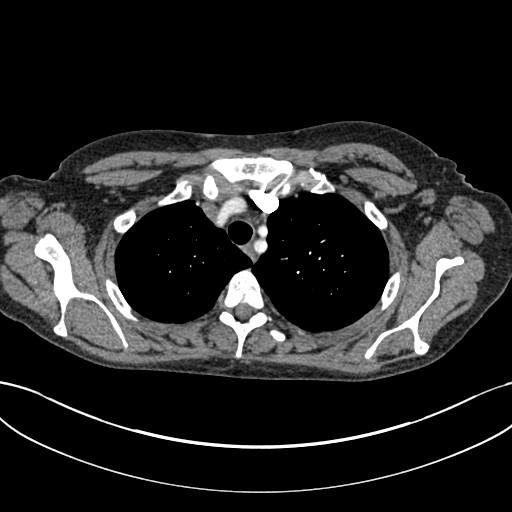
[im 274/315  lung]
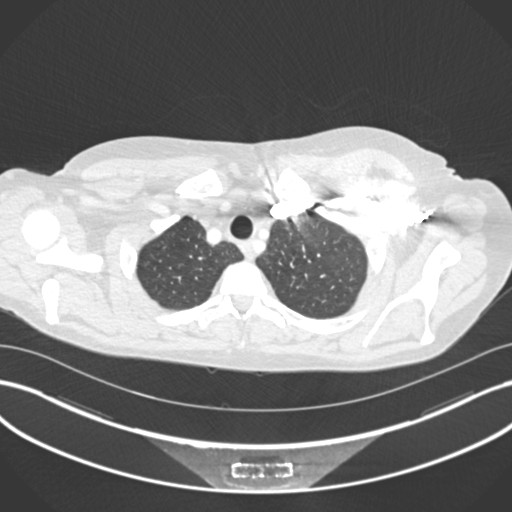
[im 301/315  soft-tissue]
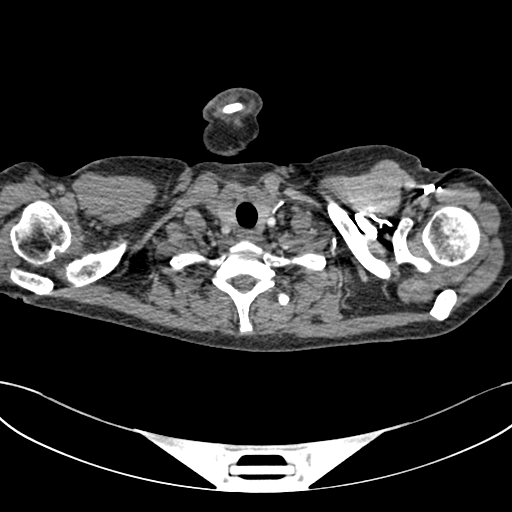

[Series 7: coronal mpr · coronal · 0.64mm/px · 3 of 127 slices shown]
[im 32/127  soft-tissue]
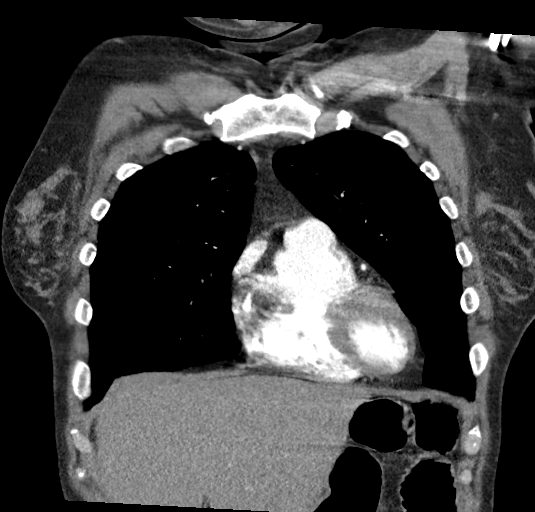
[im 64/127  soft-tissue]
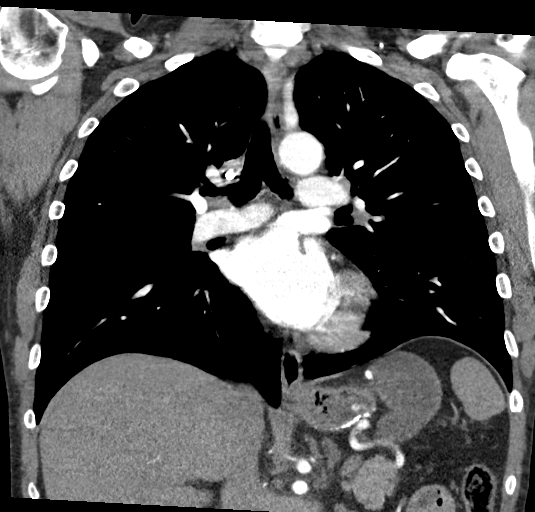
[im 95/127  soft-tissue]
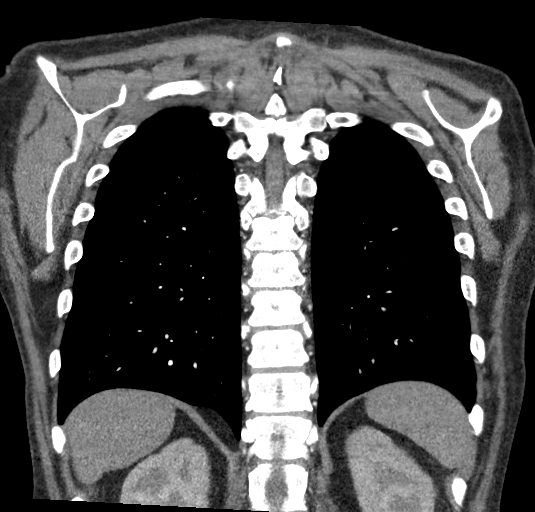

[19 of 46 positions shown; findings below may reference images not displayed]

FINDINGS: Cardiovascular: Thoracic aorta and its branches are well visualized
without aneurysmal dilatation or dissection. No cardiac enlargement
is seen. No coronary calcifications are noted. Pulmonary artery is
well visualized without filling defect to suggest pulmonary
embolism.

Mediastinum/Nodes: Thoracic inlet is within normal limits. No
sizable mediastinal lymph nodes are seen. Small bilateral hilar
lymph nodes are seen but not significant by size criteria likely
reactive in nature. The esophagus as visualized is within normal
limits.

Lungs/Pleura: Lungs are well aerated bilaterally. No focal
infiltrate or sizable effusion is seen. No parenchymal nodules are
noted.

Upper Abdomen: Visualized upper abdomen shows postsurgical changes
in the stomach consistent with prior gastric bypass. No other focal
abnormality is noted.

Musculoskeletal: No chest wall abnormality. No acute or significant
osseous findings.

Review of the MIP images confirms the above findings.
IMPRESSION: No evidence of pulmonary emboli.

No acute abnormality is seen.

## 2023-09-05 IMAGING — CT CT ABD-PELV W/ CM
2 of 5 series · 16 of 46 positions shown, 18 images · IV contrast (omnipaque)
Comparison: 10/16/2020

CLINICAL DATA: History of prior Roux-en-Y gastric bypass with
increasing abdominal pain, initial encounter

EXAM:
CT ABDOMEN AND PELVIS WITH CONTRAST
TECHNIQUE: Multidetector CT imaging of the abdomen and pelvis was performed
using the standard protocol following bolus administration of
intravenous contrast.
CONTRAST:  75mL OMNIPAQUE IOHEXOL 350 MG/ML SOLN

[Series 2: axial st · axial · 0.76mm/px · z∈[-967,-562]mm · 13 of 93 slices shown, 15 images]
[im 6/93  soft-tissue]
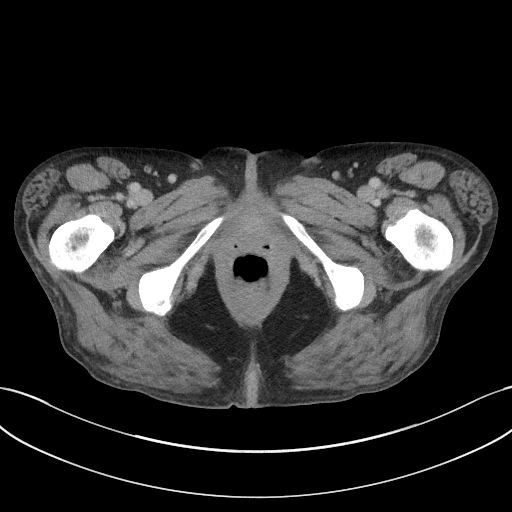
[im 6/93  bone]
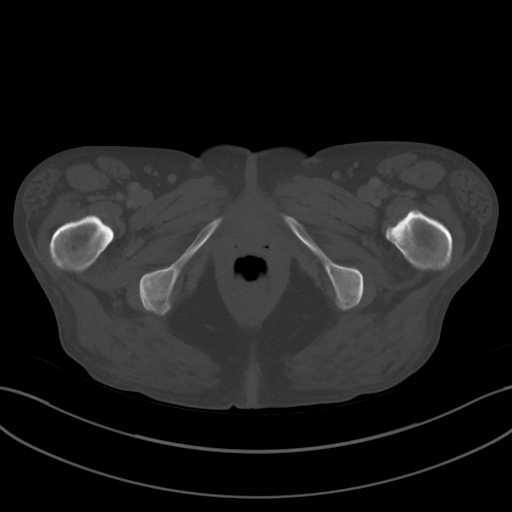
[im 11/93  soft-tissue]
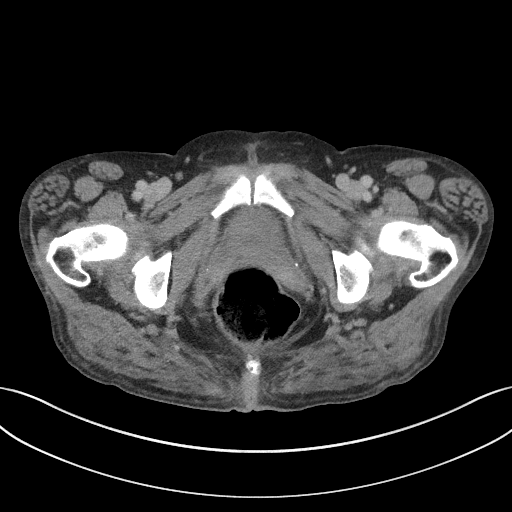
[im 22/93  soft-tissue]
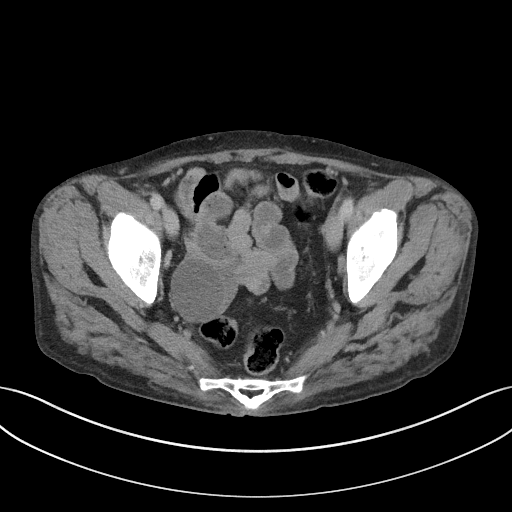
[im 28/93  soft-tissue]
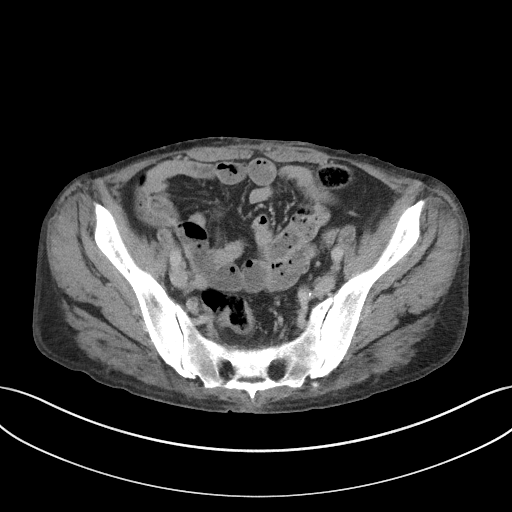
[im 33/93  soft-tissue]
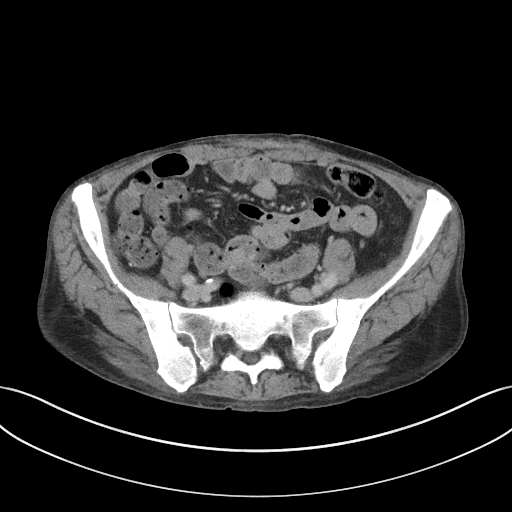
[im 38/93  soft-tissue]
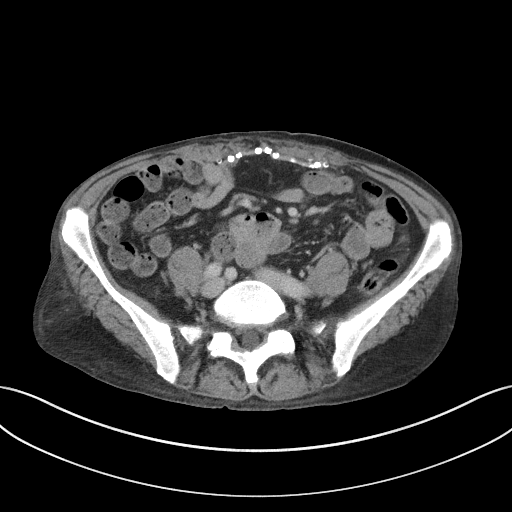
[im 49/93  soft-tissue]
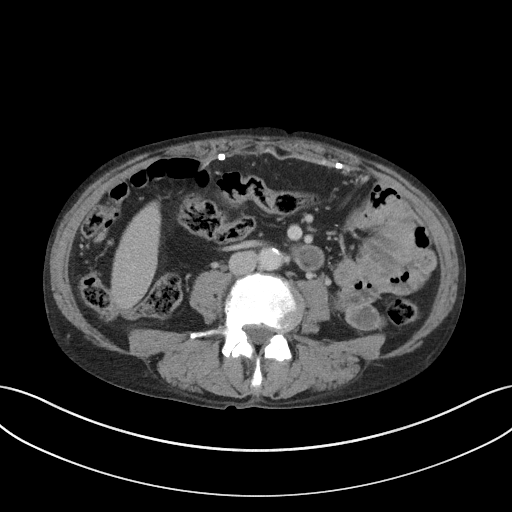
[im 55/93  soft-tissue]
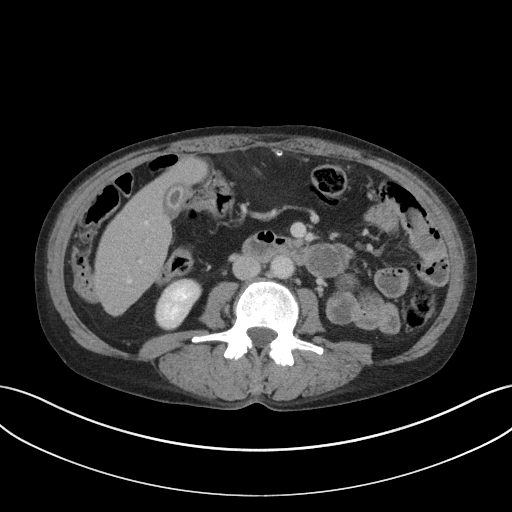
[im 60/93  soft-tissue]
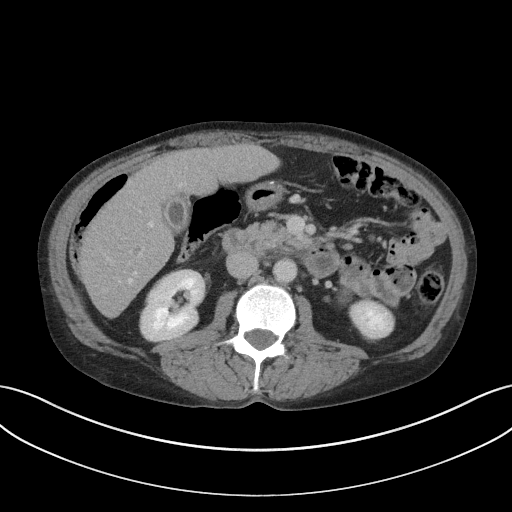
[im 60/93  bone]
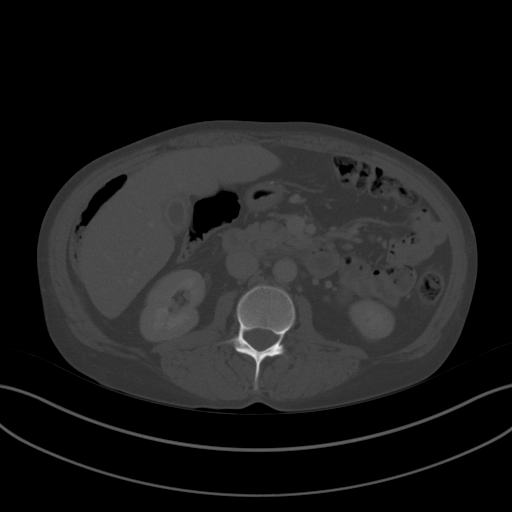
[im 65/93  soft-tissue]
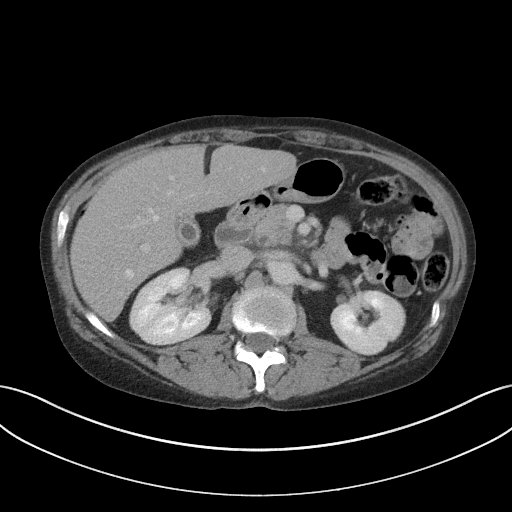
[im 71/93  soft-tissue]
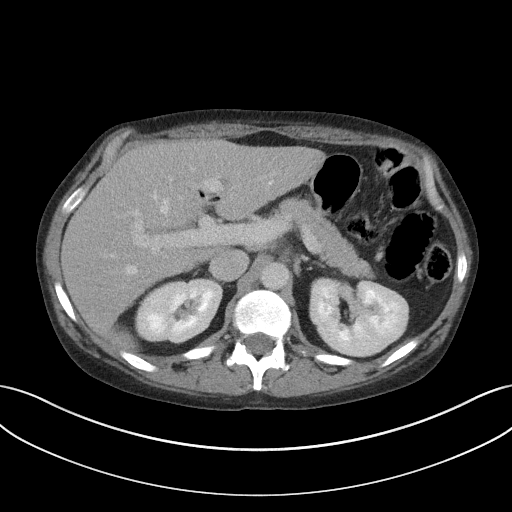
[im 82/93  soft-tissue]
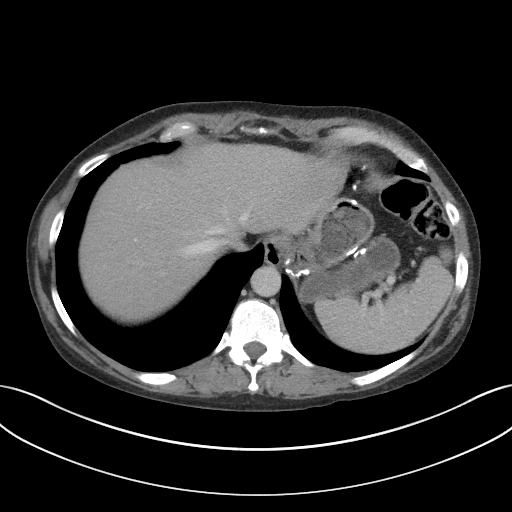
[im 87/93  soft-tissue]
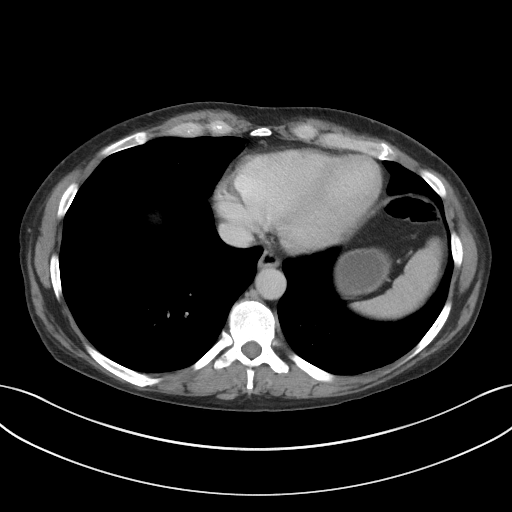

[Series 5: coronal st · coronal · 0.76mm/px · 3 of 83 slices shown]
[im 28/83  soft-tissue]
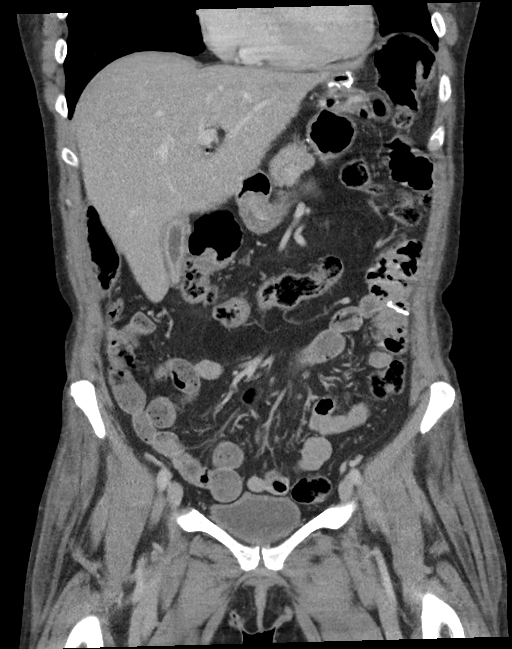
[im 37/83  soft-tissue]
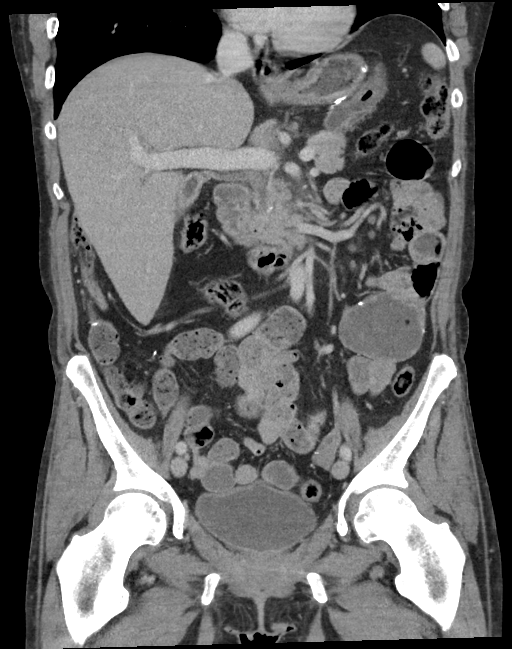
[im 46/83  soft-tissue]
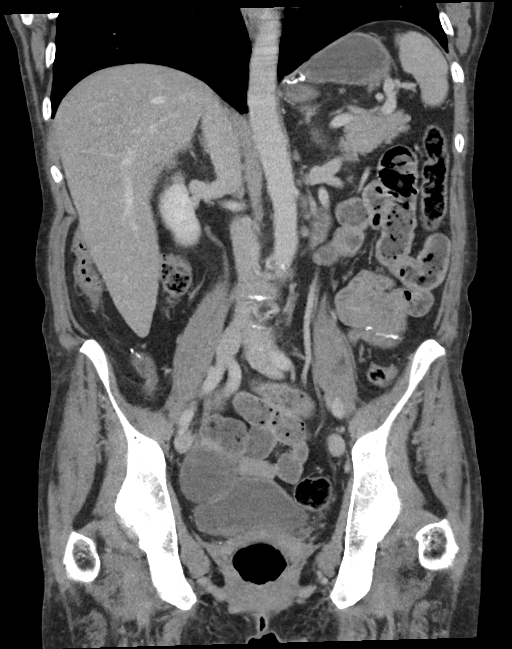

[16 of 46 positions shown; findings below may reference images not displayed]

FINDINGS: Lower chest: Within normal limits.

Hepatobiliary: Liver shows fatty infiltration with decompressed
gallbladder. Mild prominence of the gallbladder wall is noted
although likely related to decompression. Correlation with the
physical exam is recommended.

Pancreas: Few scattered calcifications are noted within the head of
the pancreas. No inflammatory changes are seen.

Spleen: Normal in size without focal abnormality.

Adrenals/Urinary Tract: Adrenal glands are within normal limits.
Kidneys demonstrate a normal enhancement pattern bilaterally. Normal
excretion is seen bilaterally. Bladder is partially distended.

Stomach/Bowel: Colon shows no obstructive or inflammatory changes.
Changes of prior appendectomy are seen. Stomach shows changes of
Roux-en-Y gastric bypass similar to that seen on the prior exam. No
obstructive changes are noted.

Vascular/Lymphatic: Aortic atherosclerosis. No enlarged abdominal or
pelvic lymph nodes.

Reproductive: Uterus has been surgically removed. 4.8 cm right
adnexal simple cyst is noted new from the prior exam. No left
adnexal abnormality is seen.

Other: No abdominal wall hernia or abnormality. No abdominopelvic
ascites. Changes of prior hernia repair are noted anteriorly.

Musculoskeletal: No acute or significant osseous findings.
IMPRESSION: No evidence of small-bowel obstruction.

Postsurgical changes are seen.

4.8 cm right adnexal simple-appearing cyst. No follow-up imaging is
recommended.

Reference: JACR [DATE]):[id number]

Fatty liver. Gallbladder is decompressed with some wall thickening
although felt to be related to the decompressed state. Ultrasound
could be performed as clinically indicated based on the physical
exam.

## 2024-05-30 ENCOUNTER — Emergency Department
Admission: EM | Admit: 2024-05-30 | Discharge: 2024-05-31 | Disposition: A | Discharge status: Home / Self Care | Payer: MEDICAID | Attending: Emergency Medicine | Admitting: Emergency Medicine

## 2024-05-30 ENCOUNTER — Other Ambulatory Visit: Payer: Self-pay

## 2024-05-30 DIAGNOSIS — F10129 Alcohol abuse with intoxication, unspecified: Secondary | ICD-10-CM | POA: Insufficient documentation

## 2024-05-30 DIAGNOSIS — F10920 Alcohol use, unspecified with intoxication, uncomplicated: Secondary | ICD-10-CM

## 2024-05-30 DIAGNOSIS — Z79899 Other long term (current) drug therapy: Secondary | ICD-10-CM | POA: Diagnosis not present

## 2024-05-30 DIAGNOSIS — R519 Headache, unspecified: Secondary | ICD-10-CM | POA: Insufficient documentation

## 2024-05-30 DIAGNOSIS — W19XXXA Unspecified fall, initial encounter: Secondary | ICD-10-CM | POA: Insufficient documentation

## 2024-05-30 DIAGNOSIS — Y908 Blood alcohol level of 240 mg/100 ml or more: Secondary | ICD-10-CM | POA: Diagnosis not present

## 2024-05-30 NOTE — ED Provider Notes (Signed)
 [facility] Provider Note    Event Date/Time   First MD Initiated Contact with Patient 05/30/24 2304     (approximate)   History   Alcohol  Intoxication and Fall   HPI  Katrina Turner is a 43 y.o. female with alcohol  use disorder, schizophrenia, PTSD, bipolar disorder who presents to the emergency department intoxicated.  Patient is unable to provide any meaningful history at this time only able to tell me that she drank alcohol  today.  Per triage note, patient fell.  She has abrasions noted throughout her body and dried grass, leaves to her back and hair.  I am not able to tell if she is having any pain due to her level of intoxication.   History provided by patient but limited due to intoxication.    Past Medical History:  Diagnosis Date   Alcohol  abuse    Allergy    Bipolar 1 disorder (HCC)    Depression    GERD (gastroesophageal reflux disease)    Neuromuscular disorder (HCC)    Pancreatitis 11/28/2019   approximated    Paranoid schizophrenia (HCC)    PTSD (post-traumatic stress disorder)    Reynolds syndrome [facility])     Past Surgical History:  Procedure Laterality Date   APPENDECTOMY     CESAREAN SECTION     ESOPHAGOGASTRODUODENOSCOPY N/A 10/17/2020   Procedure: ESOPHAGOGASTRODUODENOSCOPY (EGD);  Surgeon: Toledo, Ladell POUR, MD;  Location: [facility] ENDOSCOPY;  Service: Gastroenterology;  Laterality: N/A;   HERNIA REPAIR     PARTIAL HYSTERECTOMY     ovaries present not uterus- precancerous cells of uterus    ROUX-EN-Y GASTRIC BYPASS      MEDICATIONS:  Prior to Admission medications   Medication Sig Start Date End Date Taking? Authorizing Provider  albuterol  (VENTOLIN  HFA) 108 (90 Base) MCG/ACT inhaler Inhale 1-2 puffs into the lungs every 6 (six) hours as needed for wheezing or shortness of breath (excercise induced asthma). Patient not taking: Reported on 07/18/2022 09/05/21   Iloabachie, Chioma E, NP  baclofen  (LIORESAL ) 10 MG tablet Take 1  tablet (10 mg total) by mouth 3 (three) times daily. Patient not taking: Reported on 07/08/2022 02/14/22   Ostwalt, Janna, PA-C  cloNIDine  (CATAPRES ) 0.1 MG tablet TAKE 1 TABLET BY MOUTH DAILY Patient not taking: Reported on 07/08/2022 03/18/22   Ostwalt, Janna, PA-C  docusate sodium (COLACE) 100 MG capsule Take 100 mg by mouth 2 (two) times daily. Patient not taking: Reported on 07/08/2022    [provider]  fluticasone  (FLONASE ) 50 MCG/ACT nasal spray Place into both nostrils. Patient not taking: Reported on 07/08/2022 01/16/22   [provider]  gabapentin  (NEURONTIN ) 100 MG capsule Take 2 capsules (200 mg total) by mouth 2 (two) times daily. Patient not taking: Reported on 07/08/2022 02/14/22   Ostwalt, Janna, PA-C  hydrOXYzine  (VISTARIL ) 50 MG capsule Take 1 capsule (50 mg total) by mouth 3 (three) times daily. Patient not taking: Reported on 07/08/2022 02/14/22   Ostwalt, Janna, PA-C  naltrexone (DEPADE) 50 MG tablet Take 50 mg by mouth daily. Patient not taking: Reported on 07/08/2022 01/03/22   [provider]  pantoprazole  (PROTONIX ) 40 MG tablet Take 1 tablet (40 mg total) by mouth once daily. Patient not taking: Reported on 07/08/2022 02/14/22   Ostwalt, Janna, PA-C  prazosin  (MINIPRESS ) 1 MG capsule Take 1 mg by mouth at bedtime. Patient not taking: Reported on 07/08/2022 01/24/22   [provider]  propranolol  (INDERAL ) 10 MG tablet TAKE 1 TABLET(10 MG) BY  MOUTH THREE TIMES DAILY Patient not taking: Reported on 07/08/2022 03/18/22   Ostwalt, Janna, PA-C  QUEtiapine  (SEROQUEL ) 100 MG tablet TAKE 1 TABLET(100 MG) BY MOUTH AT BEDTIME Patient not taking: Reported on 07/08/2022 03/17/22   Ostwalt, Janna, PA-C  rOPINIRole  (REQUIP ) 0.5 MG tablet Take 0.5 mg by mouth at bedtime. Patient not taking: Reported on 07/08/2022 01/10/22   [provider]  SUMAtriptan  (IMITREX ) 50 MG tablet Take 1 tablet (50 mg total) by mouth once daily for 1 dose. May repeat in 2 hours if headache  persists or recurs. (Max of 2 doses in 24 hours). 09/05/21 09/24/21  Iloabachie, Chioma E, NP  venlafaxine  (EFFEXOR ) 37.5 MG tablet TAKE 1 TABLET(37.5 MG) BY MOUTH TWICE DAILY Patient not taking: Reported on 08/19/2023 05/18/23   Dineen Channel, PA-C    Physical Exam   Triage Vital Signs: ED Triage Vitals [05/30/24 2253]  Encounter Vitals Group     BP (!) 117/90     Girls Systolic BP Percentile      Girls Diastolic BP Percentile      Boys Systolic BP Percentile      Boys Diastolic BP Percentile      Pulse Rate 83     Resp 18     Temp 98.7 F (37.1 C)     Temp Source Oral     SpO2 94 %     Weight      Height      Head Circumference      Peak Flow      Pain Score      Pain Loc      Pain Education      Exclude from Growth Chart     Most recent vital signs: Vitals:   05/30/24 2253  BP: (!) 117/90  Pulse: 83  Resp: 18  Temp: 98.7 F (37.1 C)  SpO2: 94%     CONSTITUTIONAL: Drowsy but arousable to sternal rub.  Intoxicated and smells strongly of alcohol .  Disheveled in appearance.  Extremely slurred speech, difficult to understand. HEAD: Normocephalic; atraumatic EYES: Conjunctivae clear, PERRL, EOMI ENT: normal nose; no rhinorrhea; moist mucous membranes; pharynx without lesions noted; no dental injury; no septal hematoma, no epistaxis; no facial deformity or bony tenderness NECK: Supple, no midline spinal tenderness, step-off or deformity; trachea midline CARD: RRR; S1 and S2 appreciated; no murmurs, no clicks, no rubs, no gallops RESP: Normal chest excursion without splinting or tachypnea; breath sounds clear and equal bilaterally; no wheezes, no rhonchi, no rales; no hypoxia or respiratory distress CHEST:  chest wall stable, no crepitus or ecchymosis or deformity, nontender to palpation; no flail chest ABD/GI: Non-distended; soft, non-tender, no rebound, no guarding; no ecchymosis or other lesions noted PELVIS:  stable, nontender to palpation BACK:  The back appears  normal; no midline spinal tenderness, step-off or deformity EXT: Normal ROM in all joints; no edema; normal capillary refill; no cyanosis, no bony tenderness or bony deformity of patient's extremities, no joint effusions, compartments are soft, extremities are warm and well-perfused, no ecchymosis SKIN: Normal color for age and race; warm, abrasions scattered to her torso and extremities NEURO: No facial asymmetry, moving all extremities equally  ED Results / Procedures / Treatments   LABS: (all labs ordered are listed, but only abnormal results are displayed) Labs Reviewed  CBC WITH DIFFERENTIAL/PLATELET - Abnormal; Notable for the following components:      Result Value   Hemoglobin 10.9 (*)    HCT 32.2 (*)    MCV  78.2 (*)    RDW 19.2 (*)    Neutro Abs 1.5 (*)    Eosinophils Absolute 0.6 (*)    All other components within normal limits  COMPREHENSIVE METABOLIC PANEL WITH GFR - Abnormal; Notable for the following components:   CO2 21 (*)    Calcium 8.1 (*)    Total Protein 6.4 (*)    Albumin 3.4 (*)    All other components within normal limits  ETHANOL - Abnormal; Notable for the following components:   Alcohol , Ethyl (B) 370 (*)    All other components within normal limits  CBG MONITORING, ED - Abnormal; Notable for the following components:   Glucose-Capillary 103 (*)    All other components within normal limits  URINALYSIS, ROUTINE W REFLEX MICROSCOPIC  URINE DRUG SCREEN, QUALITATIVE ([facility] ONLY)     EKG:  EKG Interpretation Date/Time:    Ventricular Rate:    PR Interval:    QRS Duration:    QT Interval:    QTC Calculation:   R Axis:      Text Interpretation:            RADIOLOGY: My personal review and interpretation of imaging: CTs show no traumatic injury.  I have personally reviewed all radiology reports. CT HEAD WO CONTRAST ( ) Result Date: 05/31/2024 CLINICAL DATA:  History of falls with headaches and neck pain, initial encounter EXAM: CT HEAD  WITHOUT CONTRAST CT CERVICAL SPINE WITHOUT CONTRAST TECHNIQUE: Multidetector CT imaging of the head and cervical spine was performed following the standard protocol without intravenous contrast. Multiplanar CT image reconstructions of the cervical spine were also generated. RADIATION DOSE REDUCTION: This exam was performed according to the departmental dose-optimization program which includes automated exposure control, adjustment of the mA and/or kV according to patient size and/or use of iterative reconstruction technique. COMPARISON:  07/29/2022 FINDINGS: CT HEAD FINDINGS Brain: No evidence of acute infarction, hemorrhage, hydrocephalus, extra-axial collection or mass lesion/mass effect. Vascular: No hyperdense vessel or unexpected calcification. Skull: Normal. Negative for fracture or focal lesion. Sinuses/Orbits: No acute finding. Other: None. CT CERVICAL SPINE FINDINGS Alignment: Straightening of the normal cervical lordosis is noted. Skull base and vertebrae: 7 cervical segments are well visualized. Vertebral body height is well maintained. Mild osteophytic changes are noted at C6-7. Mild facet hypertrophic changes are seen. No acute fracture or acute facet abnormality is noted. The odontoid is within normal limits. Soft tissues and spinal canal: Surrounding soft tissue structures are within normal limits. Upper chest: Visualized lung apices are unremarkable. Other: None IMPRESSION: CT of the head: No acute intracranial abnormality noted. CT of the cervical spine: No acute abnormality noted. Mild degenerative changes are noted. Electronically Signed   By: Oneil Devonshire M.D.   On: 05/31/2024 01:10   CT Cervical Spine Wo Contrast Result Date: 05/31/2024 CLINICAL DATA:  History of falls with headaches and neck pain, initial encounter EXAM: CT HEAD WITHOUT CONTRAST CT CERVICAL SPINE WITHOUT CONTRAST TECHNIQUE: Multidetector CT imaging of the head and cervical spine was performed following the standard protocol  without intravenous contrast. Multiplanar CT image reconstructions of the cervical spine were also generated. RADIATION DOSE REDUCTION: This exam was performed according to the departmental dose-optimization program which includes automated exposure control, adjustment of the mA and/or kV according to patient size and/or use of iterative reconstruction technique. COMPARISON:  07/29/2022 FINDINGS: CT HEAD FINDINGS Brain: No evidence of acute infarction, hemorrhage, hydrocephalus, extra-axial collection or mass lesion/mass effect. Vascular: No hyperdense vessel or unexpected calcification. Skull: Normal.  Negative for fracture or focal lesion. Sinuses/Orbits: No acute finding. Other: None. CT CERVICAL SPINE FINDINGS Alignment: Straightening of the normal cervical lordosis is noted. Skull base and vertebrae: 7 cervical segments are well visualized. Vertebral body height is well maintained. Mild osteophytic changes are noted at C6-7. Mild facet hypertrophic changes are seen. No acute fracture or acute facet abnormality is noted. The odontoid is within normal limits. Soft tissues and spinal canal: Surrounding soft tissue structures are within normal limits. Upper chest: Visualized lung apices are unremarkable. Other: None IMPRESSION: CT of the head: No acute intracranial abnormality noted. CT of the cervical spine: No acute abnormality noted. Mild degenerative changes are noted. Electronically Signed   By: Oneil Devonshire M.D.   On: 05/31/2024 01:10     PROCEDURES:  Critical Care performed: No     Procedures    IMPRESSION / MDM / ASSESSMENT AND PLAN / ED COURSE  I reviewed the triage vital signs and the nursing notes.  Patient here with alcohol  intoxication and reports of a fall.     DIFFERENTIAL DIAGNOSIS (includes but not limited to):   Alcohol  intoxication, intracranial hemorrhage, skull fracture, cervical spine fracture  Patient's presentation is most consistent with acute presentation with  potential threat to life or bodily function.  PLAN: Will obtain labs, CT head and cervical spine.  Will give IV fluids.  Blood glucose currently normal.   MEDICATIONS GIVEN IN ED: Medications  sodium chloride  0.9 % bolus 1,000 mL (0 mLs Intravenous Stopped 05/31/24 0146)     ED COURSE: CT scans reviewed and interpreted by myself and the radiologist and are unremarkable.  Hemoglobin of 10.9 which appears to be her baseline.  Normal electrolytes, creatinine, LFTs.  Ethanol level of 370.   4:43 AM  Pt's speech has improved.  She has no complaints at this time.  She ambulates without assistance.  Tolerating p.o.  She is requesting discharge.   At this time, I do not feel there is any life-threatening condition present. I reviewed all nursing notes, vitals, pertinent previous records.  All lab and urine results, EKGs, imaging ordered have been independently reviewed and interpreted by myself.  I reviewed all available radiology reports from any imaging ordered this visit.  Based on my assessment, I feel the patient is safe to be discharged home without further emergent workup and can continue workup as an outpatient as needed. Discussed all findings, treatment plan as well as usual and customary return precautions.  They verbalize understanding and are comfortable with this plan.  Outpatient follow-up has been provided as needed.  All questions have been answered.   CONSULTS:  none   OUTSIDE RECORDS REVIEWED: Reviewed last internal medicine note on 01/22/2024.       FINAL CLINICAL IMPRESSION(S) / ED DIAGNOSES   Final diagnoses:  Alcoholic intoxication without complication (HCC)  Fall, initial encounter     Rx / DC Orders   ED Discharge Orders     None        Note:  This document was prepared using Dragon voice recognition software and may include unintentional dictation errors.   Angeles Paolucci, Josette SAILOR, DO 05/31/24 [phone]
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
## 2024-05-31 ENCOUNTER — Emergency Department: Payer: MEDICAID

## 2024-05-31 LAB — CBC WITH DIFFERENTIAL/PLATELET
Abs Immature Granulocytes: 0.01 10*3/uL (ref 0.00–0.07)
Basophils Absolute: 0 10*3/uL (ref 0.0–0.1)
Basophils Relative: 1 %
Eosinophils Absolute: 0.6 10*3/uL — ABNORMAL HIGH (ref 0.0–0.5)
Eosinophils Relative: 14 %
HCT: 32.2 % — ABNORMAL LOW (ref 36.0–46.0)
Hemoglobin: 10.9 g/dL — ABNORMAL LOW (ref 12.0–15.0)
Immature Granulocytes: 0 %
Lymphocytes Relative: 35 %
Lymphs Abs: 1.4 10*3/uL (ref 0.7–4.0)
MCH: 26.5 pg (ref 26.0–34.0)
MCHC: 33.9 g/dL (ref 30.0–36.0)
MCV: 78.2 fL — ABNORMAL LOW (ref 80.0–100.0)
Monocytes Absolute: 0.4 10*3/uL (ref 0.1–1.0)
Monocytes Relative: 11 %
Neutro Abs: 1.5 10*3/uL — ABNORMAL LOW (ref 1.7–7.7)
Neutrophils Relative %: 39 %
Platelets: 207 10*3/uL (ref 150–400)
RBC: 4.12 MIL/uL (ref 3.87–5.11)
RDW: 19.2 % — ABNORMAL HIGH (ref 11.5–15.5)
WBC: 4 10*3/uL (ref 4.0–10.5)
nRBC: 0 % (ref 0.0–0.2)

## 2024-05-31 LAB — COMPREHENSIVE METABOLIC PANEL WITH GFR
ALT: 26 U/L (ref 0–44)
AST: 30 U/L (ref 15–41)
Albumin: 3.4 g/dL — ABNORMAL LOW (ref 3.5–5.0)
Alkaline Phosphatase: 84 U/L (ref 38–126)
Anion gap: 12 (ref 5–15)
BUN: 7 mg/dL (ref 6–20)
CO2: 21 mmol/L — ABNORMAL LOW (ref 22–32)
Calcium: 8.1 mg/dL — ABNORMAL LOW (ref 8.9–10.3)
Chloride: 103 mmol/L (ref 98–111)
Creatinine, Ser: 0.53 mg/dL (ref 0.44–1.00)
GFR, Estimated: >60 mL/min (ref >60)
Glucose, Bld: 93 mg/dL (ref 70–99)
Potassium: 3.6 mmol/L (ref 3.5–5.1)
Sodium: 136 mmol/L (ref 135–145)
Total Bilirubin: <0.2 mg/dL (ref 0.0–1.2)
Total Protein: 6.4 g/dL — ABNORMAL LOW (ref 6.5–8.1)

## 2024-05-31 LAB — CBG MONITORING, ED: Glucose-Capillary: 103 mg/dL — ABNORMAL HIGH (ref 70–99)

## 2024-05-31 LAB — ETHANOL: Alcohol, Ethyl (B): 370 mg/dL (ref <15)

## 2024-05-31 MED ORDER — SODIUM CHLORIDE 0.9 % IV BOLUS (SEPSIS)
1000.0000 mL | Freq: Once | INTRAVENOUS | Status: AC
  Administered 2024-05-31: 1000 mL via INTRAVENOUS

## 2024-05-31 NOTE — ED Notes (Signed)
 Pt requesting discharge, pt ambulated to and from restroom without assistance. Pt given paper scrubs to change into.

## 2024-06-03 ENCOUNTER — Other Ambulatory Visit: Payer: Self-pay

## 2024-06-03 DIAGNOSIS — Z5329 Procedure and treatment not carried out because of patient's decision for other reasons: Secondary | ICD-10-CM | POA: Diagnosis not present

## 2024-06-03 DIAGNOSIS — F10120 Alcohol abuse with intoxication, uncomplicated: Secondary | ICD-10-CM | POA: Diagnosis not present

## 2024-06-03 DIAGNOSIS — F1010 Alcohol abuse, uncomplicated: Secondary | ICD-10-CM | POA: Diagnosis not present

## 2024-06-03 DIAGNOSIS — Y909 Presence of alcohol in blood, level not specified: Secondary | ICD-10-CM | POA: Insufficient documentation

## 2024-06-03 DIAGNOSIS — R1011 Right upper quadrant pain: Secondary | ICD-10-CM | POA: Insufficient documentation

## 2024-06-03 DIAGNOSIS — Z5321 Procedure and treatment not carried out due to patient leaving prior to being seen by health care provider: Secondary | ICD-10-CM | POA: Diagnosis not present

## 2024-06-03 DIAGNOSIS — Y908 Blood alcohol level of 240 mg/100 ml or more: Secondary | ICD-10-CM | POA: Diagnosis not present

## 2024-06-03 DIAGNOSIS — R1010 Upper abdominal pain, unspecified: Secondary | ICD-10-CM | POA: Diagnosis present

## 2024-06-03 LAB — CBC
HCT: 36.2 % (ref 36.0–46.0)
Hemoglobin: 12.1 g/dL (ref 12.0–15.0)
MCH: 26 pg (ref 26.0–34.0)
MCHC: 33.4 g/dL (ref 30.0–36.0)
MCV: 77.8 fL — ABNORMAL LOW (ref 80.0–100.0)
Platelets: 274 10*3/uL (ref 150–400)
RBC: 4.65 MIL/uL (ref 3.87–5.11)
RDW: 19.3 % — ABNORMAL HIGH (ref 11.5–15.5)
WBC: 6.3 10*3/uL (ref 4.0–10.5)
nRBC: 0 % (ref 0.0–0.2)

## 2024-06-03 NOTE — ED Triage Notes (Addendum)
 Patient C/O right upper abdominal pain that began yesterday. Patient states that she has pancreatitis. 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 Patient is likely intoxicated, so history is difficult to obtain at this time.
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
## 2024-06-04 ENCOUNTER — Other Ambulatory Visit: Payer: Self-pay

## 2024-06-04 ENCOUNTER — Emergency Department
Admission: EM | Admit: 2024-06-04 | Discharge: 2024-06-04 | Disposition: A | Discharge status: Home / Self Care | Payer: MEDICAID | Attending: Emergency Medicine | Admitting: Emergency Medicine

## 2024-06-04 ENCOUNTER — Emergency Department
Admission: EM | Admit: 2024-06-04 | Discharge: 2024-06-04 | Discharge status: Against Medical Advice | Payer: MEDICAID | Attending: Emergency Medicine | Admitting: Emergency Medicine

## 2024-06-04 DIAGNOSIS — R1010 Upper abdominal pain, unspecified: Secondary | ICD-10-CM | POA: Insufficient documentation

## 2024-06-04 DIAGNOSIS — Z5321 Procedure and treatment not carried out due to patient leaving prior to being seen by health care provider: Secondary | ICD-10-CM

## 2024-06-04 DIAGNOSIS — F10920 Alcohol use, unspecified with intoxication, uncomplicated: Secondary | ICD-10-CM

## 2024-06-04 DIAGNOSIS — Y908 Blood alcohol level of 240 mg/100 ml or more: Secondary | ICD-10-CM | POA: Insufficient documentation

## 2024-06-04 DIAGNOSIS — Z5329 Procedure and treatment not carried out because of patient's decision for other reasons: Secondary | ICD-10-CM | POA: Insufficient documentation

## 2024-06-04 DIAGNOSIS — F10120 Alcohol abuse with intoxication, uncomplicated: Secondary | ICD-10-CM | POA: Insufficient documentation

## 2024-06-04 LAB — URINE DRUG SCREEN, QUALITATIVE (ARMC ONLY)
Amphetamines, Ur Screen: NOT DETECTED
Barbiturates, Ur Screen: NOT DETECTED
Benzodiazepine, Ur Scrn: NOT DETECTED
Cannabinoid 50 Ng, Ur ~~LOC~~: NOT DETECTED
Cocaine Metabolite,Ur ~~LOC~~: NOT DETECTED
MDMA (Ecstasy)Ur Screen: NOT DETECTED
Methadone Scn, Ur: NOT DETECTED
Opiate, Ur Screen: NOT DETECTED
Phencyclidine (PCP) Ur S: NOT DETECTED
Tricyclic, Ur Screen: NOT DETECTED

## 2024-06-04 LAB — URINALYSIS, ROUTINE W REFLEX MICROSCOPIC
Bacteria, UA: NONE SEEN
Bilirubin Urine: NEGATIVE
Bilirubin Urine: NEGATIVE
Glucose, UA: NEGATIVE mg/dL
Glucose, UA: NEGATIVE mg/dL
Hgb urine dipstick: NEGATIVE
Hgb urine dipstick: NEGATIVE
Ketones, ur: NEGATIVE mg/dL
Ketones, ur: NEGATIVE mg/dL
Leukocytes,Ua: NEGATIVE
Nitrite: POSITIVE — AB
Nitrite: POSITIVE — AB
Protein, ur: NEGATIVE mg/dL
Protein, ur: NEGATIVE mg/dL
Specific Gravity, Urine: 1.003 — ABNORMAL LOW (ref 1.005–1.030)
Specific Gravity, Urine: 1.01 (ref 1.005–1.030)
pH: 5 (ref 5.0–8.0)
pH: 7 (ref 5.0–8.0)

## 2024-06-04 LAB — CBC
HCT: 36.5 % (ref 36.0–46.0)
Hemoglobin: 12.3 g/dL (ref 12.0–15.0)
MCH: 25.9 pg — ABNORMAL LOW (ref 26.0–34.0)
MCHC: 33.7 g/dL (ref 30.0–36.0)
MCV: 77 fL — ABNORMAL LOW (ref 80.0–100.0)
Platelets: 244 10*3/uL (ref 150–400)
RBC: 4.74 MIL/uL (ref 3.87–5.11)
RDW: 19.2 % — ABNORMAL HIGH (ref 11.5–15.5)
WBC: 5 10*3/uL (ref 4.0–10.5)
nRBC: 0 % (ref 0.0–0.2)

## 2024-06-04 LAB — COMPREHENSIVE METABOLIC PANEL WITH GFR
ALT: 23 U/L (ref 0–44)
ALT: 24 U/L (ref 0–44)
AST: 38 U/L (ref 15–41)
AST: 44 U/L — ABNORMAL HIGH (ref 15–41)
Albumin: 3.8 g/dL (ref 3.5–5.0)
Albumin: 3.8 g/dL (ref 3.5–5.0)
Alkaline Phosphatase: 112 U/L (ref 38–126)
Alkaline Phosphatase: 94 U/L (ref 38–126)
Anion gap: 11 (ref 5–15)
Anion gap: 11 (ref 5–15)
BUN: 10 mg/dL (ref 6–20)
BUN: 11 mg/dL (ref 6–20)
CO2: 22 mmol/L (ref 22–32)
CO2: 22 mmol/L (ref 22–32)
Calcium: 8.3 mg/dL — ABNORMAL LOW (ref 8.9–10.3)
Calcium: 8.4 mg/dL — ABNORMAL LOW (ref 8.9–10.3)
Chloride: 100 mmol/L (ref 98–111)
Chloride: 105 mmol/L (ref 98–111)
Creatinine, Ser: 0.51 mg/dL (ref 0.44–1.00)
Creatinine, Ser: 0.57 mg/dL (ref 0.44–1.00)
GFR, Estimated: >60 mL/min (ref >60)
GFR, Estimated: >60 mL/min (ref >60)
Glucose, Bld: 102 mg/dL — ABNORMAL HIGH (ref 70–99)
Glucose, Bld: 96 mg/dL (ref 70–99)
Potassium: 4 mmol/L (ref 3.5–5.1)
Potassium: 4 mmol/L (ref 3.5–5.1)
Sodium: 133 mmol/L — ABNORMAL LOW (ref 135–145)
Sodium: 138 mmol/L (ref 135–145)
Total Bilirubin: 0.4 mg/dL (ref 0.0–1.2)
Total Bilirubin: 0.5 mg/dL (ref 0.0–1.2)
Total Protein: 7.7 g/dL (ref 6.5–8.1)
Total Protein: 7.8 g/dL (ref 6.5–8.1)

## 2024-06-04 LAB — ETHANOL: Alcohol, Ethyl (B): 429 mg/dL (ref <15)

## 2024-06-04 LAB — LIPASE, BLOOD
Lipase: 37 U/L (ref 11–51)
Lipase: 46 U/L (ref 11–51)

## 2024-06-04 LAB — PREGNANCY, URINE: Preg Test, Ur: NEGATIVE

## 2024-06-04 MED ORDER — FOSFOMYCIN TROMETHAMINE 3 G PO PACK
3.0000 g | PACK | Freq: Once | ORAL | Status: AC
  Administered 2024-06-04: 3 g via ORAL
  Filled 2024-06-04: qty 3

## 2024-06-04 MED ORDER — ALUM & MAG HYDROXIDE-SIMETH 200-200-20 MG/5ML PO SUSP
30.0000 mL | Freq: Once | ORAL | Status: AC
  Administered 2024-06-04: 30 mL via ORAL
  Filled 2024-06-04: qty 30

## 2024-06-04 MED ORDER — LIDOCAINE VISCOUS HCL 2 % MT SOLN
15.0000 mL | Freq: Once | OROMUCOSAL | Status: AC
  Administered 2024-06-04: 15 mL via ORAL
  Filled 2024-06-04: qty 15
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
## 2024-06-04 NOTE — ED Notes (Signed)
 Representative from [facility] here to see pt. Representative reports pt contacted agency herself. This RN went to pt's room to ask pt if she wished to speak with agency representative . Pt stating yes and representative brought to room.

## 2024-06-04 NOTE — ED Notes (Signed)
 Dr. Waymond aware of patient's Ethanol level of 429.

## 2024-06-04 NOTE — ED Notes (Addendum)
 Patient was given the ED phone to call her husband who has left. Patient is belligerent since she can't get a hold of anyone. This writer attempted to call her husband on the emergency contact phone, but no answer. Patient is agitated and walked out of ED. Dr. Waymond aware and went to the lobby to see the patient and First Nurse is aware.

## 2024-06-04 NOTE — ED Notes (Signed)
 Pt ambulatory in lobby, continues to swear, ambulating with steady gait no distress noted. Pt states she is leaving, observed pt walk out front lobby doors.
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
## 2024-06-04 NOTE — ED Notes (Signed)
 Officer Myrna is present also in the hallway with Officer Sanborn. Patient is not in custody.

## 2024-06-04 NOTE — ED Notes (Signed)
 SANE RN was unable to perform exam due to patient's drowsiness and ethanol level. Dr. Waymond aware. [employer] Officers aware and have left the ED.

## 2024-06-04 NOTE — ED Notes (Signed)
 Patient was assisted to hallway bathroom via wheelchair due to patient being unsteady on her feet. Patient was verbal, constantly talking, but was appropriate with this Clinical research associate. Patient was given a blanket for comfort. Patient states GI cocktail brought her pain from a 10/10 to 7/10, but then asked for Dilaudid . Dr. Waymond informed.  Patient states she drank 3 beers last night. Patient appears intoxicated, but is cooperative at this time.

## 2024-06-04 NOTE — ED Notes (Signed)
 Patient's husband is in the lobby. Officer Sanborn and Dr. Tan aware and are agreeable to the husband being at bedside. Patient is sleeping at this time and will ask her permission for the husband to be at bedside when she awakes.
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
## 2024-06-04 NOTE — ED Notes (Signed)
 Arlyne RN SANE RN at bedside.
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
## 2024-06-04 NOTE — ED Notes (Signed)
 [facility]  has left. Patient asked for a sandwich tray and Dr. Waymond agreed. No sandwich trays are in the ED. Patient is upset. Patient was given a ginger ale and will give crackers.

## 2024-06-04 NOTE — ED Notes (Signed)
 Patient's husband came to the ED and was told she left and is going to find her. Husband was given a brief description of her demeanor before he left. Dr. Waymond aware.
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
## 2024-08-14 ENCOUNTER — Other Ambulatory Visit: Payer: Self-pay

## 2024-08-14 ENCOUNTER — Emergency Department
Admission: EM | Admit: 2024-08-14 | Discharge: 2024-08-14 | Disposition: A | Discharge status: Home / Self Care | Payer: MEDICAID | Attending: Emergency Medicine | Admitting: Emergency Medicine

## 2024-08-14 DIAGNOSIS — R1010 Upper abdominal pain, unspecified: Secondary | ICD-10-CM | POA: Diagnosis present

## 2024-08-14 DIAGNOSIS — K2920 Alcoholic gastritis without bleeding: Secondary | ICD-10-CM | POA: Diagnosis not present

## 2024-08-14 LAB — COMPREHENSIVE METABOLIC PANEL WITH GFR
ALT: 21 U/L (ref 0–44)
AST: 33 U/L (ref 15–41)
Albumin: 3.6 g/dL (ref 3.5–5.0)
Alkaline Phosphatase: 90 U/L (ref 38–126)
Anion gap: 9 (ref 5–15)
BUN: 18 mg/dL (ref 6–20)
CO2: 23 mmol/L (ref 22–32)
Calcium: 8.5 mg/dL — ABNORMAL LOW (ref 8.9–10.3)
Chloride: 100 mmol/L (ref 98–111)
Creatinine, Ser: 0.59 mg/dL (ref 0.44–1.00)
GFR, Estimated: >60 mL/min (ref >60)
Glucose, Bld: 93 mg/dL (ref 70–99)
Potassium: 4.3 mmol/L (ref 3.5–5.1)
Sodium: 132 mmol/L — ABNORMAL LOW (ref 135–145)
Total Bilirubin: 0.4 mg/dL (ref 0.0–1.2)
Total Protein: 7.4 g/dL (ref 6.5–8.1)

## 2024-08-14 LAB — URINALYSIS, ROUTINE W REFLEX MICROSCOPIC
Bilirubin Urine: NEGATIVE
Glucose, UA: NEGATIVE mg/dL
Hgb urine dipstick: NEGATIVE
Ketones, ur: NEGATIVE mg/dL
Leukocytes,Ua: NEGATIVE
Nitrite: NEGATIVE
Protein, ur: NEGATIVE mg/dL
Specific Gravity, Urine: 1.005 (ref 1.005–1.030)
pH: 5 (ref 5.0–8.0)

## 2024-08-14 LAB — CBC
HCT: 36 % (ref 36.0–46.0)
Hemoglobin: 12 g/dL (ref 12.0–15.0)
MCH: 26 pg (ref 26.0–34.0)
MCHC: 33.3 g/dL (ref 30.0–36.0)
MCV: 78.1 fL — ABNORMAL LOW (ref 80.0–100.0)
Platelets: 373 K/uL (ref 150–400)
RBC: 4.61 MIL/uL (ref 3.87–5.11)
RDW: 18.5 % — ABNORMAL HIGH (ref 11.5–15.5)
WBC: 6.7 K/uL (ref 4.0–10.5)
nRBC: 0 % (ref 0.0–0.2)

## 2024-08-14 LAB — LIPASE, BLOOD: Lipase: 34 U/L (ref 11–51)

## 2024-08-14 MED ORDER — SODIUM CHLORIDE 0.9 % IV BOLUS: 1000.0000 mL | Freq: Once | INTRAVENOUS | Status: DC

## 2024-08-14 MED ORDER — ONDANSETRON 4 MG PO TBDP
4.0000 mg | ORAL_TABLET | Freq: Three times a day (TID) | ORAL | 0 refills | Status: AC | PRN
  Filled 2024-08-14: qty 20, 7d supply, fill #0

## 2024-08-14 MED ORDER — ONDANSETRON HCL 4 MG/2ML IJ SOLN: 4.0000 mg | Freq: Once | INTRAMUSCULAR | Status: DC

## 2024-08-14 MED ORDER — ONDANSETRON 4 MG PO TBDP
8.0000 mg | ORAL_TABLET | Freq: Once | ORAL | Status: AC
  Administered 2024-08-14: 8 mg via ORAL
  Filled 2024-08-14: qty 2

## 2024-08-14 MED ORDER — PANTOPRAZOLE SODIUM 40 MG IV SOLR: 40.0000 mg | Freq: Once | INTRAVENOUS | Status: DC

## 2024-08-14 MED ORDER — KETOROLAC TROMETHAMINE 15 MG/ML IJ SOLN: 15.0000 mg | Freq: Once | INTRAMUSCULAR | Status: DC

## 2024-08-14 NOTE — ED Notes (Signed)
 Pt stated stomach feels a lot better after Zofran .  Pt able to keep crackers and water down.

## 2024-08-14 NOTE — ED Provider Notes (Signed)
 [facility] Provider Note    Event Date/Time   First MD Initiated Contact with Patient 08/14/24 1916     (approximate)   History   Chief Complaint: Abdominal Pain   HPI  Katrina Turner is a 43 y.o. female with a history of alcohol  abuse, bipolar disorder who comes ED complaining of upper abdominal pain that feels like prior episodes of pancreatitis.  Does report drinking alcohol  today.  Vomiting earlier today as well.  No diarrhea, no black or bloody stool or hematemesis.  Request crackers.        Past Medical History:  Diagnosis Date   Alcohol  abuse    Allergy    Bipolar 1 disorder (HCC)    Depression    GERD (gastroesophageal reflux disease)    Neuromuscular disorder (HCC)    Pancreatitis 11/28/2019   approximated    Paranoid schizophrenia (HCC)    PTSD (post-traumatic stress disorder)    Reynolds syndrome [facility])     Current Outpatient Rx   Order #: [id number] Class: Normal   Order #: [id number] Class: Normal   Order #: [id number] Class: Normal   Order #: [id number] Class: Normal   Order #: [id number] Class: Historical Med   Order #: [id number] Class: Historical Med   Order #: [id number] Class: Normal   Order #: [id number] Class: Normal   Order #: [id number] Class: Historical Med   Order #: [id number] Class: Normal   Order #: [id number] Class: Historical Med   Order #: [id number] Class: Normal   Order #: [id number] Class: Normal   Order #: [id number] Class: Historical Med   Order #: [id number] Class: Normal   Order #: [id number] Class: Normal    Past Surgical History:  Procedure Laterality Date   APPENDECTOMY     CESAREAN SECTION     ESOPHAGOGASTRODUODENOSCOPY N/A 10/17/2020   Procedure: ESOPHAGOGASTRODUODENOSCOPY (EGD);  Surgeon: Toledo, Ladell POUR, MD;  Location: [facility] ENDOSCOPY;  Service: Gastroenterology;  Laterality: N/A;   HERNIA REPAIR     PARTIAL HYSTERECTOMY     ovaries present not uterus- precancerous cells of uterus    ROUX-EN-Y GASTRIC BYPASS       Physical Exam   Triage Vital Signs: ED Triage Vitals  Encounter Vitals Group     BP 08/14/24 1715 125/75     Girls Systolic BP Percentile --      Girls Diastolic BP Percentile --      Boys Systolic BP Percentile --      Boys Diastolic BP Percentile --      Pulse Rate 08/14/24 1715 66     Resp 08/14/24 1715 18     Temp 08/14/24 1715 98.1 F (36.7 C)     Temp Source 08/14/24 1715 Oral     SpO2 08/14/24 1715 97 %     Weight 08/14/24 1714 150 lb (68 kg)     Height 08/14/24 1714 5' 7 (1.702 m)     Head Circumference --      Peak Flow --      Pain Score 08/14/24 1714 10     Pain Loc --      Pain Education --      Exclude from Growth Chart --     Most recent vital signs: Vitals:   08/14/24 1715 08/14/24 2000  BP: 125/75 (!) 136/94  Pulse: 66 (!) 57  Resp: 18 19  Temp: 98.1 F (36.7 C)   SpO2: 97% 100%    General: Awake, no distress.  Intoxicated, slurring speech with alcohol  on breath CV:  Good peripheral perfusion.  Regular rate and  Resp:  Normal effort.  Clear lungs Abd:  No distention.  Soft with mild upper abdominal tenderness Other:  Moist oral mucosa   ED Results / Procedures / Treatments   Labs (all labs ordered are listed, but only abnormal results are displayed) Labs Reviewed  COMPREHENSIVE METABOLIC PANEL WITH GFR - Abnormal; Notable for the following components:      Result Value   Sodium 132 (*)    Calcium 8.5 (*)    All other components within normal limits  CBC - Abnormal; Notable for the following components:   MCV 78.1 (*)    RDW 18.5 (*)    All other components within normal limits  URINALYSIS, ROUTINE W REFLEX MICROSCOPIC - Abnormal; Notable for the following components:   Color, Urine STRAW (*)    APPearance CLEAR (*)    All other components within normal limits  LIPASE, BLOOD  ETHANOL     EKG    RADIOLOGY    PROCEDURES:  Procedures   MEDICATIONS ORDERED IN ED: Medications  ondansetron  (ZOFRAN -ODT) disintegrating  tablet 8 mg (8 mg Oral Given 08/14/24 2003)     IMPRESSION / MDM / ASSESSMENT AND PLAN / ED COURSE  I reviewed the triage vital signs and the nursing notes.  DDx: Pancreatitis, electrolyte derangement, AKI, UTI, alcohol  intoxication.  Likely gastritis  Patient's presentation is most consistent with acute presentation with potential threat to life or bodily function.  Patient presents with upper abdominal pain and vomiting after heavy alcohol  intake.  Labs are normal.  She asked for food currently, will give oral Zofran  and p.o. trial.  She is with a responsible adult friend who she can be discharged with.   Clinical Course as of 08/14/24 2215  [place] Aug 14, 2024  2104 Symptoms resolved with Zofran .  Tolerating p.o., stable for discharge [PS]    Clinical Course User Index [PS] Viviann Pastor, MD     FINAL CLINICAL IMPRESSION(S) / ED DIAGNOSES   Final diagnoses:  Acute alcoholic gastritis without hemorrhage     Rx / DC Orders   ED Discharge Orders          Ordered    ondansetron  (ZOFRAN -ODT) 4 MG disintegrating tablet  Every 8 hours PRN        08/14/24 2103             Note:  This document was prepared using Dragon voice recognition software and may include unintentional dictation errors.   Viviann Pastor, MD 08/14/24 2215

## 2024-08-14 NOTE — ED Triage Notes (Signed)
 Patient states lower abdominal pain, vomiting and burning with urination for one week. Reports drinking ETOH today.

## 2024-08-14 NOTE — ED Notes (Signed)
 Pt is complaining of right sided pain under breast and down in to abdomen.  Pt stated she had RNY reversed due to pancreatic issues.  Pt states she has had prior issues with her gallbladder.  Also Pt states she did drink earlier today.  I asked Pt and man with her is the behavior, speech and movements normal for her.  They both said yes. She said she has been clean for 3 years and was on lithium and ever since then she has had trouble with speaking.

## 2024-08-15 ENCOUNTER — Other Ambulatory Visit: Payer: Self-pay

## 2024-10-29 NOTE — Progress Notes (Signed)
 Chief Complaint: Chief Complaint  Patient presents with   Laceration    Cut on right hand, pointer finger, this happened about 6:00pm 10/28/24. Pt reports opening a can, the can opener slipped. Not currently bleeding. Reports pain. Unsure of last Tdap.   Breast Problem    Pt reports concern of her left breast. Pt states there is a knot just under her nipple. Pt reports some drainage, states the area is painful. Has been red and warm to the touch. Currently using warm compresses.    Cough    Cough started 2 days ago, some sinus drainage. Pt had body aches, chills and fever the first day. Pt denies nausea, vomiting diarrhea.     Katrina Turner is a 43 y.o. female who presents today for multiple medical complaints consisting of a cut on the radial aspect of her right index finger MCP region.  She states this happened yesterday around 6 PM.  Cut it with a can opener.  She cleansed it and wrapped it with tape/no tape.  She denies any numbness or tingling.  Bleeding has been well-controlled.  Tetanus is unknown.  Chronic wound left breast, no drainage recently but has had some drainage in the past.  Swelling and redness has improved.  She had a mammogram that was negative.  She has not followed up with PCP.  She denies any fevers or chills.  Cough 2 days ago, mild runny nose and congestion.  No fevers or chills nausea vomiting or diarrhea.  No wheezing, chest pain shortness of breath    Past Medical History: Past Medical History:  Diagnosis Date   Alcohol  abuse    Allergy    Bipolar 1 disorder (CMS/HHS-HCC)    Chronic pancreatitis (CMS/HHS-HCC)    Hypertension    Migraine headache    Raynaud disease    Raynaud's disease without gangrene    Systemic lupus erythematosus (CMS/HHS-HCC)     Past Surgical History: Past Surgical History:  Procedure Laterality Date   EGD  10/17/2020   Normal EGD/Repeat as needed/TKT   APPENDECTOMY     c sesction     ERCP     GASTRIC  BYPASS OPEN     INGUINAL HERNIA REPAIR     TONSILLECTOMY      Past Family History: Family History  Problem Relation Age of Onset   Gout Father    Diabetes Father    Cancer Maternal Grandmother    Gout Paternal Grandmother    Arthritis Paternal Grandmother    Cancer Paternal Grandmother    Dementia Paternal Grandmother    Diabetes Paternal Grandmother    Gout Paternal Grandfather    Arthritis Paternal Grandfather    Cancer Paternal Grandfather    Diabetes Paternal Grandfather     Medications: Current Outpatient Medications  Medication Sig Dispense Refill   multivitamin tablet Take 1 tablet by mouth once daily     albuterol  90 mcg/actuation inhaler Inhale 2 inhalations into the lungs (Patient not taking: Reported on 10/29/2024)     cefdinir (OMNICEF) 300 mg capsule Take 300 mg by mouth 2 (two) times daily For UTI (Patient not taking: Reported on 10/29/2024)     cetirizine  (ZYRTEC ) 10 MG tablet Take 10 mg by mouth once daily (Patient not taking: Reported on 10/29/2024)     diphenhydrAMINE  (BENADRYL ) 12.5 mg chewable tablet Take 12.5 mg by mouth 4 (four) times daily as needed for Allergies (Patient not taking: Reported on 10/29/2024)     doxycycline (VIBRA-TABS) 100 MG tablet  Take 1 tablet (100 mg total) by mouth 2 (two) times daily for 10 days 20 tablet 0   ferrous sulfate 325 (65 FE) MG tablet Take 325 mg by mouth daily with breakfast (Patient not taking: Reported on 10/29/2024)     fluticasone  propionate (FLONASE ) 50 mcg/actuation nasal spray  (Patient not taking: Reported on 10/29/2024)     MAGNESIUM  CITRATE ORAL Take by mouth once daily (Patient not taking: Reported on 10/29/2024)     miscellaneous medical supply Misc [place] (Patient not taking: Reported on 10/29/2024) 1 each 0   naltrexone (REVIA) 50 mg tablet  (Patient not taking: Reported on 10/29/2024)     naproxen sodium (ALEVE) 220 MG tablet Take 220 mg by mouth 2 (two) times daily with meals  (Patient not taking: Reported on 10/29/2024)     omega-3/dha/epa/dpa/fish oil (OMEGA-3 2100 ORAL) Take by mouth (Patient not taking: Reported on 10/29/2024)     PARoxetine (PAXIL) 20 MG tablet  (Patient not taking: Reported on 10/29/2024)     prazosin  (MINIPRESS ) 1 MG capsule  (Patient not taking: Reported on 10/29/2024)     QUEtiapine  (SEROQUEL ) 100 MG tablet  (Patient not taking: Reported on 10/29/2024)     rOPINIRole  (REQUIP ) 0.5 MG tablet  (Patient not taking: Reported on 10/29/2024)     SUMAtriptan  (IMITREX ) 25 MG tablet Take 25 mg by mouth as directed for Migraine May take a second dose after 2 hours if needed. (Patient not taking: Reported on 10/29/2024)     UNABLE TO FIND once daily Med Name: Theraworx foam pain relief cream (Patient not taking: Reported on 10/29/2024)     venlafaxine  (EFFEXOR ) 37.5 MG tablet Take 1 tablet (37.5 mg total) by mouth 2 (two) times daily START TAKING 1 TABLET BY MOUTH DAILY FOR 1 WEEK, THEN INCREASE TO 1 TABLET BY MOUTH TWICE DAILY FOR HEADACHES (Patient not taking: Reported on 10/29/2024) 60 tablet 0   vitamin B complex (B COMPLEX 1 ORAL) Take by mouth (Patient not taking: Reported on 10/29/2024)     zinc sulfate (ZINC-15 ORAL) Take by mouth (Patient not taking: Reported on 10/29/2024)     No current facility-administered medications for this visit.    Allergies: Allergies  Allergen Reactions   Other Swelling    Throat swelling per pt   Venom-Honey Bee Other (See Comments)    Hives/ throat closes/swelling Hives/ throat closes/swelling    Bee Pollens Other (See Comments)   Depakote [Divalproex] Hives   Iodinated Contrast Media Other (See Comments)    Tongue swells.    Nsaids (Non-Steroidal Anti-Inflammatory Drug) Other (See Comments)    Bleeding risk, due to gastric bypass    Tegretol [Carbamazepine] Hives     Review of Systems:  A comprehensive 14 point ROS was performed, reviewed by me today, and the pertinent orthopaedic  findings are documented in the HPI.   Exam: BP (!) 137/99   Pulse 71   Temp 36.4 C (97.6 F)   Ht 170.2 cm (5' 7)   Wt 69.9 kg (154 lb)   LMP  (LMP Unknown) Comment: partial hysterectomy  SpO2 98%   BMI 24.12 kg/m  General/Constitutional: The patient appears to be well-nourished, well-developed, and in no acute distress. Neuro/Psych: Normal mood and affect, oriented to person, place and time. Eyes: Non-icteric.  Pupils are equal, round, and reactive to light, and exhibit synchronous movement. ENT: Unremarkable.  No pharyngeal erythema or exudates. Lymphatic: No palpable adenopathy. Respiratory: Non-labored breathing.  No wheezing rales or rhonchi.  Good breath  sounds bilaterally. Cardiovascular: Rate rate and rhythm no edema, swelling or tenderness, except as noted in detailed exam. Integumentary: Left breast at the border of the areola there is signs of a slightly raised mildly erythematous skin lesion.  There is no active bleeding, drainage.  No fluctuance.  She is nontender to palpation.  Appears to be abscess that has drained and improved. Musculoskeletal: Unremarkable, except as noted in detailed exam.  Right index finger with 2 cm laceration along the radial aspect of the MCP.  This is a superficial laceration, no active bleeding.  Wound was clean, cleansed again and Betadine and saline.  No visible or palpable foreign body.  Full active flexion extension and no laxity with valgus varus stress testing of the MCP joint.  Wound was cleansed and dried and an ice thin layer of Dermabond applied.    Impression: Encntr for obs for susp expsr to oth biolg agents ruled out [Z03.818] Encntr for obs for susp expsr to oth biolg agents ruled out  (primary encounter diagnosis) Acute cough Laceration of right index finger without foreign body, nail damage status unspecified, initial encounter Left breast abscess  Plan:  50.  43 year old female with right index finger laceration.  This was  thoroughly cleansed, Tdap updated and she is placed on Doxy for prophylaxis.  A thin layer of Dermabond was applied after soaking in Betadine and saline. 2.  Cough for 2 days, no fevers chills.  Lungs are clear to auscultation.  Vital signs stable.  Suspect viral.  Recommend over-the-counter medications 3.  Chronic wound left breast, recommend follow-up with specialist.  She is on Doxy but I do not feel like this is an active infection versus a chronic wound that needs to be reevaluated by specialist.  Recommend she follow-up with PCP or dermatologist.  She has had a recent mammogram that she states was normal.  This note was generated in part with voice recognition software and I apologize for any typographical errors that were not detected and corrected.   Debby Lonni Amber MPA-C

## 2024-12-03 ENCOUNTER — Emergency Department
Admission: EM | Admit: 2024-12-03 | Discharge: 2024-12-03 | Disposition: A | Discharge status: Home / Self Care | Payer: MEDICAID | Attending: Emergency Medicine | Admitting: Emergency Medicine

## 2024-12-03 ENCOUNTER — Other Ambulatory Visit: Payer: Self-pay

## 2024-12-03 DIAGNOSIS — R051 Acute cough: Secondary | ICD-10-CM

## 2024-12-03 DIAGNOSIS — J101 Influenza due to other identified influenza virus with other respiratory manifestations: Secondary | ICD-10-CM | POA: Insufficient documentation

## 2024-12-03 DIAGNOSIS — R059 Cough, unspecified: Secondary | ICD-10-CM | POA: Diagnosis present

## 2024-12-03 DIAGNOSIS — S81802D Unspecified open wound, left lower leg, subsequent encounter: Secondary | ICD-10-CM | POA: Diagnosis not present

## 2024-12-03 DIAGNOSIS — I10 Essential (primary) hypertension: Secondary | ICD-10-CM | POA: Insufficient documentation

## 2024-12-03 DIAGNOSIS — X58XXXD Exposure to other specified factors, subsequent encounter: Secondary | ICD-10-CM | POA: Insufficient documentation

## 2024-12-03 DIAGNOSIS — S81802A Unspecified open wound, left lower leg, initial encounter: Secondary | ICD-10-CM

## 2024-12-03 LAB — CBC WITH DIFFERENTIAL/PLATELET
Abs Immature Granulocytes: 0.02 K/uL (ref 0.00–0.07)
Basophils Absolute: 0 K/uL (ref 0.0–0.1)
Basophils Relative: 1 %
Eosinophils Absolute: 0.1 K/uL (ref 0.0–0.5)
Eosinophils Relative: 2 %
HCT: 35.1 % — ABNORMAL LOW (ref 36.0–46.0)
Hemoglobin: 11.2 g/dL — ABNORMAL LOW (ref 12.0–15.0)
Immature Granulocytes: 0 %
Lymphocytes Relative: 9 %
Lymphs Abs: 0.6 K/uL — ABNORMAL LOW (ref 0.7–4.0)
MCH: 28.9 pg (ref 26.0–34.0)
MCHC: 31.9 g/dL (ref 30.0–36.0)
MCV: 90.5 fL (ref 80.0–100.0)
Monocytes Absolute: 0.5 K/uL (ref 0.1–1.0)
Monocytes Relative: 7 %
Neutro Abs: 5.7 K/uL (ref 1.7–7.7)
Neutrophils Relative %: 81 %
Platelets: 143 K/uL — ABNORMAL LOW (ref 150–400)
RBC: 3.88 MIL/uL (ref 3.87–5.11)
RDW: 16.5 % — ABNORMAL HIGH (ref 11.5–15.5)
WBC: 6.9 K/uL (ref 4.0–10.5)
nRBC: 0 % (ref 0.0–0.2)

## 2024-12-03 LAB — BASIC METABOLIC PANEL WITH GFR
Anion gap: 13 (ref 5–15)
BUN: <5 mg/dL — ABNORMAL LOW (ref 6–20)
CO2: 24 mmol/L (ref 22–32)
Calcium: 8.9 mg/dL (ref 8.9–10.3)
Chloride: 97 mmol/L — ABNORMAL LOW (ref 98–111)
Creatinine, Ser: 0.43 mg/dL — ABNORMAL LOW (ref 0.44–1.00)
GFR, Estimated: >60 mL/min
Glucose, Bld: 82 mg/dL (ref 70–99)
Potassium: 3.4 mmol/L — ABNORMAL LOW (ref 3.5–5.1)
Sodium: 134 mmol/L — ABNORMAL LOW (ref 135–145)

## 2024-12-03 LAB — RESP PANEL BY RT-PCR (RSV, FLU A&B, COVID)  RVPGX2
Influenza A by PCR: POSITIVE — AB
Influenza B by PCR: NEGATIVE
Resp Syncytial Virus by PCR: NEGATIVE
SARS Coronavirus 2 by RT PCR: NEGATIVE

## 2024-12-03 LAB — GROUP A STREP BY PCR: Group A Strep by PCR: NOT DETECTED

## 2024-12-03 MED ORDER — BACITRACIN 500 UNIT/GM EX OINT: 1.0000 | TOPICAL_OINTMENT | Freq: Two times a day (BID) | CUTANEOUS | 0 refills | Status: AC

## 2024-12-03 MED ORDER — GUAIFENESIN 200 MG PO TABS: 400.0000 mg | ORAL_TABLET | Freq: Four times a day (QID) | ORAL | 0 refills | Status: AC | PRN
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
## 2024-12-03 NOTE — Discharge Instructions (Signed)
 Apply bacitracin  ointment to the wound twice daily, clean it with soap and water, keep it covered.  Return to the ER for new, worsening, or persistent severe redness, swelling, bleeding or drainage from the wound, or any other new or worsening symptoms that concern you.
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
## 2025-01-05 ENCOUNTER — Other Ambulatory Visit: Payer: Self-pay

## 2025-01-05 ENCOUNTER — Emergency Department: Payer: MEDICAID

## 2025-01-05 ENCOUNTER — Ambulatory Visit
Admission: EM | Admit: 2025-01-05 | Discharge: 2025-01-05 | Disposition: A | Discharge status: Home / Self Care | Source: Ambulatory Visit | Attending: Emergency Medicine | Admitting: Emergency Medicine

## 2025-01-05 ENCOUNTER — Other Ambulatory Visit (HOSPITAL_COMMUNITY): Payer: Self-pay

## 2025-01-05 ENCOUNTER — Telehealth: Payer: Self-pay

## 2025-01-05 ENCOUNTER — Emergency Department
Admission: EM | Admit: 2025-01-05 | Discharge: 2025-01-05 | Disposition: A | Discharge status: Home / Self Care | Payer: MEDICAID | Attending: Emergency Medicine | Admitting: Emergency Medicine

## 2025-01-05 DIAGNOSIS — M79641 Pain in right hand: Secondary | ICD-10-CM | POA: Insufficient documentation

## 2025-01-05 DIAGNOSIS — T7421XA Adult sexual abuse, confirmed, initial encounter: Secondary | ICD-10-CM | POA: Diagnosis present

## 2025-01-05 DIAGNOSIS — S20319A Abrasion of unspecified front wall of thorax, initial encounter: Secondary | ICD-10-CM | POA: Diagnosis not present

## 2025-01-05 DIAGNOSIS — Z0441 Encounter for examination and observation following alleged adult rape: Secondary | ICD-10-CM | POA: Insufficient documentation

## 2025-01-05 DIAGNOSIS — N61 Mastitis without abscess: Secondary | ICD-10-CM | POA: Insufficient documentation

## 2025-01-05 LAB — HEPATITIS B SURFACE ANTIGEN: Hepatitis B Surface Ag: NONREACTIVE

## 2025-01-05 LAB — COMPREHENSIVE METABOLIC PANEL WITH GFR
ALT: 19 U/L (ref 0–44)
AST: 28 U/L (ref 15–41)
Albumin: 4 g/dL (ref 3.5–5.0)
Alkaline Phosphatase: 127 U/L — ABNORMAL HIGH (ref 38–126)
Anion gap: 11 (ref 5–15)
BUN: 8 mg/dL (ref 6–20)
CO2: 28 mmol/L (ref 22–32)
Calcium: 8.7 mg/dL — ABNORMAL LOW (ref 8.9–10.3)
Chloride: 103 mmol/L (ref 98–111)
Creatinine, Ser: 0.51 mg/dL (ref 0.44–1.00)
GFR, Estimated: >60 mL/min
Glucose, Bld: 81 mg/dL (ref 70–99)
Potassium: 4 mmol/L (ref 3.5–5.1)
Sodium: 142 mmol/L (ref 135–145)
Total Bilirubin: 0.2 mg/dL (ref 0.0–1.2)
Total Protein: 7.6 g/dL (ref 6.5–8.1)

## 2025-01-05 LAB — RAPID HIV SCREEN (HIV 1/2 AB+AG)
HIV 1/2 Antibodies: NONREACTIVE
HIV-1 P24 Antigen - HIV24: NONREACTIVE

## 2025-01-05 LAB — HEPATITIS C ANTIBODY: HCV Ab: NONREACTIVE

## 2025-01-05 MED ORDER — ACETAMINOPHEN 325 MG PO TABS
650.0000 mg | ORAL_TABLET | Freq: Once | ORAL | Status: AC
  Administered 2025-01-05: 650 mg via ORAL
  Filled 2025-01-05: qty 2

## 2025-01-05 MED ORDER — CEPHALEXIN 500 MG PO CAPS
500.0000 mg | ORAL_CAPSULE | Freq: Three times a day (TID) | ORAL | 0 refills | Status: AC
  Filled 2025-01-05: qty 21, 7d supply, fill #0

## 2025-01-05 MED ORDER — BICTEGRAVIR-EMTRICITAB-TENOFOV 50-200-25 MG PO TABS
1.0000 | ORAL_TABLET | Freq: Every day | ORAL | 0 refills | Status: AC
  Filled 2025-01-05 (×2): qty 30, 30d supply, fill #0

## 2025-01-05 MED ORDER — AZITHROMYCIN 500 MG PO TABS
1000.0000 mg | ORAL_TABLET | Freq: Once | ORAL | Status: AC
  Administered 2025-01-05: 1000 mg via ORAL
  Filled 2025-01-05: qty 2

## 2025-01-05 MED ORDER — BICTEGRAVIR-EMTRICITAB-TENOFOV 50-200-25 MG PO PREPACK
1.0000 | ORAL_TABLET | Freq: Every day | ORAL | Status: DC
  Administered 2025-01-05: 1 via ORAL
  Filled 2025-01-05: qty 1

## 2025-01-05 MED ORDER — LIDOCAINE HCL (PF) 1 % IJ SOLN
1.0000 mL | Freq: Once | INTRAMUSCULAR | Status: AC
  Administered 2025-01-05: 2.1 mL
  Filled 2025-01-05: qty 5

## 2025-01-05 MED ORDER — DOXYCYCLINE HYCLATE 100 MG PO TABS
100.0000 mg | ORAL_TABLET | Freq: Two times a day (BID) | ORAL | 0 refills | Status: AC
  Filled 2025-01-05: qty 14, 7d supply, fill #0

## 2025-01-05 MED ORDER — CEFTRIAXONE SODIUM 1 G IJ SOLR
500.0000 mg | Freq: Once | INTRAMUSCULAR | Status: AC
  Administered 2025-01-05: 500 mg via INTRAMUSCULAR
  Filled 2025-01-05: qty 10

## 2025-01-05 MED ORDER — BICTEGRAVIR-EMTRICITAB-TENOFOV 50-200-25 MG PO TABS
1.0000 | ORAL_TABLET | Freq: Every day | ORAL | 0 refills | Status: DC
  Filled 2025-01-05: qty 30, 30d supply, fill #0

## 2025-01-05 MED ORDER — METRONIDAZOLE 500 MG PO TABS
2000.0000 mg | ORAL_TABLET | Freq: Once | ORAL | 0 refills | Status: AC
  Filled 2025-01-05 (×2): qty 4, 1d supply, fill #0

## 2025-01-05 MED ORDER — METRONIDAZOLE 500 MG PO TABS
2000.0000 mg | ORAL_TABLET | Freq: Once | ORAL | 0 refills | Status: DC
  Filled 2025-01-05: qty 4, 1d supply, fill #0
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
## 2025-01-05 NOTE — Telephone Encounter (Unsigned)
 Copied from CRM #[id number]. Topic: Clinical - Request for Lab/Test Order >> Jan 05, 2025  2:11 PM Antony RAMAN wrote: Reason for CRM: michelle from [facility] calling for verbal orders for this patient for breast imaging [phone]

## 2025-01-05 NOTE — Consult Note (Signed)
The SANE/FNE (Forensic Nurse Examiner) consult has been completed. The primary RN and/or provider have been notified. Please contact the SANE/FNE nurse on call (listed in Amion) with any further concerns.  

## 2025-01-05 NOTE — Discharge Instructions (Addendum)
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 this medication safely.  NOTE: This sheet is a summary. It may not cover all possible information. If you have questions about this medicine, talk to your doctor, pharmacist, or health care provider.  2024 Elsevier/Gold Standard (2022-02-03 00:00:00) Bictegravir; Emtricitabine ; Tenofovir  Alafenamide Tablets  What is this medication? BICTEGRAVIR; EMTRICITABINE ; TENOFOVIR  ALAFENAMIDE (bik TEG ra veer; em tri SIT uh bean; ten OF oh vir AL a FEN a mide) helps manage the symptoms of HIV infection. It works by limiting the spread of HIV in the body. It  is a combination of three antiretroviral medications. This medication is not a cure for HIV or AIDS and it may still be possible to spread HIV to others while taking it. It does not prevent other sexually transmitted infections (STIs). This medicine may be used for other purposes; ask your health care provider or pharmacist if you have questions. COMMON BRAND NAME(S): Biktarvy  What  should I tell my care team before I take this medication? They need to know if you have any of these conditions: Kidney disease Liver disease An unusual or allergic reaction to bictegravir, emtricitabine , tenofovir , other medications, foods, dyes, or preservatives Pregnant or trying to get pregnant Breast-feeding  How should I use this medication? Take this medication by mouth with a glass of water. You can take it with or without food. If it upsets your stomach, take it with food. You may cut the tablet in half. This may help you swallow the tablet if the whole tablet is too big. Be sure to take both halves within 10 minutes. Do not take just one-half of the tablet. For your therapy to work as well as possible, take each dose exactly as prescribed on the prescription label. Do not skip doses. Skipping doses can make HIV resistant to this and other medications. Keep taking this therapy unless your care team tells you to stop. Take antacids with aluminum or magnesium  in them at a different time of day than this medication. Take this medication 2 hours BEFORE or 6 hours AFTER these products. Take products with calcium or iron in them at the same time you take this medication with food. Talk to your care team about the use of this medication in children. While it may be prescribed for children for selected conditions, precautions do apply. Overdosage: If you think you have taken too much of this medicine contact a poison control center or emergency room at once. NOTE: This medicine is only for you. Do not share this medicine with others.  What if I miss a dose? If you miss a dose, take it as soon as you can. If it is almost time for your next dose, take only that dose. Do not take double or extra doses. What may interact with this medication? Do not take this medication with any of the following: Adefovir Any medication that contains lamivudine Dofetilide Rifampin This medication may also  interact with the following: Antacids Certain antibiotics like rifabutin, rifapentine, aminoglycosides Certain medications for seizures like carbamazepine, oxcarbazepine, phenobarbital, phenytoin Medications for viral infection like cidofovir, acyclovir, valacyclovir, ganciclovir, valganciclovir Metformin Non-steroidal antiinflammatory drugs (NSAIDs) St. John's Wort Sucralfate  Supplements containing calcium or iron  This list may not describe all possible interactions. Give your health care provider a list of all the medicines, herbs, non-prescription drugs, or dietary supplements you use. Also tell them if you smoke, drink alcohol , or use illegal drugs. Some items may interact with your medicine. What should I watch for while using this medication? Visit your care team for regular checks on your progress. Tell your care team if your symptoms do not start to get better or if they get worse. You may need blood work done while you are taking this medication. HIV is spread to others through sexual or blood contact. Talk to your care team about how to stop the spread of HIV. If you have hepatitis B, talk to your care team if you plan to stop this medication. The symptoms of hepatitis B may get worse if you stop this  medication.  What side effects may I notice from receiving this medication? Side effects that you should report to your care team as soon as possible: Allergic reactions--skin rash, itching, hives, swelling of the face, lips, tongue, or throat High lactic acid level--muscle pain or cramps, stomach pain, trouble breathing, general discomfort and fatigue Infection--fever, chills, cough, or sore throat Kidney injury--decrease in the amount of urine, swelling of the ankles, hands, or feet Liver injury--right upper belly pain, loss of appetite, nausea, light-colored stool, dark yellow or brown urine, yellowing skin or eyes, unusual weakness or fatigue Side effects that usually do not  require medical attention (report these to your care team if they continue or are bothersome): Diarrhea Headache Nausea This list may not describe all possible side effects. Call your doctor for medical advice about side effects. You may report side effects to FDA at 1-800-FDA-1088.  Where should I keep my medication? Keep out of the reach of children and pets. Bottles: Store below 30 degrees C (86 degrees F). Keep the container tightly closed. Keep this medication in the original container until you are ready to take it. Get rid of any unused medication after the expiration date. Blister Pack: Store at room temperature between 20 and 25 degrees C (68 and 77 degrees F). Keep this medication in the original packaging until you are ready to take it. Get rid of any unused medication after the expiration date. To get rid of medications that are no longer needed or have expired: Take the medication to a medication take-back program. Check with your pharmacy or law enforcement to find a location. If you cannot return the medication, check the label or package insert to see if the medication should be thrown out in the garbage or flushed down the toilet. If you are not sure, ask your care team. If it is safe to put it in the trash, take the medication out of the container. Mix the medication with cat litter, dirt, coffee grounds, or other unwanted substance. Seal the mixture in a bag or container. Put it in the trash.  NOTE: This sheet is a summary. It may not cover all possible information. If you have questions about this medicine, talk to your doctor, pharmacist, or health care provider.  2024 Elsevier/Gold Standard (2021-08-26 00:00:00)
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
## 2025-01-05 NOTE — ED Notes (Signed)
 [facility]  nurse called  per  lucie  RN

## 2025-01-05 NOTE — ED Notes (Signed)
 Katrina Turner called and provided with all needed information. She states she will be here as soon as she can. Will have MD clear pt prior if able to.
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
## 2025-01-06 ENCOUNTER — Other Ambulatory Visit: Payer: Self-pay | Admitting: Physician Assistant

## 2025-01-06 ENCOUNTER — Other Ambulatory Visit (HOSPITAL_COMMUNITY): Payer: Self-pay

## 2025-01-06 DIAGNOSIS — R928 Other abnormal and inconclusive findings on diagnostic imaging of breast: Secondary | ICD-10-CM

## 2025-01-06 LAB — SYPHILIS: RPR W/REFLEX TO RPR TITER AND TREPONEMAL ANTIBODIES, TRADITIONAL SCREENING AND DIAGNOSIS ALGORITHM: RPR Ser Ql: NONREACTIVE

## 2025-01-09 NOTE — Telephone Encounter (Signed)
 Attempted to call breast imaging center to give verbal, no answer so I am assuming they are closed today. Will retry at a later time
